# Patient Record
Sex: Female | Born: 1970
Health system: Southern US, Community
[De-identification: ages and names within clinical notes are randomized; demographics above are authoritative.]

## PROBLEM LIST (undated history)

## (undated) DIAGNOSIS — I959 Hypotension, unspecified: Secondary | ICD-10-CM

## (undated) DIAGNOSIS — E785 Hyperlipidemia, unspecified: Secondary | ICD-10-CM

## (undated) DIAGNOSIS — A159 Respiratory tuberculosis unspecified: Secondary | ICD-10-CM

## (undated) DIAGNOSIS — F419 Anxiety disorder, unspecified: Secondary | ICD-10-CM

## (undated) DIAGNOSIS — B9681 Helicobacter pylori [H. pylori] as the cause of diseases classified elsewhere: Secondary | ICD-10-CM

## (undated) DIAGNOSIS — R87619 Unspecified abnormal cytological findings in specimens from cervix uteri: Secondary | ICD-10-CM

## (undated) DIAGNOSIS — T7840XA Allergy, unspecified, initial encounter: Secondary | ICD-10-CM

## (undated) DIAGNOSIS — M797 Fibromyalgia: Secondary | ICD-10-CM

## (undated) DIAGNOSIS — F329 Major depressive disorder, single episode, unspecified: Secondary | ICD-10-CM

## (undated) DIAGNOSIS — F32A Depression, unspecified: Secondary | ICD-10-CM

## (undated) DIAGNOSIS — K253 Acute gastric ulcer without hemorrhage or perforation: Secondary | ICD-10-CM

## (undated) DIAGNOSIS — IMO0002 Reserved for concepts with insufficient information to code with codable children: Secondary | ICD-10-CM

## (undated) HISTORY — PX: UPPER GI ENDOSCOPY: SHX6162

## (undated) HISTORY — DX: Unspecified abnormal cytological findings in specimens from cervix uteri: R87.619

## (undated) HISTORY — DX: Reserved for concepts with insufficient information to code with codable children: IMO0002

## (undated) HISTORY — DX: Major depressive disorder, single episode, unspecified: F32.9

## (undated) HISTORY — DX: Depression, unspecified: F32.A

## (undated) HISTORY — PX: COLONOSCOPY: SHX174

## (undated) HISTORY — DX: Anxiety disorder, unspecified: F41.9

## (undated) HISTORY — PX: GYNECOLOGIC CRYOSURGERY: SHX857

## (undated) HISTORY — DX: Allergy, unspecified, initial encounter: T78.40XA

## (undated) HISTORY — DX: Respiratory tuberculosis unspecified: A15.9

---

## 1998-10-24 ENCOUNTER — Emergency Department (HOSPITAL_COMMUNITY): Admission: EM | Admit: 1998-10-24 | Discharge: 1998-10-24 | Payer: Self-pay | Admitting: Emergency Medicine

## 1999-08-20 ENCOUNTER — Other Ambulatory Visit: Admission: RE | Admit: 1999-08-20 | Discharge: 1999-08-20 | Payer: Self-pay | Admitting: Obstetrics and Gynecology

## 1999-09-10 ENCOUNTER — Other Ambulatory Visit: Admission: RE | Admit: 1999-09-10 | Discharge: 1999-09-10 | Payer: Self-pay | Admitting: Obstetrics and Gynecology

## 1999-09-10 ENCOUNTER — Encounter (INDEPENDENT_AMBULATORY_CARE_PROVIDER_SITE_OTHER): Payer: Self-pay | Admitting: Specialist

## 2000-01-15 ENCOUNTER — Other Ambulatory Visit: Admission: RE | Admit: 2000-01-15 | Discharge: 2000-01-15 | Payer: Self-pay | Admitting: Obstetrics and Gynecology

## 2000-02-21 ENCOUNTER — Other Ambulatory Visit: Admission: RE | Admit: 2000-02-21 | Discharge: 2000-02-21 | Payer: Self-pay | Admitting: Obstetrics and Gynecology

## 2000-02-21 ENCOUNTER — Encounter (INDEPENDENT_AMBULATORY_CARE_PROVIDER_SITE_OTHER): Payer: Self-pay | Admitting: Specialist

## 2000-03-12 ENCOUNTER — Inpatient Hospital Stay (HOSPITAL_COMMUNITY): Admission: AD | Admit: 2000-03-12 | Discharge: 2000-03-14 | Payer: Self-pay | Admitting: *Deleted

## 2001-06-08 ENCOUNTER — Other Ambulatory Visit: Admission: RE | Admit: 2001-06-08 | Discharge: 2001-06-08 | Payer: Self-pay | Admitting: *Deleted

## 2001-06-10 ENCOUNTER — Encounter: Payer: Self-pay | Admitting: *Deleted

## 2001-06-10 ENCOUNTER — Inpatient Hospital Stay (HOSPITAL_COMMUNITY): Admission: AD | Admit: 2001-06-10 | Discharge: 2001-06-10 | Payer: Self-pay | Admitting: *Deleted

## 2002-06-15 ENCOUNTER — Encounter: Admission: RE | Admit: 2002-06-15 | Discharge: 2002-06-15 | Payer: Self-pay | Admitting: Family Medicine

## 2002-06-22 ENCOUNTER — Encounter: Admission: RE | Admit: 2002-06-22 | Discharge: 2002-06-22 | Payer: Self-pay | Admitting: Family Medicine

## 2002-06-28 ENCOUNTER — Ambulatory Visit (HOSPITAL_COMMUNITY): Admission: RE | Admit: 2002-06-28 | Discharge: 2002-06-28 | Payer: Self-pay | Admitting: Family Medicine

## 2002-07-27 ENCOUNTER — Encounter: Admission: RE | Admit: 2002-07-27 | Discharge: 2002-07-27 | Payer: Self-pay | Admitting: Family Medicine

## 2002-09-05 ENCOUNTER — Encounter: Admission: RE | Admit: 2002-09-05 | Discharge: 2002-09-05 | Payer: Self-pay | Admitting: Family Medicine

## 2002-09-13 ENCOUNTER — Encounter: Payer: Self-pay | Admitting: Family Medicine

## 2002-09-13 ENCOUNTER — Ambulatory Visit (HOSPITAL_COMMUNITY): Admission: RE | Admit: 2002-09-13 | Discharge: 2002-09-13 | Payer: Self-pay | Admitting: Emergency Medicine

## 2002-10-03 ENCOUNTER — Encounter: Admission: RE | Admit: 2002-10-03 | Discharge: 2002-10-03 | Payer: Self-pay | Admitting: Family Medicine

## 2002-10-05 ENCOUNTER — Encounter: Admission: RE | Admit: 2002-10-05 | Discharge: 2002-10-05 | Payer: Self-pay | Admitting: Family Medicine

## 2002-11-04 ENCOUNTER — Encounter: Admission: RE | Admit: 2002-11-04 | Discharge: 2002-11-04 | Payer: Self-pay | Admitting: Family Medicine

## 2002-11-15 ENCOUNTER — Encounter: Admission: RE | Admit: 2002-11-15 | Discharge: 2002-11-15 | Payer: Self-pay | Admitting: Pediatrics

## 2002-12-02 ENCOUNTER — Encounter: Admission: RE | Admit: 2002-12-02 | Discharge: 2002-12-02 | Payer: Self-pay | Admitting: Family Medicine

## 2002-12-09 ENCOUNTER — Encounter: Admission: RE | Admit: 2002-12-09 | Discharge: 2002-12-09 | Payer: Self-pay | Admitting: Family Medicine

## 2002-12-22 ENCOUNTER — Encounter: Admission: RE | Admit: 2002-12-22 | Discharge: 2002-12-22 | Payer: Self-pay | Admitting: Family Medicine

## 2002-12-30 ENCOUNTER — Encounter: Admission: RE | Admit: 2002-12-30 | Discharge: 2002-12-30 | Payer: Self-pay | Admitting: Family Medicine

## 2003-01-02 ENCOUNTER — Inpatient Hospital Stay (HOSPITAL_COMMUNITY): Admission: AD | Admit: 2003-01-02 | Discharge: 2003-01-04 | Payer: Self-pay | Admitting: Family Medicine

## 2003-01-05 ENCOUNTER — Encounter: Admission: RE | Admit: 2003-01-05 | Discharge: 2003-02-04 | Payer: Self-pay | Admitting: Family Medicine

## 2006-03-10 ENCOUNTER — Ambulatory Visit: Payer: Self-pay | Admitting: Sports Medicine

## 2006-03-17 ENCOUNTER — Ambulatory Visit: Payer: Self-pay | Admitting: Sports Medicine

## 2006-04-03 ENCOUNTER — Ambulatory Visit: Payer: Self-pay | Admitting: Family Medicine

## 2006-04-15 ENCOUNTER — Ambulatory Visit: Payer: Self-pay | Admitting: Sports Medicine

## 2006-04-24 ENCOUNTER — Ambulatory Visit (HOSPITAL_COMMUNITY): Admission: RE | Admit: 2006-04-24 | Discharge: 2006-04-24 | Payer: Self-pay | Admitting: Family Medicine

## 2006-05-01 ENCOUNTER — Ambulatory Visit: Payer: Self-pay | Admitting: Sports Medicine

## 2006-05-28 ENCOUNTER — Ambulatory Visit: Payer: Self-pay | Admitting: Family Medicine

## 2006-06-08 ENCOUNTER — Ambulatory Visit: Payer: Self-pay | Admitting: Family Medicine

## 2006-06-30 ENCOUNTER — Ambulatory Visit: Payer: Self-pay | Admitting: Family Medicine

## 2006-07-07 ENCOUNTER — Ambulatory Visit: Payer: Self-pay | Admitting: Family Medicine

## 2006-07-30 ENCOUNTER — Ambulatory Visit: Payer: Self-pay | Admitting: Family Medicine

## 2006-08-17 ENCOUNTER — Ambulatory Visit: Payer: Self-pay | Admitting: Family Medicine

## 2006-09-02 ENCOUNTER — Ambulatory Visit: Payer: Self-pay | Admitting: Family Medicine

## 2006-09-03 ENCOUNTER — Ambulatory Visit: Payer: Self-pay | Admitting: Sports Medicine

## 2006-09-16 ENCOUNTER — Ambulatory Visit: Payer: Self-pay | Admitting: Family Medicine

## 2006-09-24 ENCOUNTER — Ambulatory Visit: Payer: Self-pay | Admitting: Family Medicine

## 2006-09-30 ENCOUNTER — Ambulatory Visit: Payer: Self-pay | Admitting: Obstetrics and Gynecology

## 2006-09-30 ENCOUNTER — Ambulatory Visit: Payer: Self-pay | Admitting: Obstetrics & Gynecology

## 2006-10-01 ENCOUNTER — Encounter (INDEPENDENT_AMBULATORY_CARE_PROVIDER_SITE_OTHER): Payer: Self-pay | Admitting: Specialist

## 2006-10-01 ENCOUNTER — Inpatient Hospital Stay (HOSPITAL_COMMUNITY): Admission: RE | Admit: 2006-10-01 | Discharge: 2006-10-04 | Payer: Self-pay | Admitting: Family Medicine

## 2006-10-01 ENCOUNTER — Ambulatory Visit: Payer: Self-pay | Admitting: Family Medicine

## 2006-10-05 ENCOUNTER — Inpatient Hospital Stay (HOSPITAL_COMMUNITY): Admission: AD | Admit: 2006-10-05 | Discharge: 2006-10-05 | Payer: Self-pay | Admitting: Gynecology

## 2006-11-06 ENCOUNTER — Ambulatory Visit: Payer: Self-pay | Admitting: Family Medicine

## 2006-11-06 ENCOUNTER — Encounter: Payer: Self-pay | Admitting: Family Medicine

## 2008-02-07 ENCOUNTER — Encounter: Admission: RE | Admit: 2008-02-07 | Discharge: 2008-02-07 | Payer: Self-pay | Admitting: Family Medicine

## 2009-11-28 ENCOUNTER — Ambulatory Visit: Payer: Self-pay | Admitting: Obstetrics and Gynecology

## 2010-06-20 ENCOUNTER — Ambulatory Visit (HOSPITAL_COMMUNITY): Admission: RE | Admit: 2010-06-20 | Discharge: 2010-06-20 | Payer: Self-pay | Admitting: Family Medicine

## 2010-11-25 ENCOUNTER — Other Ambulatory Visit: Payer: Self-pay | Admitting: Family Medicine

## 2010-11-26 ENCOUNTER — Ambulatory Visit
Admission: RE | Admit: 2010-11-26 | Discharge: 2010-11-26 | Disposition: A | Discharge status: Home / Self Care | Payer: No Typology Code available for payment source | Source: Ambulatory Visit | Attending: Family Medicine | Admitting: Family Medicine

## 2010-12-23 LAB — POCT PREGNANCY, URINE: Preg Test, Ur: NEGATIVE

## 2011-02-14 NOTE — Discharge Summary (Signed)
NAMECHARNEE, Aimee Figueroa                 ACCOUNT NO.:  [id number]   MEDICAL RECORD NO.:  [id number]          PATIENT TYPE:  INP   LOCATION:  9116                          FACILITY:  [facility]   PHYSICIAN:  Tanya S. Shawnie Pons, M.D.   DATE OF BIRTH:  11/04/1970   DATE OF ADMISSION:  10/01/2006  DATE OF DISCHARGE:  10/04/2006                               DISCHARGE SUMMARY   PRIMARY CARE PHYSICIAN:  Broadus John T. Pamalee Leyden, M.D.   ADMISSION DIAGNOSES:  1. Gravida 6, para 3-0-2-3 female with viable intrauterine pregnancy      at 40-5/7 weeks.  2. Breech presentation.  3. Active labor.   DISCHARGE DIAGNOSES:  1. Term pregnancy, delivered.  2. Breech presentation converted to cephalic, delivered via a primary      low transverse cesarean section.  3. Postpartum anemia.  4. Postpartum hypertension.  5. Postpartum fever.   PROCEDURES:  1. The patient had a primary low transverse cesarean section performed      on October 01, 2006 at 11:15 a.m. by Dr. Kristen Loader.  Please see      the dictated operative report.  Estimated blood loss during the      procedure was 700 mL.  2. The patient had a pelvic ultrasound performed on October 03, 2006      which showed no incisional hematoma.  It did show some subcutaneous      edema.  It also showed a postpartum uterus with echogenic material      in the endometrial canal consistent with blood clots.   LABORATORY DATA:  Admission hemoglobin was 14.1.  Hemoglobin on  postpartum day #1 was 8.9.  This trended down to 8.5, 8.2, then 7.2, and  on the day of discharge was 7.6 and stable.  White blood cell count was  7.6, with a normal platelet count of 159.   MICROBIOLOGY:  The patient had blood cultures obtained.  They were no  growth at 8 x2.  A urine culture obtained was no growth to date.   HISTORY OF PRESENT ILLNESS:  The patient is a 40 year old G6, P3-0-2-3  who was admitted at 40-5/7 weeks based on her last menstrual period.  She had been followed at the  [facility] and was  set up for post-dates surveillance.  She presented to [facility]  on September 30, 2006 at which time a biophysical profile and nonstress  test were performed.  At that time, the baby was found to be in a breech  presentation on the biophysical profile.  She was then scheduled for  elective C-section on October 01, 2006 at 12 o'clock for breech  presentation and post-dates.  However, on the morning of October 01, 2006, she presented to [facility] with strong contractions.  They  were occurring every 3 to 9 minutes.  On her cervical exam at that time,  she was found to be 3, 60, and -3.  She was admitted in early labor, and  we will proceed with her scheduled C-section.   HOSPITAL COURSE:  Problem 1.  Breech presentation.  The patient was  taken to the operating room by Dr. Kristen Loader.  A low transverse  cesarean section was performed at that time.  Please see the dictated  surgical note.  A viable female infant was born with Apgars of 8 at 1  and 9 at 5 minutes.  Birth weight was 8 pounds 0 ounces.  Cord gas was  7.28.  The patient tolerated the procedure well.  Estimated blood loss  was 700 mL.  She was taken to the PACU in stable condition and then  transferred to the floor.  On postoperative day #1, the patient had a  fever of 101.  At that time cultures were obtained, and the patient was  monitored.  No antibiotics were started.  She remained afebrile  throughout the remainder of her hospital course on postoperative day #2  as well as postoperative day #3.  Her white count was 7.6.  Blood  cultures were obtained, and they were no growth to date x2.  A urine  culture was obtained that was negative.  It was felt that this was  likely a postoperative fever secondary to a hematoma and that no  antibiotics were warranted.   Problem 2.  Postpartum anemia.  The patient's hemoglobin upon admission  was 14.  Her first postoperative  hemoglobin was 8.9.  This trended down  to 8.5, 8.2, to 7.2, and on the day of discharge was 7.6.  The patient  was having very minimal lochia which essentially resolved.  An  ultrasound was obtained on postoperative day #2 looking for an  incisional hematoma.  This was negative and only showed subcutaneous  edema but no incisional hematoma.  It did show some blood clots in the  endometrial canal but nothing that warranted further operative  management.  Part of the decline in her hemoglobin was felt to be  secondary to a dilutional component, as she received approximately 2 L  of normal saline fluid on the day in which her blood count dropped from  8.2 to 7.2.  After having not received any IV fluids for over 24 hours,  her hemoglobin had come back up to 7.6, so again, it was felt that this  was likely also dilutional as well.  She had declined blood transfusion  throughout the hospitalization and elected to pursue p.o. iron therapy  and was discharged home on iron sulfate 325 mg p.o. b.i.d.  She was  instructed that if she became increasingly fatigued, had worsening  bleeding, any chest pain, shortness of breath, or syncopal/presyncopal  events to return immediately to [facility] or the [facility].   Problem 3.  Postpartum depression.  The patient was screened in the  hospital for postpartum depression.  She had postpartum depression with  her previous 3 births.  She also had been treated during this pregnancy  with Wellbutrin for a few weeks which she had electively discontinued on  her own accord.  At the present time, she does not want to start any  medical therapy for postpartum depression.  She understands that if she  has anything beyond the normal baby blues that she has experienced, to  call the [facility] immediately, and we will be very aggressive in treating her for postpartum depression.   DISCHARGE MEDICATIONS:  1.  Iron sulfate 325 mg p.o. b.i.d.  2. Senokot-S 2 tablets p.o. q.h.s. p.r.n. constipation.  3. Oxycodone 5 mg tablets,  1 to 2 tablets p.o. q.4h. p.r.n. pain.  4. Motrin 800 mg 1 p.o. q.8h. p.r.n. pain.   DISCHARGE INSTRUCTIONS AND FOLLOWUP:  The patient is instructed with  follow up with the [facility] in 6 weeks for her  postpartum visit.  At that time we will do a pelvic examination.  Barring any abnormalities, the patient has elected to proceed with an  IUD as her form of contraception, and this will be ordered at the 6-week  visit and then placed shortly thereafter.  She is instructed to refrain  from sexual intercourse for the next 6 weeks, to refrain from driving  for the next 10 days.  She is instructed to refrain from lifting  anything heavier than her baby until her postpartum visit in 6 weeks.  She expressed and understanding of these stipulations.     Broadus John T. Pamalee Leyden, MD    ______________________________  Shelbie Proctor. Shawnie Pons, M.D.   Alvia Grove  D:  10/04/2006  T:  10/04/2006  Job:  [id number]

## 2011-02-14 NOTE — Op Note (Signed)
NAME:  Aimee Figueroa, Aimee Figueroa                 ACCOUNT NO.:  [id number]   MEDICAL RECORD NO.:  [id number]          PATIENT TYPE:  INP   LOCATION:                                FACILITY:  [facility]   PHYSICIAN:  Phil D. Okey Dupre, M.D.     DATE OF BIRTH:  [birth year]   DATE OF PROCEDURE:  10/01/2006  DATE OF DISCHARGE:                               OPERATIVE REPORT   PREOPERATIVE DIAGNOSIS:  IUP at 40 weeks and 5 days gestation, breech  presentation.   POSTOPERATIVE DIAGNOSIS:  Term pregnancy delivered via cesarean section,  breech presentation, converted to cephalic presentation.   PROCEDURE:  Primary lower transverse cesarean section.   SURGEON:  Dr. Argentina Donovan.   ASSISTANT:  Paticia Stack, M.D.   ANESTHESIA:  Spinal.   SPECIMENS:  Placenta sent to pathology.   ESTIMATED BLOOD LOSS:  700 mL.   COMPLICATIONS:  None.   FINDINGS:  A female infant in cephalic presentation, Apgars were 8 and  9, birth weight 8 pounds 0 ounces, cord pH 7.28.   INDICATIONS FOR PROCEDURE:  This is a 40 year old gravida 6, para 3-0-2-  3 who presented at 40 weeks 5 days gestation with complaints of  contractions.  The patient had been scheduled for a primary cesarean  section secondary to breech presentation on the prior to delivery.   PROCEDURE:  The patient was taken to the operating room where spinal  anesthesia was found to be adequate.  She was then prepped and draped in  a normal sterile fashion in the dorsal supine position with a leftward  tilt.  The Pfannenstiel skin incision was then made with a scalpel and  carried through to the underlying layer of fascia.  The fascia was  incised in the midline and the incision extended laterally with the Mayo  scissors.  The superior aspect of the fascial incision was then grasped  with the Kocher clamps, elevated and the underlying rectus muscles  dissected off bluntly.  Attention was then turned to the inferior aspect  of the incision which in a similar fashion was  grasped, tented up with  Kocher clamps and the rectus muscles dissected off bluntly.  The rectus  muscles were then separated in the midline and the peritoneum  identified, tented up and entered sharply with Metzenbaum scissors.  The  peritoneal incision was then extended superiorly and inferiorly with  good visualization of the bladder.  The bladder blade was then inserted  and the vesicouterine peritoneum identified, grasped with the pickups  and entered sharply with Metzenbaum scissors.  This incision was then  extended laterally and the bladder flap created digitally.  The bladder  blade was then reinserted and the lower uterine segment incised in a  transverse fashion with a scalpel.  The bladder blade was removed.  The  infant was found to be in the cephalic position and the infant's head  delivered atraumatically.  There was a nuchal cord x1 that was reduced.  The nose and mouth were suctioned and the cord clamped and cut.  The  infant was handed  off to the waiting pediatricians.  Cord blood and cord  gases were obtained.  The placenta was then removed manually.  It was  noted to have a hyper-spiral cord.  The uterus was cleared of all clots  and debris.  The uterine incision was repaired with 1-0 chromic in a  running locked fashion.  Multiple figure-of-eight stitches were used to  reinforce the uterine incision with excellent hemostasis.  The gutters  were cleared of all clots, the incision reinspected with excellent  hemostasis noted.  The fascia was reapproximated with 0 Vicryl in a  running fashion.  The skin was closed with staples.  The patient  tolerated the procedure well.  Sponge, lap and needle counts were  correct x2.  The patient was taken to the recovery room in stable  condition.     ______________________________  Paticia Stack, MD    ______________________________  Javier Glazier Okey Dupre, M.D.    LNJ/MEDQ  D:  10/01/2006  T:  10/01/2006  Job:  [id number]

## 2011-09-10 ENCOUNTER — Other Ambulatory Visit: Payer: Self-pay

## 2011-09-10 DIAGNOSIS — Z331 Pregnant state, incidental: Secondary | ICD-10-CM

## 2011-09-10 NOTE — Progress Notes (Signed)
Prenatal labs done today Aimee Figueroa 

## 2011-09-11 LAB — OBSTETRIC PANEL
Antibody Screen: NEGATIVE
Basophils Relative: 0 % (ref 0–1)
Eosinophils Absolute: 0 10*3/uL (ref 0.0–0.7)
HCT: 37.8 % (ref 36.0–46.0)
Hepatitis B Surface Ag: NEGATIVE
Monocytes Absolute: 0.2 10*3/uL (ref 0.1–1.0)
RBC: 4 MIL/uL (ref 3.87–5.11)
Rh Type: POSITIVE
Rubella: >500 IU/mL — ABNORMAL HIGH
WBC: 5.3 10*3/uL (ref 4.0–10.5)

## 2011-09-11 LAB — HIV ANTIBODY (ROUTINE TESTING W REFLEX): HIV: NONREACTIVE

## 2011-09-12 LAB — CULTURE, OB URINE: Colony Count: NO GROWTH

## 2011-09-17 ENCOUNTER — Encounter: Payer: Self-pay | Admitting: Family Medicine

## 2011-09-17 ENCOUNTER — Other Ambulatory Visit (HOSPITAL_COMMUNITY)
Admission: RE | Admit: 2011-09-17 | Discharge: 2011-09-17 | Disposition: A | Discharge status: Home / Self Care | Payer: Self-pay | Source: Ambulatory Visit | Attending: Family Medicine | Admitting: Family Medicine

## 2011-09-17 ENCOUNTER — Ambulatory Visit (INDEPENDENT_AMBULATORY_CARE_PROVIDER_SITE_OTHER): Payer: Self-pay | Admitting: Family Medicine

## 2011-09-17 VITALS — BP 93/61 | Wt 135.0 lb

## 2011-09-17 DIAGNOSIS — Z01419 Encounter for gynecological examination (general) (routine) without abnormal findings: Secondary | ICD-10-CM | POA: Insufficient documentation

## 2011-09-17 DIAGNOSIS — Z3480 Encounter for supervision of other normal pregnancy, unspecified trimester: Secondary | ICD-10-CM

## 2011-09-17 DIAGNOSIS — Z9889 Other specified postprocedural states: Secondary | ICD-10-CM

## 2011-09-17 DIAGNOSIS — Z331 Pregnant state, incidental: Secondary | ICD-10-CM

## 2011-09-17 DIAGNOSIS — Z98891 History of uterine scar from previous surgery: Secondary | ICD-10-CM

## 2011-09-17 DIAGNOSIS — O09529 Supervision of elderly multigravida, unspecified trimester: Secondary | ICD-10-CM

## 2011-09-17 MED ORDER — ERYTHROMYCIN 5 MG/GM OP OINT: TOPICAL_OINTMENT | Freq: Every day | OPHTHALMIC | Status: AC

## 2011-09-17 NOTE — Progress Notes (Signed)
All prenatal labs and urine reviewed.

## 2011-09-17 NOTE — Progress Notes (Signed)
Addended by: Edd Arbour on: 09/17/2011 11:02 AM   Modules accepted: Kipp Brood

## 2011-09-17 NOTE — Progress Notes (Signed)
New OB S: no complaints. Some GERD. No morning sickness.  O: Filed Vitals:   09/17/11 0952  BP: 93/61  Weight: 135 lb (61.236 kg)  Lungs:  Normal respiratory effort, chest expands symmetrically. Lungs are clear to auscultation, no crackles or wheezes. Abdomen: soft and non-tender without masses, she has a Gravid uterus, organomegaly or hernias noted.  No guarding or rebound Extremities:  No cyanosis, edema, or deformity noted  Skin:  Intact without suspicious lesions or rashes  A/P 14.2 pregnant female 5th pregnancy.  - she declines genetic screening after extensive counseling - she is 40 y/o - she will be set up for a repeat C-Section and tubal ligation. - she plans to breast feed - she will RTC in one month.

## 2011-09-18 LAB — GC/CHLAMYDIA PROBE AMP, GENITAL: Chlamydia, DNA Probe: NEGATIVE

## 2011-09-30 NOTE — L&D Delivery Note (Signed)
Delivery Note At  a viable female delivered via SVD .  Presentation: vertex; Position: face facing left. Mild dystocia noted without nuchal cord.  Delivery of the head:  Right shoulder delivered by hooking finger under axilla and moving head anterior. Baby delivered to mothers chest.  Cord clamped and cut. Cord blood sent.  Weight: 8 lbs 7 oz APGAR: not recorded, 5 minutes: 8; Placenta status: intact, .   Cord: 3 vessel  Anesthesia: Epidural  Episiotomy: none Lacerations: none Suture Repair: none needed Est. Blood Loss (mL): 300 cc  Mom to postpartum.  Baby to nursery-stable.  Edd Arbour MD 03/11/2012, 12:20 PM    Due to precipitous progression, I was not present for the delivery of this patient despite nursing attempt to call me.  I arrived approximately 5 minutes after the delivery.  Patient examined - agree with the above note.  Levie Heritage, DO. 03/11/2012 3:18 PM

## 2011-10-01 DIAGNOSIS — O09529 Supervision of elderly multigravida, unspecified trimester: Secondary | ICD-10-CM | POA: Insufficient documentation

## 2011-10-01 DIAGNOSIS — Z98891 History of uterine scar from previous surgery: Secondary | ICD-10-CM | POA: Insufficient documentation

## 2011-10-01 NOTE — Progress Notes (Signed)
Note reviewed. Pt due for flu shot. Also needs early glucola for +FH diabetes (father). Will need dating ultrasound if she was taking OCPs at conception.

## 2011-10-14 ENCOUNTER — Ambulatory Visit (INDEPENDENT_AMBULATORY_CARE_PROVIDER_SITE_OTHER): Payer: Self-pay | Admitting: Family Medicine

## 2011-10-14 VITALS — BP 105/53 | Wt 140.6 lb

## 2011-10-14 DIAGNOSIS — Z331 Pregnant state, incidental: Secondary | ICD-10-CM

## 2011-10-14 NOTE — Progress Notes (Signed)
S: no complaints. No n/v O: Filed Vitals:   10/14/11 1556  BP: 105/53  Weight: 140 lb 9.6 oz (63.776 kg)  Lungs:  Normal respiratory effort, chest expands symmetrically. Lungs are clear to auscultation, no crackles or wheezes. Abdomen: soft and non-tender without masses, she has a Gravid uterus, organomegaly or hernias noted.  No guarding or rebound Extremities:  No cyanosis, edema, or deformity noted  Skin:  Intact without suspicious lesions or rashes  A/P 18.1 pregnant female 5th pregnancy.  - she declines genetic screening after extensive counseling - she is 41 y/o - she will be set up for a repeat C-Section and tubal ligation. - she plans to breast feed - she will RTC in one month. - 1 hr gtt today - she will think about whether she wants an u/s

## 2011-11-10 ENCOUNTER — Ambulatory Visit (INDEPENDENT_AMBULATORY_CARE_PROVIDER_SITE_OTHER): Payer: Self-pay | Admitting: Family Medicine

## 2011-11-10 VITALS — BP 106/60 | Wt 141.0 lb

## 2011-11-10 DIAGNOSIS — Z3480 Encounter for supervision of other normal pregnancy, unspecified trimester: Secondary | ICD-10-CM

## 2011-11-10 DIAGNOSIS — Z331 Pregnant state, incidental: Secondary | ICD-10-CM

## 2011-11-10 NOTE — Progress Notes (Signed)
S: no complaints. No n/v. Complains of some vaginal engorgement, no infection.  O: Filed Vitals:   11/10/11 0935  BP: 106/60  Weight: 141 lb (63.957 kg)  Lungs:  Normal respiratory effort, chest expands symmetrically. Lungs are clear to auscultation, no crackles or wheezes. Abdomen: soft and non-tender without masses, she has a Gravid uterus, organomegaly or hernias noted.  No guarding or rebound Extremities:  No cyanosis, edema, or deformity noted  Skin:  Intact without suspicious lesions or rashes  A/P 68 pregnant female 5th pregnancy.  - she declines genetic screening after extensive counseling - she is 41 y/o - she will be set up for a repeat C-Section and tubal ligation. - she plans to breast feed - she will RTC in one month. - gtt normal - will schedule anatomy ultrasound

## 2011-12-11 ENCOUNTER — Ambulatory Visit (INDEPENDENT_AMBULATORY_CARE_PROVIDER_SITE_OTHER): Payer: Self-pay | Admitting: Family Medicine

## 2011-12-11 VITALS — BP 104/60 | Wt 146.3 lb

## 2011-12-11 DIAGNOSIS — Z3480 Encounter for supervision of other normal pregnancy, unspecified trimester: Secondary | ICD-10-CM

## 2011-12-11 NOTE — Progress Notes (Signed)
S: no complaints. No n/v. minimal pain.  O: Filed Vitals:   12/11/11 0943  BP: 104/60  Weight: 146 lb 4.8 oz (66.361 kg)  Lungs:  Normal respiratory effort, chest expands symmetrically. Lungs are clear to auscultation, no crackles or wheezes. Abdomen: soft and non-tender without masses, she has a Gravid uterus, organomegaly or hernias noted.  No guarding or rebound Extremities:  No cyanosis, edema, or deformity noted  Skin:  Intact without suspicious lesions or rashes  A/P 26.3 pregnant female 5th pregnancy.  - she declines genetic screening after extensive counseling - she is 41 y/o - she will be set up for a repeat C-Section and tubal ligation. - she plans to breast feed - she will RTC in 2 weeks.  - gtt normal - patient does not want anatomy ultrasound for cost reasons.  - RPR/CBC/HIV ordered for two weeks.   - 1 hour GTT in 2 week.

## 2011-12-16 NOTE — Progress Notes (Signed)
Pt also seems to be due for 1 hour glucola (had early glucola for +FH diabetes, which she passed). Due for Tdap, if she has not received one this pregnancy.

## 2011-12-31 ENCOUNTER — Encounter: Payer: Self-pay | Admitting: Family Medicine

## 2012-01-02 ENCOUNTER — Ambulatory Visit (INDEPENDENT_AMBULATORY_CARE_PROVIDER_SITE_OTHER): Payer: Self-pay | Admitting: Family Medicine

## 2012-01-02 ENCOUNTER — Other Ambulatory Visit: Payer: Self-pay

## 2012-01-02 VITALS — BP 97/55 | Temp 97.8°F | Wt 147.5 lb

## 2012-01-02 DIAGNOSIS — Z3480 Encounter for supervision of other normal pregnancy, unspecified trimester: Secondary | ICD-10-CM

## 2012-01-02 DIAGNOSIS — Z23 Encounter for immunization: Secondary | ICD-10-CM

## 2012-01-02 LAB — CBC
HCT: 36.6 % (ref 36.0–46.0)
Hemoglobin: 11.9 g/dL — ABNORMAL LOW (ref 12.0–15.0)
MCV: 98.4 fL (ref 78.0–100.0)

## 2012-01-02 MED ORDER — ERYTHROMYCIN 5 MG/GM OP OINT: TOPICAL_OINTMENT | Freq: Every day | OPHTHALMIC | Status: AC

## 2012-01-02 NOTE — Progress Notes (Signed)
Addended by: Jimmy Footman K on: 01/02/2012 12:17 PM   Modules accepted: Orders

## 2012-01-02 NOTE — Progress Notes (Signed)
Discussed further with patient. She does not want a c-section or tubal because she cannot afford it. She will attempt vaginal delivery.

## 2012-01-02 NOTE — Progress Notes (Signed)
S: no complaints.  O: Filed Vitals:   01/02/12 0848  BP: 97/55  Temp: 97.8 F (36.6 C)  Weight: 147 lb 8 oz (66.906 kg)  Abdomen: soft and non-tender without masses, she has a Gravid uterus, organomegaly or hernias noted.  No guarding or rebound Extremities:  No cyanosis, edema, or deformity noted  Skin:  Intact without suspicious lesions or rashes  A/P 29.4 pregnant female 5th pregnancy.  - she declines genetic screening after extensive counseling - she is 41 y/o - she will be set up for a repeat C-Section and tubal ligation. - she plans to breast feed - she will RTC in 2 weeks.  - patient does not want anatomy ultrasound for cost reasons.  - RPR/CBC/HIV today  - 1 hour GTT 111, no 3 hour needed. -reviewed prenatal visit schedule for items to do.

## 2012-01-02 NOTE — Progress Notes (Signed)
Addended by: Edd Arbour on: 01/02/2012 09:06 AM   Modules accepted: Orders

## 2012-01-02 NOTE — Progress Notes (Signed)
1 hr done. 3 hr scheduled for 01/06/12.Aimee Figueroa, [name]

## 2012-01-03 LAB — RPR

## 2012-01-03 LAB — HIV ANTIBODY (ROUTINE TESTING W REFLEX): HIV: NONREACTIVE

## 2012-01-06 ENCOUNTER — Other Ambulatory Visit: Payer: Self-pay

## 2012-01-06 DIAGNOSIS — Z331 Pregnant state, incidental: Secondary | ICD-10-CM

## 2012-01-06 LAB — GLUCOSE, CAPILLARY

## 2012-01-06 NOTE — Progress Notes (Unsigned)
3 hr gtt done today Aimee Figueroa 

## 2012-01-07 LAB — GLUCOSE TOLERANCE, 3 HOURS: Glucose Tolerance, Fasting: 80 mg/dL (ref 70–104)

## 2012-01-08 NOTE — Progress Notes (Signed)
Note reviewed. Elevated 1 hour, 3 hour is normal. H/o section - seems to desire TOL.  Has appt 4/17 at 9:30.

## 2012-01-14 ENCOUNTER — Encounter: Payer: Self-pay | Admitting: Family Medicine

## 2012-01-16 ENCOUNTER — Encounter: Payer: Self-pay | Admitting: Family Medicine

## 2012-01-19 ENCOUNTER — Telehealth: Payer: Self-pay | Admitting: Family Medicine

## 2012-01-19 NOTE — Telephone Encounter (Signed)
Pt was told that she was supposed to go to [facility] for an appt and they made the appt, but then called her back to cancel it - says she didn't need to go. Was told that [facility] was supposed to call her and hasn't heard anything else - not sure what to do,

## 2012-01-20 NOTE — Telephone Encounter (Signed)
Pt given the # for the [facility] so she can call [facility] back, states this is regarding talking about her delivery options.

## 2012-01-23 ENCOUNTER — Ambulatory Visit (INDEPENDENT_AMBULATORY_CARE_PROVIDER_SITE_OTHER): Payer: Self-pay | Admitting: Family Medicine

## 2012-01-23 DIAGNOSIS — Z3480 Encounter for supervision of other normal pregnancy, unspecified trimester: Secondary | ICD-10-CM

## 2012-01-23 NOTE — Patient Instructions (Signed)
Use Claritin pills to help with sneezing, itchy eyes, itchy throat  Non medicine things that can help: HEPA air filter in your bedroom, avoiding outdoors, nasal saline irrigation once to twice daily.

## 2012-01-23 NOTE — Progress Notes (Signed)
Work in appt for seasonal allergies  Has history of seasonal allergies, wants to know what she can take in pregnancy. Itchy eyes, itchy nose, itchy throat, sneezing.  Has not tried anything OTC.  PE: alert, NAD, nasal congestion and sniffling.  Allergic conjunctivitis.  A/P Seasonal allergies- advised claritin, nasal saline irrigation ,and environmental control.

## 2012-01-29 ENCOUNTER — Ambulatory Visit (INDEPENDENT_AMBULATORY_CARE_PROVIDER_SITE_OTHER): Payer: Self-pay | Admitting: Family Medicine

## 2012-01-29 VITALS — BP 102/59 | Temp 97.1°F | Wt 149.7 lb

## 2012-01-29 DIAGNOSIS — Z3480 Encounter for supervision of other normal pregnancy, unspecified trimester: Secondary | ICD-10-CM

## 2012-01-29 MED ORDER — LORATADINE 10 MG PO TABS: 10.0000 mg | ORAL_TABLET | Freq: Every day | ORAL | Status: DC

## 2012-01-29 NOTE — Progress Notes (Signed)
S: allergic rhinitis  O: Filed Vitals:   01/29/12 1057  BP: 102/59  Temp: 97.1 F (36.2 C)  Weight: 149 lb 11.2 oz (67.903 kg)  Abdomen: soft and non-tender without masses, she has a Gravid uterus, organomegaly or hernias noted.  No guarding or rebound Extremities:  No cyanosis, edema, or deformity noted  Skin:  Intact without suspicious lesions or rashes  A/P 33.3 pregnant female 5th pregnancy.  - she will be set up for a repeat C-Section and tubal ligation. - she plans to breast feed - she will RTC in 2 weeks.  -reviewed prenatal visit schedule for items to do.

## 2012-02-03 ENCOUNTER — Encounter: Payer: Self-pay | Admitting: Family Medicine

## 2012-02-03 DIAGNOSIS — Z98891 History of uterine scar from previous surgery: Secondary | ICD-10-CM

## 2012-02-03 NOTE — Patient Instructions (Signed)

## 2012-02-03 NOTE — Progress Notes (Signed)
Patient ID: Aimee Figueroa, female   DOB: [birth year], 41 y.o.   MRN: [id number] Pt. Is a 41 y.o. [id number] 2 [redacted]w[redacted]d with 3 prev. SVD and on LTCS for breech at term. After lengthy discussion, pt. Desires TOLAC. 41 y.o. at [redacted]w[redacted]d with Estimated Date of Delivery: 03/15/12 was seen today in office to discuss VBAC versus repeat cesarean section.   The following risks were discussed with the patient.  Risk of uterine rupture at term is 0.78 percent with TOLAC and 0.22 percent with ERCD. 1 in 10 uterine ruptures will result in neonatal death or neurological injury. The benefits of a trial of labor after cesarean (TOLAC) resulting in a vaginal birth after cesarean (VBAC) include the following: shorter length of hospital stay and postpartum recovery (in most cases); fewer complications, such as postpartum fever, wound or uterine infection, thromboembolism (blood clots in the leg or lung), need for blood transfusion and fewer neonatal breathing problems. The risks of an attempted VBAC or TOLAC include the following: Risk of failed trial of labor after cesarean (TOLAC) without a vaginal birth after cesarean (VBAC) resulting in repeat cesarean delivery (RCD) in about 20 to 40 percent of women who attempt VBAC.  Risk of rupture of uterus resulting in an emergency cesarean delivery. The risk of uterine rupture may be related in part to the type of uterine incision made during the first cesarean delivery. A previous transverse uterine incision has the lowest risk of rupture (0.2 to 1.5 percent risk). Vertical or T-shaped uterine incisions have a higher risk of uterine rupture (4 to 9 percent risk)The risk of fetal death is very low with both VBAC and elective repeat cesarean delivery (ERCD), but the likelihood of fetal death is higher with VBAC than with ERCD. Maternal death is very rare with either type of delivery.  The risks of a repeat cesarean section were reviewed with the patient including but not limited to: 10/998 risk  of uterine rupture which could have serious consequences, bleeding which may require transfusion; infection which may require antibiotics; injury to bowel, bladder or other surrounding organs (bowel, bladder, ureters); injury to the fetus; need for additional procedures including hysterectomy in the event of a life-threatening hemorrhage; thromboembolic phenomenon, incisional problems and other postoperative/anesthesia complications.    All her questions answered and she signed a consent indicating a preference for TOLAC.  She desires 10 year IUD after delivery, may schedule in our clinic.

## 2012-02-12 ENCOUNTER — Ambulatory Visit (INDEPENDENT_AMBULATORY_CARE_PROVIDER_SITE_OTHER): Payer: Self-pay | Admitting: Family Medicine

## 2012-02-12 DIAGNOSIS — Z3480 Encounter for supervision of other normal pregnancy, unspecified trimester: Secondary | ICD-10-CM

## 2012-02-12 MED ORDER — FLUTICASONE PROPIONATE 50 MCG/ACT NA SUSP: 1.0000 | Freq: Every day | NASAL | Status: DC

## 2012-02-12 MED ORDER — PSEUDOEPHEDRINE HCL 60 MG PO TABS: 30.0000 mg | ORAL_TABLET | ORAL | Status: AC | PRN

## 2012-02-12 NOTE — Progress Notes (Signed)
S: allergic rhinitis - still having congestion.   O: Filed Vitals:   02/12/12 0909  BP: 97/54  Temp: 97.6 F (36.4 C)  Weight: 68.221 kg (150 lb 6.4 oz)  Abdomen: soft and non-tender without masses, she has a Gravid uterus, organomegaly or hernias noted.  No guarding or rebound Leopolds: head down.  Extremities:  No cyanosis, edema, or deformity noted  Skin:  Intact without suspicious lesions or rashes  A/P 35.3 pregnant female 5th pregnancy.  - Saw Dr. Shawnie Pons, plan is to try VBAC and for Paraguard vs. Tubal ligation.  - she plans to breast feed - she will RTC in 2 weeks.  -reviewed prenatal visit schedule for items to do.  - GBS, gc/cl/HIV/RPR next visit @ 37 weeks.  - flonase and short course of pseudafed.

## 2012-02-26 ENCOUNTER — Ambulatory Visit (INDEPENDENT_AMBULATORY_CARE_PROVIDER_SITE_OTHER): Payer: Self-pay | Admitting: Family Medicine

## 2012-02-26 VITALS — BP 106/63 | Temp 97.8°F | Wt 151.2 lb

## 2012-02-26 DIAGNOSIS — Z3480 Encounter for supervision of other normal pregnancy, unspecified trimester: Secondary | ICD-10-CM

## 2012-02-26 NOTE — Progress Notes (Signed)
S: patient somewhat tearful today, she is worried about the pain of pregnancy and worried that the baby may have something wrong with it because she didn't want to have the baby. She has a history of peri-partum and postpartum depression.   O: Filed Vitals:   02/26/12 0955  BP: 106/63  Temp: 97.8 F (36.6 C)  Weight: 151 lb 3.2 oz (68.584 kg)  Abdomen: soft and non-tender without masses, she has a Gravid uterus, organomegaly or hernias noted.  No guarding or rebound Ultrasound done today: head down.  Extremities:  No cyanosis, edema, or deformity noted  Skin:  Pruritic rash on both lower ext.   A/P 28.3 pregnant female 5th pregnancy.  - Saw Dr. Shawnie Pons, plan is to try VBAC and for Paraguard vs. Tubal ligation.  - she plans to breast feed - she will RTC in 1 weeks.  -reviewed prenatal visit schedule for items to do.  - GBS, gc/cl/HIV/RPR next visit @ 38 weeks patient did not have time today.  - Peripartum depression, does not want medication. Will follow.

## 2012-02-26 NOTE — Patient Instructions (Signed)
My telephone number: [phone]  Call me if:  Heavy contractions that are close together Vaginal bleeding Baby is not moving Water breaks If you think you need to go to the hospital.   See you next week.

## 2012-03-01 ENCOUNTER — Other Ambulatory Visit (HOSPITAL_COMMUNITY)
Admission: RE | Admit: 2012-03-01 | Discharge: 2012-03-01 | Disposition: A | Discharge status: Home / Self Care | Payer: Self-pay | Source: Ambulatory Visit | Attending: Family Medicine | Admitting: Family Medicine

## 2012-03-01 ENCOUNTER — Ambulatory Visit (INDEPENDENT_AMBULATORY_CARE_PROVIDER_SITE_OTHER): Payer: Self-pay | Admitting: Family Medicine

## 2012-03-01 VITALS — BP 106/67 | Temp 98.0°F | Wt 153.0 lb

## 2012-03-01 DIAGNOSIS — Z3480 Encounter for supervision of other normal pregnancy, unspecified trimester: Secondary | ICD-10-CM

## 2012-03-01 DIAGNOSIS — Z113 Encounter for screening for infections with a predominantly sexual mode of transmission: Secondary | ICD-10-CM | POA: Insufficient documentation

## 2012-03-01 NOTE — Progress Notes (Signed)
Addended by: Garen Grams F on: 03/01/2012 11:03 AM   Modules accepted: Orders

## 2012-03-01 NOTE — Progress Notes (Signed)
S: doing well, no complaints.   O: Filed Vitals:   03/01/12 1044  BP: 106/67  Temp: 98 F (36.7 C)  Weight: 153 lb (69.4 kg)  Abdomen: soft and non-tender without masses, she has a Gravid uterus, organomegaly or hernias noted.  No guarding or rebound Extremities:  No cyanosis, edema, or deformity noted  Skin:  Pruritic rash on both lower ext.   A/P [redacted] week pregnant female 5th pregnancy.  - Saw Dr. Shawnie Pons, plan is to try VBAC and for Paraguard vs. Tubal ligation.  - she plans to breast feed - she will RTC in 1 week. -reviewed prenatal visit schedule for items to do.  - GBS, gc/cl/HIV/RPR today.

## 2012-03-04 ENCOUNTER — Encounter: Payer: Self-pay | Admitting: Family Medicine

## 2012-03-05 ENCOUNTER — Encounter (HOSPITAL_COMMUNITY): Payer: Self-pay | Admitting: *Deleted

## 2012-03-05 ENCOUNTER — Inpatient Hospital Stay (HOSPITAL_COMMUNITY)
Admission: AD | Admit: 2012-03-05 | Discharge: 2012-03-05 | Disposition: A | Discharge status: Home / Self Care | Payer: Self-pay | Source: Ambulatory Visit | Attending: Obstetrics & Gynecology | Admitting: Obstetrics & Gynecology

## 2012-03-05 DIAGNOSIS — O479 False labor, unspecified: Secondary | ICD-10-CM | POA: Insufficient documentation

## 2012-03-05 DIAGNOSIS — O09529 Supervision of elderly multigravida, unspecified trimester: Secondary | ICD-10-CM

## 2012-03-05 DIAGNOSIS — Z98891 History of uterine scar from previous surgery: Secondary | ICD-10-CM

## 2012-03-05 NOTE — MAU Note (Signed)
SAYS  UC ARE 15 MIN APART- STARTED HURTING BAD AT 0500.  NO  VE .  DENIES HSV AND MRSA

## 2012-03-05 NOTE — Discharge Instructions (Signed)

## 2012-03-09 ENCOUNTER — Ambulatory Visit (INDEPENDENT_AMBULATORY_CARE_PROVIDER_SITE_OTHER): Payer: Self-pay | Admitting: Family Medicine

## 2012-03-09 VITALS — BP 90/68 | Temp 98.5°F | Wt 152.0 lb

## 2012-03-09 DIAGNOSIS — Z3480 Encounter for supervision of other normal pregnancy, unspecified trimester: Secondary | ICD-10-CM

## 2012-03-09 NOTE — Patient Instructions (Signed)
I will see you back in one week.  Call me or go to the hospital if: You have vaginal bleeding Your baby is not moving You have water break You have contractions every 5 minutes that are painful

## 2012-03-09 NOTE — Progress Notes (Signed)
S: doing well, having contractions every 25 minutes.   O: Filed Vitals:   03/09/12 0846  BP: 90/68  Temp: 98.5 F (36.9 C)  Weight: 152 lb (68.947 kg)  Abdomen: soft and non-tender without masses, she has a Gravid uterus, organomegaly or hernias noted.  No guarding or rebound Extremities:  No cyanosis, edema, or deformity noted  Skin:  wnl Speculum vaginal: head felt, 1.5/20%/-2 A/P 39.[redacted] week pregnant female 5th pregnancy.  - Saw Dr. Shawnie Pons, plan is to try VBAC and for Paraguard vs. Tubal ligation.  - she plans to breast feed - she will RTC in 1 week. -reviewed prenatal visit schedule for items to do.  - GBS, gc/cl/HIV/RPR done

## 2012-03-10 ENCOUNTER — Encounter (HOSPITAL_COMMUNITY): Payer: Self-pay | Admitting: *Deleted

## 2012-03-10 ENCOUNTER — Inpatient Hospital Stay (HOSPITAL_COMMUNITY)
Admission: AD | Admit: 2012-03-10 | Discharge: 2012-03-13 | DRG: 775 | Disposition: A | Discharge status: Home / Self Care | Payer: Medicaid Other | Source: Ambulatory Visit | Attending: Obstetrics & Gynecology | Admitting: Obstetrics & Gynecology

## 2012-03-10 DIAGNOSIS — O09529 Supervision of elderly multigravida, unspecified trimester: Secondary | ICD-10-CM | POA: Diagnosis present

## 2012-03-10 DIAGNOSIS — O34219 Maternal care for unspecified type scar from previous cesarean delivery: Principal | ICD-10-CM | POA: Diagnosis present

## 2012-03-10 DIAGNOSIS — IMO0001 Reserved for inherently not codable concepts without codable children: Secondary | ICD-10-CM

## 2012-03-10 NOTE — Progress Notes (Signed)
Pt states that she has depression every time she is pregnant. Pt states she feel like she doesn't want them but when she has them she feels like she is a good mother

## 2012-03-10 NOTE — MAU Note (Signed)
Pt states she has been having contractions since 03/05/12. Pain became worse today

## 2012-03-11 ENCOUNTER — Encounter (HOSPITAL_COMMUNITY): Payer: Self-pay | Admitting: Advanced Practice Midwife

## 2012-03-11 ENCOUNTER — Encounter (HOSPITAL_COMMUNITY): Payer: Self-pay | Admitting: Anesthesiology

## 2012-03-11 ENCOUNTER — Inpatient Hospital Stay (HOSPITAL_COMMUNITY): Payer: Medicaid Other | Admitting: Anesthesiology

## 2012-03-11 DIAGNOSIS — O34219 Maternal care for unspecified type scar from previous cesarean delivery: Secondary | ICD-10-CM

## 2012-03-11 DIAGNOSIS — O09529 Supervision of elderly multigravida, unspecified trimester: Secondary | ICD-10-CM

## 2012-03-11 LAB — CBC
Hemoglobin: 13.1 g/dL (ref 12.0–15.0)
MCHC: 34.3 g/dL (ref 30.0–36.0)
Platelets: 124 10*3/uL — ABNORMAL LOW (ref 150–400)
RDW: 14.5 % (ref 11.5–15.5)

## 2012-03-11 LAB — SYPHILIS: RPR W/REFLEX TO RPR TITER AND TREPONEMAL ANTIBODIES, TRADITIONAL SCREENING AND DIAGNOSIS ALGORITHM: RPR Ser Ql: NONREACTIVE

## 2012-03-11 MED ORDER — SODIUM BICARBONATE 8.4 % IV SOLN
INTRAVENOUS | Status: DC | PRN
  Administered 2012-03-11: 4 mL via EPIDURAL

## 2012-03-11 MED ORDER — TETANUS-DIPHTH-ACELL PERTUSSIS 5-2.5-18.5 LF-MCG/0.5 IM SUSP: 0.5000 mL | Freq: Once | INTRAMUSCULAR | Status: DC

## 2012-03-11 MED ORDER — DIPHENHYDRAMINE HCL 50 MG/ML IJ SOLN: 12.5000 mg | INTRAMUSCULAR | Status: DC | PRN

## 2012-03-11 MED ORDER — IBUPROFEN 600 MG PO TABS: 600.0000 mg | ORAL_TABLET | Freq: Four times a day (QID) | ORAL | Status: DC | PRN

## 2012-03-11 MED ORDER — EPHEDRINE 5 MG/ML INJ: 10.0000 mg | INTRAVENOUS | Status: DC | PRN

## 2012-03-11 MED ORDER — LIDOCAINE HCL (PF) 1 % IJ SOLN
30.0000 mL | INTRAMUSCULAR | Status: DC | PRN
  Filled 2012-03-11: qty 30

## 2012-03-11 MED ORDER — OXYCODONE-ACETAMINOPHEN 5-325 MG PO TABS
1.0000 | ORAL_TABLET | ORAL | Status: DC | PRN
  Administered 2012-03-12 (×2): 1 via ORAL
  Filled 2012-03-11 (×3): qty 1

## 2012-03-11 MED ORDER — BENZOCAINE-MENTHOL 20-0.5 % EX AERO: 1.0000 "application " | INHALATION_SPRAY | CUTANEOUS | Status: DC | PRN

## 2012-03-11 MED ORDER — NALBUPHINE SYRINGE 5 MG/0.5 ML
5.0000 mg | INJECTION | INTRAMUSCULAR | Status: DC | PRN
  Administered 2012-03-11 (×2): 5 mg via INTRAVENOUS
  Filled 2012-03-11 (×2): qty 0.5

## 2012-03-11 MED ORDER — FLEET ENEMA 7-19 GM/118ML RE ENEM: 1.0000 | ENEMA | RECTAL | Status: DC | PRN

## 2012-03-11 MED ORDER — PRENATAL MULTIVITAMIN CH
1.0000 | ORAL_TABLET | Freq: Every day | ORAL | Status: DC
  Administered 2012-03-12: 1 via ORAL
  Filled 2012-03-11 (×2): qty 1

## 2012-03-11 MED ORDER — PHENYLEPHRINE 40 MCG/ML (10ML) SYRINGE FOR IV PUSH (FOR BLOOD PRESSURE SUPPORT): 80.0000 ug | PREFILLED_SYRINGE | INTRAVENOUS | Status: DC | PRN

## 2012-03-11 MED ORDER — OXYTOCIN BOLUS FROM INFUSION
500.0000 mL | Freq: Once | INTRAVENOUS | Status: DC
  Filled 2012-03-11: qty 1000
  Filled 2012-03-11: qty 500

## 2012-03-11 MED ORDER — ZOLPIDEM TARTRATE 5 MG PO TABS: 5.0000 mg | ORAL_TABLET | Freq: Every evening | ORAL | Status: DC | PRN

## 2012-03-11 MED ORDER — EPHEDRINE 5 MG/ML INJ
10.0000 mg | INTRAVENOUS | Status: DC | PRN
  Filled 2012-03-11: qty 4

## 2012-03-11 MED ORDER — LACTATED RINGERS IV SOLN
500.0000 mL | Freq: Once | INTRAVENOUS | Status: AC
  Administered 2012-03-11: 500 mL via INTRAVENOUS

## 2012-03-11 MED ORDER — FENTANYL 2.5 MCG/ML BUPIVACAINE 1/10 % EPIDURAL INFUSION (WH - ANES)
14.0000 mL/h | INTRAMUSCULAR | Status: DC
  Filled 2012-03-11: qty 60

## 2012-03-11 MED ORDER — IBUPROFEN 600 MG PO TABS
600.0000 mg | ORAL_TABLET | Freq: Four times a day (QID) | ORAL | Status: DC
  Administered 2012-03-11 – 2012-03-13 (×9): 600 mg via ORAL
  Filled 2012-03-11 (×9): qty 1

## 2012-03-11 MED ORDER — ONDANSETRON HCL 4 MG/2ML IJ SOLN: 4.0000 mg | INTRAMUSCULAR | Status: DC | PRN

## 2012-03-11 MED ORDER — HYDROXYZINE HCL 50 MG/ML IM SOLN: 50.0000 mg | Freq: Four times a day (QID) | INTRAMUSCULAR | Status: DC | PRN

## 2012-03-11 MED ORDER — CITALOPRAM HYDROBROMIDE 10 MG PO TABS
10.0000 mg | ORAL_TABLET | Freq: Every day | ORAL | Status: DC
  Administered 2012-03-11 – 2012-03-13 (×3): 10 mg via ORAL
  Filled 2012-03-11 (×4): qty 1

## 2012-03-11 MED ORDER — FENTANYL 2.5 MCG/ML BUPIVACAINE 1/10 % EPIDURAL INFUSION (WH - ANES)
INTRAMUSCULAR | Status: DC | PRN
  Administered 2012-03-11: 13 mL/h via EPIDURAL

## 2012-03-11 MED ORDER — SENNOSIDES-DOCUSATE SODIUM 8.6-50 MG PO TABS
2.0000 | ORAL_TABLET | Freq: Every day | ORAL | Status: DC
  Administered 2012-03-11 – 2012-03-12 (×2): 2 via ORAL

## 2012-03-11 MED ORDER — LACTATED RINGERS IV SOLN
INTRAVENOUS | Status: DC
  Administered 2012-03-11 (×2): via INTRAVENOUS

## 2012-03-11 MED ORDER — ONDANSETRON HCL 4 MG PO TABS: 4.0000 mg | ORAL_TABLET | ORAL | Status: DC | PRN

## 2012-03-11 MED ORDER — OXYTOCIN 20 UNITS IN LACTATED RINGERS INFUSION - SIMPLE: 125.0000 mL/h | Freq: Once | INTRAVENOUS | Status: DC

## 2012-03-11 MED ORDER — PHENYLEPHRINE 40 MCG/ML (10ML) SYRINGE FOR IV PUSH (FOR BLOOD PRESSURE SUPPORT)
80.0000 ug | PREFILLED_SYRINGE | INTRAVENOUS | Status: DC | PRN
  Filled 2012-03-11: qty 5

## 2012-03-11 MED ORDER — ONDANSETRON HCL 4 MG/2ML IJ SOLN: 4.0000 mg | Freq: Four times a day (QID) | INTRAMUSCULAR | Status: DC | PRN

## 2012-03-11 MED ORDER — SODIUM CHLORIDE 0.9 % IJ SOLN: 3.0000 mL | INTRAMUSCULAR | Status: DC | PRN

## 2012-03-11 MED ORDER — SIMETHICONE 80 MG PO CHEW: 80.0000 mg | CHEWABLE_TABLET | ORAL | Status: DC | PRN

## 2012-03-11 MED ORDER — WITCH HAZEL-GLYCERIN EX PADS: 1.0000 "application " | MEDICATED_PAD | CUTANEOUS | Status: DC | PRN

## 2012-03-11 MED ORDER — HYDROXYZINE HCL 50 MG PO TABS: 50.0000 mg | ORAL_TABLET | Freq: Four times a day (QID) | ORAL | Status: DC | PRN

## 2012-03-11 MED ORDER — CITRIC ACID-SODIUM CITRATE 334-500 MG/5ML PO SOLN: 30.0000 mL | ORAL | Status: DC | PRN

## 2012-03-11 MED ORDER — LANOLIN HYDROUS EX OINT: TOPICAL_OINTMENT | CUTANEOUS | Status: DC | PRN

## 2012-03-11 MED ORDER — DIBUCAINE 1 % RE OINT: 1.0000 "application " | TOPICAL_OINTMENT | RECTAL | Status: DC | PRN

## 2012-03-11 MED ORDER — LACTATED RINGERS IV SOLN: 500.0000 mL | INTRAVENOUS | Status: DC | PRN

## 2012-03-11 MED ORDER — DIPHENHYDRAMINE HCL 25 MG PO CAPS: 25.0000 mg | ORAL_CAPSULE | Freq: Four times a day (QID) | ORAL | Status: DC | PRN

## 2012-03-11 NOTE — Anesthesia Preprocedure Evaluation (Signed)
Anesthesia Evaluation  Patient identified by MRN, date of birth, ID band Patient awake    Reviewed: Allergy & Precautions, H&P , Patient's Chart, lab work & pertinent test results  Airway Mallampati: II TM Distance: >3 FB Neck ROM: full    Dental  (+) Teeth Intact   Pulmonary  breath sounds clear to auscultation        Cardiovascular Rhythm:regular Rate:Normal     Neuro/Psych    GI/Hepatic   Endo/Other    Renal/GU      Musculoskeletal   Abdominal   Peds  Hematology   Anesthesia Other Findings    plts 124; no PIH   Reproductive/Obstetrics (+) Pregnancy                           Anesthesia Physical Anesthesia Plan  ASA: II  Anesthesia Plan: Epidural   Post-op Pain Management:    Induction:   Airway Management Planned:   Additional Equipment:   Intra-op Plan:   Post-operative Plan:   Informed Consent: I have reviewed the patients History and Physical, chart, labs and discussed the procedure including the risks, benefits and alternatives for the proposed anesthesia with the patient or authorized representative who has indicated his/her understanding and acceptance.   Dental Advisory Given  Plan Discussed with:   Anesthesia Plan Comments: (Labs checked- platelets confirmed with RN in room. Fetal heart tracing, per RN, reported to be stable enough for sitting procedure. Discussed epidural, and patient consents to the procedure:  included risk of possible headache,backache, failed block, allergic reaction, and nerve injury. This patient was asked if she had any questions or concerns before the procedure started. )        Anesthesia Quick Evaluation

## 2012-03-11 NOTE — Progress Notes (Signed)
Aimee Figueroa is a 41 y.o. [id number] at [redacted]w[redacted]d.  Subjective: Decreased urge to push w/ nubain.  Objective: BP 84/54  Pulse 72  Temp 98.4 F (36.9 C) (Oral)  Resp 20  SpO2 97%  LMP 06/09/2011      FHT:  FHR: 150 bpm, variability: moderate,  accelerations:  Present,  decelerations:  Absent UC:   regular, every 2 minutes SVE:   Dilation: 7 Effacement (%): 90 Station: 0 Exam by:: Ace Gins, RN  Labs: Lab Results  Component Value Date   WBC 6.7 03/11/2012   HGB 13.1 03/11/2012   HCT 38.2 03/11/2012   MCV 93.6 03/11/2012   PLT 124* 03/11/2012    Assessment / Plan: Spontaneous labor, progressing normally  Labor: Progressing normally Preeclampsia:  NA Fetal Wellbeing:  Category I Pain Control:  Nubain I/D:  n/a Anticipated MOD:  NSVD Premature urge to push. Swollen cervix.  Aimee Figueroa 03/11/2012, 6:37 AM

## 2012-03-11 NOTE — Progress Notes (Signed)
I was present for the exam and agree with above.  [place], CNM 03/11/2012 9:21 AM

## 2012-03-11 NOTE — Progress Notes (Signed)
Operative Delivery Note At  a viable female delivered via SVD .  Presentation: vertex; Position: face facing left. Mild dystocia noted without nuchal cord.  Delivery of the head:  Right shoulder delivered by hooking finger under axilla and moving head anterior. Baby delivered to mothers chest.  Cord clamped and cut. Cord blood sent.  Weight: 8 lbs 7 oz APGAR: not recorded, 5 minutes: 8; Placenta status: intact, .   Cord: 3 vessel  Anesthesia: Epidural  Episiotomy: none Lacerations: none Suture Repair: none needed Est. Blood Loss (mL): 300 cc  Mom to postpartum.  Baby to nursery-stable.  Edd Arbour MD 03/11/2012, 12:20 PM    Due to precipitous progression, I was not present for the delivery of this patient despite nursing attempt to call me.  I arrived approximately 5 minutes after the delivery.  Patient examined - agree with the above note.  Levie Heritage, DO. 03/11/2012 3:18 PM

## 2012-03-11 NOTE — Progress Notes (Signed)

## 2012-03-11 NOTE — Progress Notes (Signed)
Aimee Figueroa is a 41 y.o. [id number] at [redacted]w[redacted]d by ultrasound admitted for active labor  Subjective: No complaints. Comfortable with epidural.   Objective: BP 117/61  Pulse 101  Temp 99.2 F (37.3 C) (Oral)  Resp 20  SpO2 98%  LMP 06/09/2011   Total I/O In: -  Out: 350 [Urine:350]  FHT:  FHR: 140's bpm, variability: minimal ,  accelerations:  Abscent,  decelerations:  Absent UC:   regular, every 4 minutes SVE:   Dilation: Lip/rim Effacement (%): 90;100 Station: -1;+1 Exam by:: Edd Arbour MD  Labs: Lab Results  Component Value Date   WBC 6.7 03/11/2012   HGB 13.1 03/11/2012   HCT 38.2 03/11/2012   MCV 93.6 03/11/2012   PLT 124* 03/11/2012    Assessment / Plan: Spontaneous labor, progressing normally  Labor: Progressing normally Preeclampsia:  no signs or symptoms of toxicity Fetal Wellbeing:  Category I Pain Control:  Epidural I/D:  n/a Anticipated MOD:  NSVD  Deni Lefever MD 03/11/2012, 11:04 AM

## 2012-03-11 NOTE — Progress Notes (Signed)
Aimee Figueroa is a 41 y.o. [id number] at [redacted]w[redacted]d by LMP admitted for regular contractions and cervical dilation.  Subjective: Pt tolerating labor. Rupture of membranes tolerated.  Objective: BP 108/53  Pulse 78  Temp 98.4 F (36.9 C) (Oral)  Resp 20  LMP 06/09/2011      FHT:  FHR: 150 bpm, variability: moderate,  accelerations:  Present,  decelerations:  Absent UC:   regular, every 2-3 minutes SVE:   Dilation: 7 Effacement (%): 90 Station: 0 Exam by:: Ivonne Andrew, CNM AROM performed  Dilation: 7 Effacement (%): 90 Cervical Position: Middle Station: 0 Presentation: Vertex Exam by:: Ivonne Andrew, CNM  Labs: Lab Results  Component Value Date   WBC 6.7 03/11/2012   HGB 13.1 03/11/2012   HCT 38.2 03/11/2012   MCV 93.6 03/11/2012   PLT 124* 03/11/2012    Assessment / Plan: Augmentation of labor, progressing well  Labor: Progressing on pitocin, AROM Preeclampsia:  n/a Fetal Wellbeing:  Category I Pain Control:  Labor support with nubain I/D:  n/a Anticipated MOD:  NSVD  Walid Haig 03/11/2012, 4:15 AM

## 2012-03-11 NOTE — H&P (Signed)
Aimee Figueroa is a 41 y.o. female [phone] presenting with regular, 8 min apart, contrations. Pt was seen in clinic yesterday and was 1.5cm dilatated with regular contractions. Pt reports having contractions since Friday. Small LOF today. No bleeding.  Of note, pt would like to speak with social worker about depression postpartum.  Maternal Medical History:  Reason for admission: Reason for admission: contractions.  Reason for Admission:   nauseaContractions: Onset was 13-24 hours ago.   Frequency: regular.   Duration is approximately 6 minutes.   Perceived severity is moderate.    Fetal activity: Perceived fetal activity is normal.   Last perceived fetal movement was within the past hour.    Prenatal complications: No bleeding, HIV, hypertension, infection, IUGR, oligohydramnios, polyhydramnios or pre-eclampsia.   Prenatal Complications - Diabetes: none.    OB History    Grav Para Term Preterm Abortions TAB SAB Ect Mult Living   7 4 4  0 2  2   4      Past Medical History  Diagnosis Date  . Abnormal Pap smear   . Depression   . Tuberculosis     41 yrs old   Past Surgical History  Procedure Date  . Cesarean section    Family History: family history includes Diabetes in her father.  There is no history of Anesthesia problems. Social History:  reports that she has never smoked. She does not have any smokeless tobacco history on file. She reports that she does not drink alcohol or use illicit drugs.  Review of Systems  Constitutional: Negative for fever and chills.  HENT: Negative for hearing loss, ear pain, congestion and sore throat.   Eyes: Negative for blurred vision, double vision, photophobia and pain.  Respiratory: Positive for shortness of breath (2 days ago, laying flat, relieved with sitting). Negative for cough, wheezing and stridor.   Cardiovascular: Negative for chest pain and palpitations.  Gastrointestinal: Positive for heartburn. Negative for nausea, vomiting,  abdominal pain, diarrhea and constipation.  Genitourinary: Positive for urgency and frequency. Negative for dysuria.  Skin: Negative.   Neurological: Negative for dizziness, tingling, seizures, weakness and headaches.  Endo/Heme/Allergies: Does not bruise/bleed easily.  Psychiatric/Behavioral: Positive for depression. Negative for suicidal ideas. The patient is nervous/anxious.     Dilation: 4 Effacement (%): 80 Station: -2 Blood pressure 111/60, pulse 79, temperature 97.3 F (36.3 C), temperature source Oral, resp. rate 18, last menstrual period 06/09/2011. Maternal Exam:  Uterine Assessment: Contraction strength is moderate.  Contraction frequency is regular.   Abdomen: Surgical scars: low transverse.   Fetal presentation: vertex  Introitus: Ferning test: not done.  Nitrazine test: not done.  Pelvis: adequate for delivery.   Cervix: not evaluated. Deferred, just done by nurse  Physical Exam  Constitutional: She appears well-developed and well-nourished. No distress.  HENT:  Head: Normocephalic.  Eyes: Conjunctivae and EOM are normal. Pupils are equal, round, and reactive to light. Right eye exhibits no discharge. Left eye exhibits no discharge.  Neck: Normal range of motion.  Cardiovascular: Normal rate, regular rhythm, normal heart sounds and intact distal pulses.   No murmur heard. Respiratory: Effort normal and breath sounds normal. No respiratory distress. She has no wheezes.  GI: She exhibits distension and mass. There is tenderness (throughout abd). There is no rebound and no guarding.  Neurological: She is alert.  Skin: Skin is warm and dry. She is not diaphoretic.    Prenatal labs: ABO, Rh: O/POS/-- (12/12 1035) Antibody: NEG (12/12 1035) Rubella: >500.0 (12/12 1035) RPR:  NON REAC (04/05 0914)  HBsAg: NEGATIVE (12/12 1035)  HIV: NON REACTIVE (04/05 0914)  GBS: NEGATIVE (06/03 1105)  1 hour GTT 159. 3 hour normal   Assessment/Plan: 1. Spontaneous  labor Admit to floor Admission orders GBS negative Pain Rx through IV, no epidural   Ricardo Jericho 03/11/2012, 12:07 AM  I was present for the exam and agree with above.  Prior C/S x 1 for breech. VBAC consent under media tab. Vaginal delivery x 3. Declined genetic screening/tesing.  [place], CNM 03/11/2012 9:19 AM

## 2012-03-11 NOTE — Anesthesia Procedure Notes (Signed)
Epidural Patient location during procedure: OB  Preanesthetic Checklist Completed: patient identified, site marked, surgical consent, pre-op evaluation, timeout performed, IV checked, risks and benefits discussed and monitors and equipment checked  Epidural Patient position: sitting Prep: site prepped and draped and DuraPrep Patient monitoring: continuous pulse ox and blood pressure Approach: midline Injection technique: LOR air  Needle:  Needle type: Tuohy  Needle gauge: 17 G Needle length: 9 cm Needle insertion depth: 5 cm cm Catheter type: closed end flexible Catheter size: 19 Gauge Catheter at skin depth: 10 cm Test dose: negative  Assessment Events: blood not aspirated, injection not painful, no injection resistance, negative IV test and no paresthesia  Additional Notes Dosing of Epidural:  1st dose, through needle ............................................. epi 1:200K + Xylocaine 40 mg  2nd dose, through catheter, after waiting 3 minutes.....epi 1:200K + Xylocaine 40 mg  3rd dose, through catheter after waiting 3 minutes .............................Marcaine   4mg   ( mg Marcaine are expressed as equivilent  cc's medication removed from the 0.1%Bupiv / fentanyl syringe from L&D pump)  ( 2% Xylo charted as a single dose in Epic Meds for ease of charting; actual dosing was fractionated as above, for saftey's sake)  As each dose occurred, patient was free of IV sx; and patient exhibited no evidence of SA injection.  Patient is more comfortable after epidural dosed. Please see RN's note for documentation of vital signs,and FHR which are stable.  Patient reminded not to try to ambulate with numb legs, and that an RN must be present the 1st time she attempts to get up.    

## 2012-03-12 LAB — CBC
HCT: 31.3 % — ABNORMAL LOW (ref 36.0–46.0)
MCH: 31.4 pg (ref 26.0–34.0)
MCV: 95.4 fL (ref 78.0–100.0)
Platelets: 113 10*3/uL — ABNORMAL LOW (ref 150–400)
RBC: 3.28 MIL/uL — ABNORMAL LOW (ref 3.87–5.11)
WBC: 8.5 10*3/uL (ref 4.0–10.5)

## 2012-03-12 NOTE — Anesthesia Postprocedure Evaluation (Signed)
  Anesthesia Post-op Note  Patient: Aimee Figueroa  Procedure(s) Performed: * No procedures listed *  Patient Location: Mother/Baby  Anesthesia Type: Epidural  Level of Consciousness: awake, alert  and oriented  Airway and Oxygen Therapy: Patient Spontanous Breathing  Post-op Pain: none  Post-op Assessment: Post-op Vital signs reviewed and Patient's Cardiovascular Status Stable  Post-op Vital Signs: Reviewed and stable  Complications: No apparent anesthesia complications

## 2012-03-12 NOTE — Progress Notes (Signed)
Post Partum Day 1 Subjective: no complaints, up ad lib, voiding and tolerating PO Bleeding reducing Objective: Blood pressure 93/48, pulse 67, temperature 97.6 F (36.4 C), temperature source Oral, resp. rate 18, last menstrual period 06/09/2011, SpO2 98.00%, unknown if currently breastfeeding.  Physical Exam:  General: alert and cooperative Lochia: appropriate Uterine Fundus: firm Incision: none DVT Evaluation: No evidence of DVT seen on physical exam. Negative Homan's sign.   Basename 03/12/12 0540 03/11/12 0050  HGB 10.3* 13.1  HCT 31.3* 38.2    Assessment/Plan: Circumcision prior to discharge and Contraception desires Tubal Ligation, waiting for pricing DC in am.    LOS: 2 days   Rishith Siddoway MD 03/12/2012, 9:15 AM

## 2012-03-12 NOTE — Progress Notes (Signed)
UR chart review completed.  

## 2012-03-13 MED ORDER — IBUPROFEN 600 MG PO TABS: 600.0000 mg | ORAL_TABLET | Freq: Four times a day (QID) | ORAL | Status: AC

## 2012-03-13 MED ORDER — CITALOPRAM HYDROBROMIDE 10 MG PO TABS: 10.0000 mg | ORAL_TABLET | Freq: Every day | ORAL | Status: DC

## 2012-03-13 NOTE — Discharge Instructions (Signed)
F/u with Dr. Rivka Safer in 6 weeks.

## 2012-03-13 NOTE — Discharge Summary (Signed)
Attestation of Attending Supervision of Resident: Evaluation and management procedures were performed by the [facility] Medicine Resident under my supervision.  I have seen and examined the patient, reviewed the resident's note and chart, and I agree with management and plan.   Jaynie Collins, M.D. 03/13/2012 11:11 AM

## 2012-03-13 NOTE — Progress Notes (Signed)
Attestation of Attending Supervision of Resident: Evaluation and management procedures were performed by the Family Medicine Resident under my supervision.  I have seen and examined the patient, reviewed the resident's note and chart, and I agree with management and plan.   Trayton Szabo, M.D. 03/13/2012 11:11 AM    

## 2012-03-13 NOTE — Discharge Summary (Signed)
Obstetric Discharge Summary Reason for Admission: onset of labor Prenatal Procedures: none Intrapartum Procedures: spontaneous vaginal delivery Postpartum Procedures: none Complications-Operative and Postpartum: none Hemoglobin  Date Value Range Status  03/12/2012 10.3* 12.0 - 15.0 g/dL Final     DELTA CHECK NOTED     REPEATED TO VERIFY     HCT  Date Value Range Status  03/12/2012 31.3* 36.0 - 46.0 % Final    Physical Exam:  General: alert and cooperative Lochia: appropriate Uterine Fundus: firm Incision: none DVT Evaluation: No evidence of DVT seen on physical exam. Negative Homan's sign.  Discharge Diagnoses: Term Pregnancy-delivered Post partum depression.  Discharge Information: Date: 03/13/2012 Activity: unrestricted Diet: routine Medications: PNV, Ibuprofen and celexa 10 mg qd Condition: stable Instructions: refer to practice specific booklet Discharge to: home   Newborn Data: Live born female  Birth Weight: 8 lb 7.1 oz (3830 g) APGAR: 7, 8  Mother will room in with baby till Monday.   Edd Arbour MD 03/13/2012, 11:02 AM

## 2012-03-13 NOTE — Progress Notes (Signed)
Post Partum Day 2 Subjective: no complaints, up ad lib, voiding, tolerating PO  Abdomen soft and nontender.  C/o swelling in feet, improved overnight.  Objective: Blood pressure 90/55, pulse 67, temperature 97.8 F (36.6 C), temperature source Oral, resp. rate 18, last menstrual period 06/09/2011, SpO2 98.00%, unknown if currently breastfeeding.  Physical Exam:  General: alert and cooperative Lochia: appropriate Uterine Fundus: firm Incision: none DVT Evaluation: No evidence of DVT seen on physical exam. Minimal edema in bilateral feet.    Basename 03/12/12 0540 03/11/12 0050  HGB 10.3* 13.1  HCT 31.3* 38.2    Assessment/Plan: Contraception wants Tubal ligation Patient stable for Discharge. Will request Room-in for mother, Baby staying till Monday 2nd to humerus fracture awaiting consult from ortho.  Plan per Aimee Figueroa - Midwife -OB.    LOS: 3 days   Aimee Hendricksen MD 03/13/2012, 10:56 AM

## 2012-03-15 ENCOUNTER — Encounter: Payer: Self-pay | Admitting: Family Medicine

## 2012-03-22 ENCOUNTER — Telehealth (HOSPITAL_COMMUNITY): Payer: Self-pay | Admitting: *Deleted

## 2012-03-22 NOTE — Telephone Encounter (Signed)
Preadmission screen  

## 2012-03-28 ENCOUNTER — Inpatient Hospital Stay (HOSPITAL_COMMUNITY): Admission: RE | Admit: 2012-03-28 | Payer: Self-pay | Source: Ambulatory Visit

## 2012-04-05 ENCOUNTER — Ambulatory Visit (HOSPITAL_COMMUNITY)
Admission: RE | Admit: 2012-04-05 | Discharge: 2012-04-05 | Disposition: A | Discharge status: Home / Self Care | Payer: Self-pay | Source: Ambulatory Visit | Attending: Family Medicine | Admitting: Family Medicine

## 2012-04-05 NOTE — Progress Notes (Signed)
Adult Lactation Consultation Outpatient Visit Note  Patient Name: Aimee Figueroa  ZOXW name: Aimee Figueroa Date of Birth: 03-08-1971           DOB: [birth year]  (65 days old) Gestational Age at Delivery:  Full term Birth weight: 8 lbs. 7.1 oz        Today's weight: 9 lbs. 6 oz. Type of Delivery: vaginal delivery  Breastfeeding History: Frequency of Breastfeeding:   "just started last couple days" Length of Feeding: 3-5 mins Voids: 6-7 per 24 hrs. Stools: 1 per 24 hrs.  Brown changing to yellow last 2 days  Supplementing / Method: Pumping:  Type of Pump:  Manual   Frequency:  Nothing last couple weeks  Volume:  0  Comments:  Mom states that she was so overwhelmed when getting home, she stopped pumping her left breast which was the more difficult side to latch [name].  He was getting a lot of bottles of formula, and he began fussing when she tried to latch him to the right side as well.  He had a cast on his right arm (from fracture from delivery), and it made it difficult to position him.  She stopped latching him, and stopped pumping.  She wants to try to breast feed him again.  His cast is off now.  Assessment:  Reviewed basics in latching [name] in cross cradle hold, and football hold.  This way, his right arm can be on top, and less likely he would feel discomfort.  [name] has very large in diameter nipples, left one more inverted, but both do evert.  [name] opens him mouth very wide, and does latch well.  Due to very little milk, baby doesn't suck deeply, but rather using non-nutritive sucking.  Initiated an SNS with 2 oz formula onto left breast.  Baby latched and fed very well.  [name], as she felt [name] was latched well and contented at her breast.  She is complaining about occasional shooting pains up into her breasts.  Denies burning, stinging, itching on nipples.  Both nipples not pink in color. Talked about possible let down sensation.  Baby does have thick white  coating on tongue, no diaper rash.  She will continue to watch this.  Baby took only 2 oz from SNS, nothing from breast.  Discussed with [name] the importance of obtaining a double electric breast pump to stimulate her milk supply.  Also discussed with her the possibility of her milk supply always being low due to the period of time she went without pumping or breast feeding.  I called [facility] breastfeeding hotline, and left [name]'s cell # and information about the importance of her obtaining a DEBP to stimulate her milk supply.  [name].    Follow-Up  To call us prn, after pumping and obtaining more breast milk.    [name] 04/05/2012, 4:52 PM

## 2012-06-23 ENCOUNTER — Encounter (HOSPITAL_COMMUNITY)
Admission: RE | Admit: 2012-06-23 | Discharge: 2012-06-23 | Disposition: A | Discharge status: Home / Self Care | Payer: Self-pay | Source: Ambulatory Visit | Attending: Family Medicine | Admitting: Family Medicine

## 2012-06-23 DIAGNOSIS — O923 Agalactia: Secondary | ICD-10-CM | POA: Insufficient documentation

## 2012-10-17 ENCOUNTER — Encounter: Payer: Self-pay | Admitting: Family Medicine

## 2012-10-17 ENCOUNTER — Ambulatory Visit: Payer: Self-pay | Admitting: Family Medicine

## 2012-10-17 ENCOUNTER — Ambulatory Visit: Payer: Self-pay

## 2012-10-17 VITALS — BP 120/72 | HR 84 | Temp 98.2°F | Resp 16 | Ht 62.0 in | Wt 137.8 lb

## 2012-10-17 DIAGNOSIS — R059 Cough, unspecified: Secondary | ICD-10-CM

## 2012-10-17 DIAGNOSIS — R05 Cough: Secondary | ICD-10-CM

## 2012-10-17 DIAGNOSIS — J0110 Acute frontal sinusitis, unspecified: Secondary | ICD-10-CM

## 2012-10-17 MED ORDER — PREDNISONE 20 MG PO TABS: ORAL_TABLET | ORAL | Status: DC

## 2012-10-17 MED ORDER — IPRATROPIUM BROMIDE 0.03 % NA SOLN: 2.0000 | Freq: Two times a day (BID) | NASAL | Status: DC

## 2012-10-17 MED ORDER — BENZONATATE 100 MG PO CAPS: 100.0000 mg | ORAL_CAPSULE | Freq: Three times a day (TID) | ORAL | Status: DC | PRN

## 2012-10-17 MED ORDER — AMOXICILLIN 500 MG PO CAPS: ORAL_CAPSULE | ORAL | Status: DC

## 2012-10-17 NOTE — Patient Instructions (Addendum)
1. Cough  DG Chest 2 View, benzonatate (TESSALON) 100 MG capsule  2. Sinusitis, acute frontal  amoxicillin (AMOXIL) 500 MG capsule, predniSONE (DELTASONE) 20 MG tablet   Sinusitis Sinusitis is redness, soreness, and swelling (inflammation) of the paranasal sinuses. Paranasal sinuses are air pockets within the bones of your face (beneath the eyes, the middle of the forehead, or above the eyes). In healthy paranasal sinuses, mucus is able to drain out, and air is able to circulate through them by way of your nose. However, when your paranasal sinuses are inflamed, mucus and air can become trapped. This can allow bacteria and other germs to grow and cause infection. Sinusitis can develop quickly and last only a short time (acute) or continue over a long period (chronic). Sinusitis that lasts for more than 12 weeks is considered chronic.  CAUSES  Causes of sinusitis include:  Allergies.  Structural abnormalities, such as displacement of the cartilage that separates your nostrils (deviated septum), which can decrease the air flow through your nose and sinuses and affect sinus drainage.  Functional abnormalities, such as when the small hairs (cilia) that line your sinuses and help remove mucus do not work properly or are not present. SYMPTOMS  Symptoms of acute and chronic sinusitis are the same. The primary symptoms are pain and pressure around the affected sinuses. Other symptoms include:  Upper toothache.  Earache.  Headache.  Bad breath.  Decreased sense of smell and taste.  A cough, which worsens when you are lying flat.  Fatigue.  Fever.  Thick drainage from your nose, which often is green and may contain pus (purulent).  Swelling and warmth over the affected sinuses. DIAGNOSIS  Your caregiver will perform a physical exam. During the exam, your caregiver may:  Look in your nose for signs of abnormal growths in your nostrils (nasal polyps).  Tap over the affected sinus to  check for signs of infection.  View the inside of your sinuses (endoscopy) with a special imaging device with a light attached (endoscope), which is inserted into your sinuses. If your caregiver suspects that you have chronic sinusitis, one or more of the following tests may be recommended:  Allergy tests.  Nasal culture A sample of mucus is taken from your nose and sent to a lab and screened for bacteria.  Nasal cytology A sample of mucus is taken from your nose and examined by your caregiver to determine if your sinusitis is related to an allergy. TREATMENT  Most cases of acute sinusitis are related to a viral infection and will resolve on their own within 10 days. Sometimes medicines are prescribed to help relieve symptoms (pain medicine, decongestants, nasal steroid sprays, or saline sprays).  However, for sinusitis related to a bacterial infection, your caregiver will prescribe antibiotic medicines. These are medicines that will help kill the bacteria causing the infection.  Rarely, sinusitis is caused by a fungal infection. In theses cases, your caregiver will prescribe antifungal medicine. For some cases of chronic sinusitis, surgery is needed. Generally, these are cases in which sinusitis recurs more than 3 times per year, despite other treatments. HOME CARE INSTRUCTIONS   Drink plenty of water. Water helps thin the mucus so your sinuses can drain more easily.  Use a humidifier.  Inhale steam 3 to 4 times a day (for example, sit in the bathroom with the shower running).  Apply a warm, moist washcloth to your face 3 to 4 times a day, or as directed by your caregiver.  Use  saline nasal sprays to help moisten and clean your sinuses.  Take over-the-counter or prescription medicines for pain, discomfort, or fever only as directed by your caregiver. SEEK IMMEDIATE MEDICAL CARE IF:  You have increasing pain or severe headaches.  You have nausea, vomiting, or drowsiness.  You have  swelling around your face.  You have vision problems.  You have a stiff neck.  You have difficulty breathing. MAKE SURE YOU:   Understand these instructions.  Will watch your condition.  Will get help right away if you are not doing well or get worse. Document Released: 09/15/2005 Document Revised: 12/08/2011 Document Reviewed: 09/30/2011 [facility] Patient Information 2013 [place], [place].

## 2012-10-17 NOTE — Progress Notes (Signed)
[address]   [place], [place]  [address]   [phone]  Subjective:    Patient ID: Aimee Figueroa, female    DOB: 08-27-1971, 42 y.o.   MRN: [id number]  HPIThis 42 y.o. female presents for evaluation of cold symptoms.  Onset two weeks ago with cough, congestion.  Taking OTC pills from [place] antibiotic Petrenxyl.  Also taking Flanex/antiinflammatory.  Also taking Robitussin for cough.  Five days ago, unable to smell or taste; still very congested.  Has 23 month old.  Son has ear infection and nebulizer for wheezing.  Also has ten year old son with same thing.  No fever/chills/sweats. +HA.  +chest pain.  Intermittent ear pain.  +ST.  +rhinorrhea; +nasal congestion severe; congestion yellow and thick.  +coughing a lot but has improved; +SOB.  No v/d.  No breastfeeding.   No tobacco; no working currently.  +chest tightness B chest; no wheezing.  LMP 09-16-12; OCP use.   Review of Systems  Constitutional: Negative for fever, chills, diaphoresis and fatigue.  HENT: Positive for ear pain, congestion, rhinorrhea, voice change, postnasal drip and sinus pressure. Negative for sore throat and trouble swallowing.   Respiratory: Positive for cough, chest tightness and shortness of breath. Negative for wheezing.   Gastrointestinal: Negative for nausea, vomiting and diarrhea.  Skin: Negative for rash.       Past Medical History  Diagnosis Date  . Abnormal Pap smear   . Depression   . Tuberculosis     42 yrs old    Past Surgical History  Procedure Date  . Cesarean section     Prior to Admission medications   Medication Sig Start Date End Date Taking? Authorizing Provider  citalopram (CELEXA) 10 MG tablet Take 1 tablet (10 mg total) by mouth daily. 03/13/12 03/13/13  Edd Arbour, MD  loratadine (CLARITIN) 10 MG tablet Take 10 mg by mouth daily as needed. allergies    Historical Provider, MD  Prenatal Vit-Fe Fumarate-FA (PRENATAL MULTIVITAMIN) TABS Take 1 tablet by mouth daily.    Historical  Provider, MD    Allergies  Allergen Reactions  . Tylenol (Acetaminophen) Rash    History   Social History  . Marital Status: Single    Spouse Name: N/A    Number of Children: N/A  . Years of Education: N/A   Occupational History  . Not on file.   Social History Main Topics  . Smoking status: Never Smoker   . Smokeless tobacco: Not on file  . Alcohol Use: No  . Drug Use: No  . Sexually Active: Yes     Comment: was using pill    Other Topics Concern  . Not on file   Social History Narrative  . No narrative on file    Family History  Problem Relation Age of Onset  . Diabetes Father   . Anesthesia problems Neg Hx     Objective:   Physical Exam  Nursing note and vitals reviewed. Constitutional: She is oriented to person, place, and time. She appears well-developed and well-nourished. No distress.  HENT:  Head: Normocephalic and atraumatic.  Right Ear: External ear normal.  Left Ear: External ear normal.  Nose: Mucosal edema and rhinorrhea present. Right sinus exhibits frontal sinus tenderness. Right sinus exhibits no maxillary sinus tenderness. Left sinus exhibits frontal sinus tenderness. Left sinus exhibits no maxillary sinus tenderness.  Mouth/Throat: Oropharynx is clear and moist.  Eyes: Conjunctivae normal are normal. Pupils are equal, round, and reactive to light.  Neck:  Normal range of motion. Neck supple.  Cardiovascular: Normal rate, regular rhythm and normal heart sounds.   No murmur heard. Pulmonary/Chest: Effort normal and breath sounds normal. No respiratory distress. She has no wheezes. She has no rales.  Lymphadenopathy:    She has no cervical adenopathy.  Neurological: She is alert and oriented to person, place, and time.  Skin: No rash noted. She is not diaphoretic.  Psychiatric: She has a normal mood and affect. Her behavior is normal.       [facility] reading (PRIMARY) by  Dr. Katrinka Blazing.  CXR:  NAD   Assessment & Plan:   1. Cough  DG Chest 2  View  2. Sinusitis, acute frontal      1. Acute Sinusitis:  New.  Rx for Amoxicillin and Prednisone provided.  Rx for Atrovent nasal spray prescribed. 2.  Cough:  New.  Associated with acute sinusitis.  CXR negative.  Rx for [employer] provided.  RTC for acute worsening or persistent symptoms.  Meds ordered this encounter  Medications  . amoxicillin (AMOXIL) 500 MG capsule    Sig: 2 tablets twice daily for ten days    Dispense:  40 capsule    Refill:  0  . predniSONE (DELTASONE) 20 MG tablet    Sig: One pill twice daily for five days then one pill once daily for five days    Dispense:  15 tablet    Refill:  0  . benzonatate (TESSALON) 100 MG capsule    Sig: Take 1-2 capsules (100-200 mg total) by mouth 3 (three) times daily as needed for cough.    Dispense:  40 capsule    Refill:  0

## 2013-07-22 ENCOUNTER — Ambulatory Visit: Payer: Self-pay | Admitting: Family Medicine

## 2013-07-22 VITALS — BP 106/98 | HR 88 | Temp 98.5°F | Resp 16 | Ht 62.0 in | Wt 144.0 lb

## 2013-07-22 DIAGNOSIS — N760 Acute vaginitis: Secondary | ICD-10-CM

## 2013-07-22 DIAGNOSIS — R102 Pelvic and perineal pain: Secondary | ICD-10-CM

## 2013-07-22 DIAGNOSIS — N72 Inflammatory disease of cervix uteri: Secondary | ICD-10-CM

## 2013-07-22 LAB — POCT URINALYSIS DIPSTICK
Bilirubin, UA: NEGATIVE
Blood, UA: NEGATIVE
Glucose, UA: NEGATIVE
Ketones, UA: NEGATIVE
Leukocytes, UA: NEGATIVE
Nitrite, UA: NEGATIVE
Protein, UA: NEGATIVE
Spec Grav, UA: 1.015
Urobilinogen, UA: 0.2
pH, UA: 7

## 2013-07-22 LAB — POCT WET PREP WITH KOH
KOH Prep POC: NEGATIVE
RBC Wet Prep HPF POC: NEGATIVE
Trichomonas, UA: NEGATIVE
Yeast Wet Prep HPF POC: NEGATIVE

## 2013-07-22 LAB — POCT UA - MICROSCOPIC ONLY
Casts, Ur, LPF, POC: NEGATIVE
Mucus, UA: NEGATIVE
RBC, urine, microscopic: NEGATIVE
WBC, Ur, HPF, POC: NEGATIVE
Yeast, UA: NEGATIVE

## 2013-07-22 MED ORDER — PHENAZOPYRIDINE HCL 200 MG PO TABS: 200.0000 mg | ORAL_TABLET | Freq: Three times a day (TID) | ORAL | Status: DC | PRN

## 2013-07-22 NOTE — Progress Notes (Signed)
[facility]:  Office Visit  Chief Complaint:  Chief Complaint  Patient presents with  . Abdominal Pain    low abdominal pain * 1 week- "like menstrual cramps almost"    HPI: Aimee Figueroa is a 42 y.o. female who is here for abdominal pain and back pain for 1 and half weeks, having clear vaginal discharge with itching, [facility] with a pap smear on 07/21/2013, she is concerned because she is having bilateral "ovary pain" and it has gotten worse.  Sharp pain not constant,but the pain is all day 3-4/10 all day She had UA and also wet prep at [facility] and it was normal She has no h/o ovarian cysts, renal stones.  She still has appendix, ovaries, uterus, GB LMP Oct 10 Menarche 13, 5 -6 days, very heavy, some blood clots, she is noticing  That the periods are much heavier now when she gets them, more clots No problems urinating, currently no longer has dc , no history of kidney stones + vaginal itching Denies Fevers, chills, n/v/back pain, dysuria/hematuria No prior h/o STD  Past Medical History  Diagnosis Date  . Abnormal Pap smear   . Depression   . Tuberculosis     42 yrs old   Past Surgical History  Procedure Laterality Date  . Cesarean section     History   Social History  . Marital Status: Single    Spouse Name: N/A    Number of Children: N/A  . Years of Education: N/A   Social History Main Topics  . Smoking status: Never Smoker   . Smokeless tobacco: None  . Alcohol Use: No  . Drug Use: No  . Sexual Activity: Yes     Comment: was using pill    Other Topics Concern  . None   Social History Narrative  . None   Family History  Problem Relation Age of Onset  . Diabetes Father   . Anesthesia problems Neg Hx    Allergies  Allergen Reactions  . Tylenol [Acetaminophen] Rash   Prior to Admission medications   Medication Sig Start Date End Date Taking? Authorizing Provider  citalopram (CELEXA) 10 MG tablet Take 1 tablet  (10 mg total) by mouth daily. 03/13/12 03/13/13  Edd Arbour, MD     ROS: The patient denies fevers, chills, night sweats, unintentional weight loss, chest pain, palpitations, wheezing, dyspnea on exertion, nausea, vomiting, dysuria, hematuria, melena, numbness, weakness, or tingling.  All other systems have been reviewed and were otherwise negative with the exception of those mentioned in the HPI and as above.    PHYSICAL EXAM: Filed Vitals:   07/22/13 1216  BP: 106/98  Pulse: 88  Temp: 98.5 F (36.9 C)  Resp: 16  Spo2 98% Filed Vitals:   07/22/13 1216  Height: 5\' 2"  (1.575 m)  Weight: 144 lb (65.318 kg)   Body mass index is 26.33 kg/(m^2).  General: Alert, no acute distress HEENT:  Normocephalic, atraumatic, oropharynx patent. EOMI, PERRLA Cardiovascular:  Regular rate and rhythm, no rubs murmurs or gallops.  No Carotid bruits, radial pulse intact. No pedal edema.  Respiratory: Clear to auscultation bilaterally.  No wheezes, rales, or rhonchi.  No cyanosis, no use of accessory musculature GI: No organomegaly, abdomen is soft and minimally tender above pubic area, positive bowel sounds.  No masses. Skin: No rashes. Neurologic: Facial musculature symmetric. Psychiatric: Patient is appropriate throughout our interaction. Lymphatic: No cervical lymphadenopathy Musculoskeletal: Gait  intact. GU exam- + vaginal dc, slightly tender above pubic area but no in RLQ or LLQ or where ovaries are located No lesions, no masses, no LAD of inguinal nodes   LABS: Results for orders placed in visit on 07/22/13  POCT WET PREP WITH KOH      Result Value Range   Trichomonas, UA Negative     Clue Cells Wet Prep HPF POC 0-3     Epithelial Wet Prep HPF POC 6-21     Yeast Wet Prep HPF POC neg     Bacteria Wet Prep HPF POC 2+     RBC Wet Prep HPF POC neg     WBC Wet Prep HPF POC 0-1     KOH Prep POC Negative    POCT URINALYSIS DIPSTICK      Result Value Range   Color, UA yellow      Clarity, UA clear     Glucose, UA neg     Bilirubin, UA neg     Ketones, UA neg     Spec Grav, UA 1.015     Blood, UA neg     pH, UA 7.0     Protein, UA neg     Urobilinogen, UA 0.2     Nitrite, UA neg     Leukocytes, UA Negative    POCT UA - MICROSCOPIC ONLY      Result Value Range   WBC, Ur, HPF, POC neg     RBC, urine, microscopic neg     Bacteria, U Microscopic trace     Mucus, UA neg     Epithelial cells, urine per micros 3-8     Crystals, Ur, HPF, POC uric acid     Casts, Ur, LPF, POC neg     Yeast, UA neg       EKG/XRAY:   Primary read interpreted by Dr. Conley Rolls at [facility].   ASSESSMENT/PLAN: Encounter Diagnoses  Name Primary?  . Pelvic pain Yes  . Vaginitis and vulvovaginitis    ? PID, will await for GC from HD vs ovarian cysts vs fibroids  Rx pyridum Push fluids If no improvement after 1 week then will get [place] of pelvis She is a self pay pt and we discussed the options, she will go to ER prn but for now will do conservative outpatient managment Will await [facility] and also [facility]  Gross sideeffects, risk and benefits, and alternatives of medications d/w patient. Patient is aware that all medications have potential sideeffects and we are unable to predict every sideeffect or drug-drug interaction that may occur.  LE, THAO PHUONG, DO 07/22/2013 2:02 PM

## 2013-07-29 ENCOUNTER — Telehealth: Payer: Self-pay

## 2013-07-29 DIAGNOSIS — R102 Pelvic and perineal pain: Secondary | ICD-10-CM

## 2013-07-29 NOTE — Telephone Encounter (Signed)
Called her to advise this has been ordered and she will get call to schedule

## 2013-07-29 NOTE — Telephone Encounter (Signed)
DR. Conley Rolls - Pt says you told her to call if she wasn't any better and that you would refer her for an ultra sound.  She is not better and would like to get this scheduled. [phone]

## 2013-08-03 ENCOUNTER — Ambulatory Visit
Admission: RE | Admit: 2013-08-03 | Discharge: 2013-08-03 | Disposition: A | Discharge status: Home / Self Care | Payer: No Typology Code available for payment source | Source: Ambulatory Visit | Attending: Family Medicine | Admitting: Family Medicine

## 2013-08-03 DIAGNOSIS — R102 Pelvic and perineal pain: Secondary | ICD-10-CM

## 2013-08-05 ENCOUNTER — Telehealth: Payer: Self-pay | Admitting: Family Medicine

## 2013-08-05 NOTE — Telephone Encounter (Signed)
Spoke to patient about SU results, will send her resutls and also explanation in spanish about fibroids

## 2013-08-30 ENCOUNTER — Telehealth: Payer: Self-pay

## 2013-08-30 NOTE — Telephone Encounter (Signed)
Pt spoke to Dr. Conley Rolls about her ultrasound results a few weeks ago.  She requested a copy of those results and was told they would be mailed to her.  She has never received those.  Please send.  [phone]

## 2013-08-30 NOTE — Telephone Encounter (Signed)
Mailed for her.

## 2013-09-28 ENCOUNTER — Other Ambulatory Visit: Payer: Self-pay | Admitting: Family Medicine

## 2013-09-28 DIAGNOSIS — Z1231 Encounter for screening mammogram for malignant neoplasm of breast: Secondary | ICD-10-CM

## 2013-10-18 ENCOUNTER — Ambulatory Visit (HOSPITAL_COMMUNITY): Payer: Self-pay

## 2013-10-21 ENCOUNTER — Ambulatory Visit: Payer: Self-pay | Admitting: Family Medicine

## 2013-10-21 VITALS — BP 102/66 | HR 71 | Temp 98.4°F | Resp 18 | Ht 62.0 in | Wt 146.0 lb

## 2013-10-21 DIAGNOSIS — M25569 Pain in unspecified knee: Secondary | ICD-10-CM

## 2013-10-21 DIAGNOSIS — M25561 Pain in right knee: Secondary | ICD-10-CM

## 2013-10-21 DIAGNOSIS — K769 Liver disease, unspecified: Secondary | ICD-10-CM

## 2013-10-21 DIAGNOSIS — M25562 Pain in left knee: Secondary | ICD-10-CM

## 2013-10-21 DIAGNOSIS — R945 Abnormal results of liver function studies: Secondary | ICD-10-CM

## 2013-10-21 DIAGNOSIS — R21 Rash and other nonspecific skin eruption: Secondary | ICD-10-CM

## 2013-10-21 DIAGNOSIS — E781 Pure hyperglyceridemia: Secondary | ICD-10-CM

## 2013-10-21 LAB — HEPATIC FUNCTION PANEL
ALK PHOS: 44 U/L (ref 39–117)
ALT: 55 U/L — AB (ref 0–35)
AST: 41 U/L — ABNORMAL HIGH (ref 0–37)
Albumin: 4.4 g/dL (ref 3.5–5.2)
BILIRUBIN INDIRECT: 0.5 mg/dL (ref 0.0–0.9)
Bilirubin, Direct: 0.1 mg/dL (ref 0.0–0.3)
TOTAL PROTEIN: 7 g/dL (ref 6.0–8.3)
Total Bilirubin: 0.6 mg/dL (ref 0.3–1.2)

## 2013-10-21 MED ORDER — TRIAMCINOLONE ACETONIDE 0.1 % EX CREA: 1.0000 "application " | TOPICAL_CREAM | Freq: Two times a day (BID) | CUTANEOUS | Status: DC

## 2013-10-21 NOTE — Progress Notes (Addendum)
[facility] [address], [place] [address] [phone]  Date:  10/21/2013   Name:  Aimee Figueroa   DOB:  12/17/1970   MRN:  [id number]  PCP:  No primary provider on file.    Chief Complaint: Rash and Knee Pain   History of Present Illness:  Aimee Figueroa is a 43 y.o. very pleasant female patient who presents with the following:  She has noted an itchy rash just around her neck for about one month.  It is not anywhere else on her body. She is not aware of any cause of this rash.  However she does admit that she has been worried recently about her son's frequent OM and wonders if this could have triggered the rash.    She also notes a tender area on the right side of her neck for 2 weeks.   In early January she went to another office and had some labs done.  She brings these with her today; her triglycerides are high (CHL otherwise good, and her AST/ ALT were slightly high, in the 60- 70 range.)   Otherwise all looked ok.  She is concerned about her LFTs and wants to have these checked again  She has started taking omega 3's for about 3 weeks now. She has also started doing zumba 4x a week  She also notes pain in both knees- right more than left- for a few months. She is not aware of any injury.  She notes more pain with climbing stairs but doing zumba makes her feel better.    LMP 1/10  Patient Active Problem List   Diagnosis Date Noted  . AMA (advanced maternal age) multigravida 35+ 10/01/2011  . History of cesarean section 10/01/2011    Past Medical History  Diagnosis Date  . Abnormal Pap smear   . Depression   . Tuberculosis     43 yrs old    Past Surgical History  Procedure Laterality Date  . Cesarean section      History  Substance Use Topics  . Smoking status: Never Smoker   . Smokeless tobacco: Not on file  . Alcohol Use: No    Family History  Problem Relation Age of Onset  . Diabetes Father   . Anesthesia problems Neg Hx      Allergies  Allergen Reactions  . Tylenol [Acetaminophen] Rash    Medication list has been reviewed and updated.  Current Outpatient Prescriptions on File Prior to Visit  Medication Sig Dispense Refill  . citalopram (CELEXA) 10 MG tablet Take 1 tablet (10 mg total) by mouth daily.  30 tablet  1  . phenazopyridine (PYRIDIUM) 200 MG tablet Take 1 tablet (200 mg total) by mouth 3 (three) times daily as needed for pain.  10 tablet  1   No current facility-administered medications on file prior to visit.    Review of Systems:  As per HPI- otherwise negative.  Physical Examination: Filed Vitals:   10/21/13 0846  BP: 102/66  Pulse: 71  Temp: 98.4 F (36.9 C)  Resp: 18   Filed Vitals:   10/21/13 0846  Height: 5\' 2"  (1.575 m)  Weight: 146 lb (66.225 kg)   Body mass index is 26.7 kg/(m^2). Ideal Body Weight: Weight in (lb) to have BMI = 25: 136.4  GEN: WDWN, NAD, Non-toxic, A & O x 3, mild overweight, looks well HEENT: Atraumatic, Normocephalic. Neck supple. No masses, No LAD. Ears and Nose: No external deformity. CV:  RRR, No M/G/R. No JVD. No thrill. No extra heart sounds. PULM: CTA B, no wheezes, crackles, rhonchi. No retractions. No resp. distress. No accessory muscle use. EXTR: No c/c/e NEURO Normal gait.  PSYCH: Normally interactive. Conversant. Not depressed or anxious appearing.  Calm demeanor.  Knees: no effusion ,redness or swelling bilaterally.  No crepitus. No instability, no heat.   Skin: there is a ring of patchy hyperpigmentation around her neck with a few areas of palpably irritated skin  Assessment and Plan: Liver function abnormality - Plan: Hepatic Function Panel  Rash and nonspecific skin eruption - Plan: triamcinolone cream (KENALOG) 0.1 %  Knee pain, bilateral  See patient instructions for more details.    Signed Lamar Blinks, MD  1/24: called her to go over labs.  Her AST and ALT are improved.  Not yet normal but better.  She is pleased  to hear this news.  encouraged her to continue her weight loss and omega- 3's, and come in for fasting CHL and LFT in about one month,  She agrees

## 2013-10-21 NOTE — Patient Instructions (Signed)
I will get in touch with you regarding your labs from today.  The Omega 3 is a good idea to lower your triglycerides. If your liver function looks better we can also add a medication called niacin.  Losing a few pounds and eating a low fat diet will also help  Try the cream once or twice a day for the rash on your neck. The brownish discoloration may last for a few months.  However if the itching does not go away please let me know  Your knees probably hurt just from use!  Try taking some OTC ibuprofen or aleve for this as needed.   Losing a few pounds will help here too

## 2013-10-22 ENCOUNTER — Encounter: Payer: Self-pay | Admitting: Family Medicine

## 2013-10-22 NOTE — Addendum Note (Signed)
Addended by: Lamar Blinks C on: 10/22/2013 12:44 PM   Modules accepted: Orders

## 2013-10-31 ENCOUNTER — Ambulatory Visit (HOSPITAL_COMMUNITY)
Admission: RE | Admit: 2013-10-31 | Discharge: 2013-10-31 | Disposition: A | Discharge status: Home / Self Care | Payer: Self-pay | Source: Ambulatory Visit | Attending: Family Medicine | Admitting: Family Medicine

## 2013-10-31 DIAGNOSIS — Z1231 Encounter for screening mammogram for malignant neoplasm of breast: Secondary | ICD-10-CM

## 2013-12-14 ENCOUNTER — Ambulatory Visit: Payer: Self-pay | Admitting: Family Medicine

## 2013-12-14 VITALS — BP 108/62 | HR 73 | Temp 98.7°F | Resp 16 | Ht 62.5 in | Wt 144.2 lb

## 2013-12-14 DIAGNOSIS — R6884 Jaw pain: Secondary | ICD-10-CM

## 2013-12-14 DIAGNOSIS — L259 Unspecified contact dermatitis, unspecified cause: Secondary | ICD-10-CM

## 2013-12-14 DIAGNOSIS — L309 Dermatitis, unspecified: Secondary | ICD-10-CM

## 2013-12-14 DIAGNOSIS — F411 Generalized anxiety disorder: Secondary | ICD-10-CM

## 2013-12-14 DIAGNOSIS — R748 Abnormal levels of other serum enzymes: Secondary | ICD-10-CM

## 2013-12-14 DIAGNOSIS — K760 Fatty (change of) liver, not elsewhere classified: Secondary | ICD-10-CM

## 2013-12-14 DIAGNOSIS — M542 Cervicalgia: Secondary | ICD-10-CM

## 2013-12-14 DIAGNOSIS — K7689 Other specified diseases of liver: Secondary | ICD-10-CM

## 2013-12-14 LAB — COMPREHENSIVE METABOLIC PANEL
ALK PHOS: 46 U/L (ref 39–117)
ALT: 80 U/L — ABNORMAL HIGH (ref 0–35)
AST: 41 U/L — AB (ref 0–37)
Albumin: 4.5 g/dL (ref 3.5–5.2)
BUN: 8 mg/dL (ref 6–23)
CHLORIDE: 104 meq/L (ref 96–112)
CO2: 23 mEq/L (ref 19–32)
Calcium: 9.3 mg/dL (ref 8.4–10.5)
Creat: 0.48 mg/dL — ABNORMAL LOW (ref 0.50–1.10)
Glucose, Bld: 104 mg/dL — ABNORMAL HIGH (ref 70–99)
Potassium: 4 mEq/L (ref 3.5–5.3)
Sodium: 137 mEq/L (ref 135–145)
Total Bilirubin: 0.7 mg/dL (ref 0.2–1.2)
Total Protein: 7.6 g/dL (ref 6.0–8.3)

## 2013-12-14 LAB — LIPID PANEL
CHOL/HDL RATIO: 3.1 ratio
Cholesterol: 148 mg/dL (ref 0–200)
HDL: 47 mg/dL (ref >39)
LDL Cholesterol: 62 mg/dL (ref 0–99)
TRIGLYCERIDES: 194 mg/dL — AB (ref <150)
VLDL: 39 mg/dL (ref 0–40)

## 2013-12-14 LAB — POCT CBC
Granulocyte percent: 54.3 %G (ref 37–80)
HCT, POC: 42.3 % (ref 37.7–47.9)
Hemoglobin: 13.6 g/dL (ref 12.2–16.2)
Lymph, poc: 1.5 (ref 0.6–3.4)
MCH, POC: 30.6 pg (ref 27–31.2)
MCHC: 32.2 g/dL (ref 31.8–35.4)
MCV: 95.1 fL (ref 80–97)
MID (CBC): 0.4 (ref 0–0.9)
MPV: 10 fL (ref 0–99.8)
PLATELET COUNT, POC: 170 10*3/uL (ref 142–424)
POC Granulocyte: 2.2 (ref 2–6.9)
POC LYMPH %: 37.1 % (ref 10–50)
POC MID %: 8.6 %M (ref 0–12)
RBC: 4.45 M/uL (ref 4.04–5.48)
RDW, POC: 14.2 %
WBC: 4.1 10*3/uL — AB (ref 4.6–10.2)

## 2013-12-14 MED ORDER — MELOXICAM 7.5 MG PO TABS: ORAL_TABLET | ORAL | Status: DC

## 2013-12-14 MED ORDER — HYDROXYZINE PAMOATE 25 MG PO CAPS: ORAL_CAPSULE | ORAL | Status: DC

## 2013-12-14 NOTE — Progress Notes (Signed)
Subjective: 43 year old lady who is here for several things. She was asked to come back in followup. She has a history of being diagnosed at the clinic [place] several months ago with having hyperlipidemia and a fatty liver. About 3 months ago she developed a rash around her neck which has continued to persist. Dr. Edilia Bo gave her some triamcinolone cream to use on there. She's been having pain in her jaw and neck and tightness. No other specific lesions. She stays very active and busy both at home and in her church. Her husband works and is gone much of the time. She has 5 children ages 40-17. She spent a lot of time the first year and a half of her babies life feeling guilty and depressed goose she had not nursed the child, and he had a lot of illness during his early months. This was difficult for her. She feels like she has gotten past that, is not depressed like she was.  She also is under some stress with a friend who has a malignancy in her neck. This certainly has worried her.  Objective: Pleasant healthy appearing lady in no major distress. TMs normal. Throat clear. Neck supple without significant nodes. Chest clear. Heart regular without murmurs. She abdomen soft without mass or tenderness. Her neck has a mild old dark skin discoloration around the base of the neck. This is not in claims or raised. It simply looks like a mild hyperpigmentation. She has a similar appearing area on her right low back but that is a birthmark. This has been there several months around her neck. She also has numerous skin tags around the neck. These are unrelated to the rash.  Assessment: Fatty liver Abnormal liver enzymes Nonspecific pigmented dermatitis of neck Skin tags JAw pain/cervical pain Anxiety  Plan: Check CBC, lipids, CMET  Results for orders placed in visit on 12/14/13  POCT CBC      Result Value Ref Range   WBC 4.1 (*) 4.6 - 10.2 K/uL   Lymph, poc 1.5  0.6 - 3.4   POC LYMPH  PERCENT 37.1  10 - 50 %L   MID (cbc) 0.4  0 - 0.9   POC MID % 8.6  0 - 12 %M   POC Granulocyte 2.2  2 - 6.9   Granulocyte percent 54.3  37 - 80 %G   RBC 4.45  4.04 - 5.48 M/uL   Hemoglobin 13.6  12.2 - 16.2 g/dL   HCT, POC 42.3  37.7 - 47.9 %   MCV 95.1  80 - 97 fL   MCH, POC 30.6  27 - 31.2 pg   MCHC 32.2  31.8 - 35.4 g/dL   RDW, POC 14.2     Platelet Count, POC 170  142 - 424 K/uL   MPV 10.0  0 - 99.8 fL   Will treat for the tension and pain in the jaw. I think the rash will gradually resolve. Advised against rubbing the rash at all. Return in 3 months to recheck if not much better. Return sooner if worse.

## 2013-12-14 NOTE — Patient Instructions (Signed)
Ask the pharmacist if he can locate an over-the-counter medication that has been angry and said that Naphcon A. had. Use the drops as needed for allergy  Take Zyrtec one each morning for allergies  Take hydroxyzine 25 mg one at bedtime for anxiety and tension  Take meloxicam one daily for jaw pains  If the rash is not doing better over the next 3 months I want you to return. If he gets worse at anytime return. However I think if you will give it a few months it will continue to fade  Return if problems

## 2014-01-06 ENCOUNTER — Ambulatory Visit: Payer: Self-pay

## 2014-02-01 ENCOUNTER — Ambulatory Visit: Payer: Self-pay | Admitting: Family Medicine

## 2014-02-01 ENCOUNTER — Ambulatory Visit: Payer: Self-pay

## 2014-02-01 VITALS — BP 108/58 | HR 83 | Temp 97.8°F | Resp 16 | Ht 61.25 in | Wt 150.4 lb

## 2014-02-01 DIAGNOSIS — R21 Rash and other nonspecific skin eruption: Secondary | ICD-10-CM

## 2014-02-01 DIAGNOSIS — R519 Headache, unspecified: Secondary | ICD-10-CM

## 2014-02-01 DIAGNOSIS — R51 Headache: Secondary | ICD-10-CM

## 2014-02-01 DIAGNOSIS — R748 Abnormal levels of other serum enzymes: Secondary | ICD-10-CM

## 2014-02-01 DIAGNOSIS — R635 Abnormal weight gain: Secondary | ICD-10-CM

## 2014-02-01 LAB — POCT GLYCOSYLATED HEMOGLOBIN (HGB A1C): Hemoglobin A1C: 5.2

## 2014-02-01 MED ORDER — KETOCONAZOLE 2 % EX CREA: 1.0000 "application " | TOPICAL_CREAM | Freq: Every day | CUTANEOUS | Status: DC

## 2014-02-01 MED ORDER — TRIAMCINOLONE ACETONIDE 0.1 % EX CREA: 1.0000 "application " | TOPICAL_CREAM | Freq: Two times a day (BID) | CUTANEOUS | Status: DC

## 2014-02-01 NOTE — Progress Notes (Signed)
Chief Complaint:  Chief Complaint  Patient presents with  . Rash    pruritic rash/skin discoloration on neck x 6 months  . Jaw Pain  . Neck Pain    pain and burning sensation in her neck that radiates into her scalp x1 month  . Lymphadenopathy    swollen nodes under her arms    HPI: Aimee Figueroa is a 43 y.o. female who is here for : 1. recheck of rash on her neck used triamcinolone, improved about 50 % but still present. She has had it for about 6 months or more, it is just around her neck and alittle bi on her upper chest. Sh has some itching with this but is more worried that it may be something a little bigger than just a "normal rash" . No new meds, soaps, lotions, clothes. She denies sweating a lot.  2. She is also worried about weight gain, she is worried about swollen glands and armpit pain bilaterally, she does not have odynophagia or duysphagia, she feels like something is stuck there, she has GERD but has not bee on a PPI. She does have prilosec pills for GERD which she does not take 3. She is also worried about a burning feeling all over her head, has not dyed her hair because of it.  4. 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   She is not taking her celexa or vistaril, she felt she was taking so much medicine she did not know if it was making her liver enzymes elevated, the celexa did help her when she was taking it.     Past Medical History  Diagnosis Date  . Abnormal Pap smear   . Depression   . Tuberculosis     43 yrs old   Past Surgical History  Procedure Laterality Date  . Cesarean section     History   Social History  . Marital Status: Single    Spouse Name: N/A    Number of Children: N/A  . Years of Education: N/A   Social History Main Topics  . Smoking status: Never Smoker   . Smokeless tobacco: None  . Alcohol Use: No  . Drug Use: No  . Sexual  Activity: Yes     Comment: was using pill    Other Topics Concern  . None   Social History Narrative  . None   Family History  Problem Relation Age of Onset  . Diabetes Father   . Anesthesia problems Neg Hx    Allergies  Allergen Reactions  . Tylenol [Acetaminophen] Rash   Prior to Admission medications   Medication Sig Start Date End Date Taking? Authorizing Provider  cetirizine (ZYRTEC) 10 MG tablet Take 10 mg by mouth daily.   Yes Historical Provider, MD  meloxicam (MOBIC) 7.5 MG tablet Take one daily for jaw pain 12/14/13  Yes Posey Boyer, MD  citalopram (CELEXA) 10 MG tablet Take 1 tablet (10 mg total) by mouth daily. 03/13/12 03/13/13  Lyndee Hensen, MD  hydrOXYzine (VISTARIL) 25 MG capsule Take one or 2 at bedtime for tension in jaw 12/14/13   Posey Boyer, MD     ROS: The patient denies fevers, chills, night sweats, unintentional weight loss, chest pain, palpitations, wheezing, dyspnea on exertion, nausea, vomiting, abdominal pain, dysuria, hematuria, melena, numbness, weakness, or tingling.   All  other systems have been reviewed and were otherwise negative with the exception of those mentioned in the HPI and as above.    PHYSICAL EXAM: Filed Vitals:   02/01/14 1632  BP: 108/58  Pulse: 83  Temp: 97.8 F (36.6 C)  Resp: 16  spo2 97% Filed Vitals:   02/01/14 1632  Height: 5' 1.25" (1.556 m)  Weight: 150 lb 6.4 oz (68.221 kg)   Body mass index is 28.18 kg/(m^2).  General: Alert, no acute distress HEENT:  Normocephalic, atraumatic, oropharynx patent. EOMI, PERRLA, TM nl, no exudates, no thyroidmegaly Cardiovascular:  Regular rate and rhythm, no rubs murmurs or gallops.  No Carotid bruits, radial pulse intact. No pedal edema.  Respiratory: Clear to auscultation bilaterally.  No wheezes, rales, or rhonchi.  No cyanosis, no use of accessory musculature GI: No organomegaly, abdomen is soft and non-tender, positive bowel sounds.  No masses. Skin: + eczematous  patches on neck area, ? Tinea versicolor vs just eczema, she has some soft tissue fullness on her occiput that is her normal ridge of her skull Neurologic: Facial musculature symmetric. Psychiatric: Patient is appropriate throughout our interaction. Lymphatic: No cervical lymphadenopathy Musculoskeletal: Gait intact.   LABS: Results for orders placed in visit on 02/01/14  POCT GLYCOSYLATED HEMOGLOBIN (HGB A1C)      Result Value Ref Range   Hemoglobin A1C 5.2       EKG/XRAY:   Primary read interpreted by Dr. Marin Comment at [facility]. No fracture dislocation No masses Soft tissue is normal   ASSESSMENT/PLAN: Encounter Diagnoses  Name Primary?  . Weight gain Yes  . Rash and nonspecific skin eruption   . Pain of occiput    Rx Ketoconazole trial to see if this may be tinea since she has itching, if no improvement then just continue with triamcinolone cream since it seems to be helping her slowly Rx triamcinolone if ketoconazole doe not work I gave her more reassurance then anything regarding all her vague symptoms. She is very anxious and worried due to her friend's dx that she may have something like GI cancer that we are missdiagnosiing.  She does not want restart her celexa at this time, if she wants it then she will call me for refills.  She will start taking her prilosec and see if her sxs arer better TSH pending If throat sxs do not improve consider referral prn GI vs ENT prn She will return i2-3 months to get liver enzymes rechecked, labs only   Gross sideeffects, risk and benefits, and alternatives of medications d/w patient. Patient is aware that all medications have potential sideeffects and we are unable to predict every sideeffect or drug-drug interaction that may occur.  Glenford Bayley, DO 02/01/2014 6:30 PM

## 2014-02-02 LAB — TSH: TSH: 0.893 u[IU]/mL (ref 0.350–4.500)

## 2014-02-10 ENCOUNTER — Telehealth: Payer: Self-pay

## 2014-02-10 NOTE — Telephone Encounter (Signed)
Dr. Marin Comment:  You called patient last week and asked her if she wanted a referral to the dermatologist.  She has decided that she does. Can you please make Aimee Figueroa an appointment with the dermatologist.  Her number is [phone].

## 2014-02-13 ENCOUNTER — Other Ambulatory Visit: Payer: Self-pay | Admitting: Family Medicine

## 2014-02-13 DIAGNOSIS — R21 Rash and other nonspecific skin eruption: Secondary | ICD-10-CM

## 2014-02-13 NOTE — Telephone Encounter (Signed)
I didn't see anything in your note about a referral to derm. Is this ok to do?

## 2014-02-13 NOTE — Telephone Encounter (Signed)
Referred to derm please let her know. Thanks

## 2014-02-13 NOTE — Telephone Encounter (Signed)
Pt would like to be referred to dermatologist, said when she was here last time Dr.Le suggested it, and she wanted to think about it. [phone]

## 2014-02-14 NOTE — Telephone Encounter (Signed)
Pt.notified

## 2014-03-17 ENCOUNTER — Ambulatory Visit (INDEPENDENT_AMBULATORY_CARE_PROVIDER_SITE_OTHER): Payer: Self-pay | Admitting: Family Medicine

## 2014-03-17 VITALS — BP 112/72 | HR 73 | Temp 97.8°F | Resp 18 | Ht 61.0 in | Wt 147.2 lb

## 2014-03-17 DIAGNOSIS — R209 Unspecified disturbances of skin sensation: Secondary | ICD-10-CM

## 2014-03-17 DIAGNOSIS — R202 Paresthesia of skin: Secondary | ICD-10-CM

## 2014-03-17 DIAGNOSIS — L989 Disorder of the skin and subcutaneous tissue, unspecified: Secondary | ICD-10-CM

## 2014-03-17 DIAGNOSIS — L909 Atrophic disorder of skin, unspecified: Secondary | ICD-10-CM

## 2014-03-17 DIAGNOSIS — L918 Other hypertrophic disorders of the skin: Secondary | ICD-10-CM

## 2014-03-17 DIAGNOSIS — L919 Hypertrophic disorder of the skin, unspecified: Secondary | ICD-10-CM

## 2014-03-17 MED ORDER — LORAZEPAM 0.5 MG PO TABS: 0.5000 mg | ORAL_TABLET | Freq: Two times a day (BID) | ORAL | Status: DC | PRN

## 2014-03-17 MED ORDER — LORAZEPAM 0.5 MG PO TABS: ORAL_TABLET | ORAL | Status: DC

## 2014-03-17 NOTE — Patient Instructions (Signed)
Keep the appointment with the neurologist  Lorazepam 0.5 mg once or twice daily to try and calm the sensitivity of the skin.  If symptoms continue to persist let me know. May need to send you to a dermatologist at [facility] or one of the other [place]

## 2014-03-17 NOTE — Progress Notes (Signed)
Subjective: Patient is here for a recheck with regard to the pigmented rash on her neck and the sensitive skin irritation she has on the neck and scalp. She saw Dr. Marin Comment one time since I saw her last. She has been evaluated with blood work which was all normal. She was referred to a dermatologist, and we do not have that report yet. The dermatologist referral to a neurologist who will see her sometime in the near future. She has the discoloration around the neck but keeps getting a sensitivity to the skin of the neck. It hurts when she turns it one way or the other. She has a scalp but is very sensitive to touch also, such that she is reluctant to go getting her hair dyed or do other things because she doesn't want to make anything worse.  Objective Alert oriented lady in no acute distress. Has a somewhat mottled brownish discoloration of the skin around the neck which really isn't a rash. It is more just a irregular dark pigmentation. No nodes can be palpated though she feels tender in the sternocleidomastoid region bilaterally. Her scalp also appears normal. She has multiple fibrocutaneous skin tags around her neck. She is not diabetic.  Assessment: Abnormal skin pigmentation on neck Paresthesia of scalp and neck Fibrocutaneous skin tags  Plan: Try and get the dermatology report

## 2014-09-06 ENCOUNTER — Encounter: Payer: Self-pay | Admitting: Internal Medicine

## 2014-09-06 ENCOUNTER — Ambulatory Visit: Payer: Self-pay | Attending: Internal Medicine | Admitting: Internal Medicine

## 2014-09-06 ENCOUNTER — Ambulatory Visit (HOSPITAL_BASED_OUTPATIENT_CLINIC_OR_DEPARTMENT_OTHER): Payer: Self-pay

## 2014-09-06 VITALS — BP 124/77 | HR 104 | Temp 97.8°F | Resp 17 | Wt 147.4 lb

## 2014-09-06 DIAGNOSIS — Q828 Other specified congenital malformations of skin: Secondary | ICD-10-CM | POA: Insufficient documentation

## 2014-09-06 DIAGNOSIS — R945 Abnormal results of liver function studies: Secondary | ICD-10-CM

## 2014-09-06 DIAGNOSIS — R51 Headache: Secondary | ICD-10-CM | POA: Insufficient documentation

## 2014-09-06 DIAGNOSIS — F32A Depression, unspecified: Secondary | ICD-10-CM | POA: Insufficient documentation

## 2014-09-06 DIAGNOSIS — G8929 Other chronic pain: Secondary | ICD-10-CM

## 2014-09-06 DIAGNOSIS — L819 Disorder of pigmentation, unspecified: Secondary | ICD-10-CM | POA: Insufficient documentation

## 2014-09-06 DIAGNOSIS — L299 Pruritus, unspecified: Secondary | ICD-10-CM | POA: Insufficient documentation

## 2014-09-06 DIAGNOSIS — Z791 Long term (current) use of non-steroidal anti-inflammatories (NSAID): Secondary | ICD-10-CM | POA: Insufficient documentation

## 2014-09-06 DIAGNOSIS — R519 Headache, unspecified: Secondary | ICD-10-CM | POA: Insufficient documentation

## 2014-09-06 DIAGNOSIS — F329 Major depressive disorder, single episode, unspecified: Secondary | ICD-10-CM | POA: Insufficient documentation

## 2014-09-06 DIAGNOSIS — Z23 Encounter for immunization: Secondary | ICD-10-CM

## 2014-09-06 DIAGNOSIS — R7989 Other specified abnormal findings of blood chemistry: Secondary | ICD-10-CM | POA: Insufficient documentation

## 2014-09-06 DIAGNOSIS — E781 Pure hyperglyceridemia: Secondary | ICD-10-CM | POA: Insufficient documentation

## 2014-09-06 LAB — COMPLETE METABOLIC PANEL WITH GFR
ALT: 41 U/L — ABNORMAL HIGH (ref 0–35)
AST: 29 U/L (ref 0–37)
Albumin: 4.1 g/dL (ref 3.5–5.2)
Alkaline Phosphatase: 53 U/L (ref 39–117)
BUN: 8 mg/dL (ref 6–23)
CALCIUM: 8.9 mg/dL (ref 8.4–10.5)
CHLORIDE: 103 meq/L (ref 96–112)
CO2: 26 meq/L (ref 19–32)
CREATININE: 0.51 mg/dL (ref 0.50–1.10)
GFR, Est Non African American: >89 mL/min
GLUCOSE: 88 mg/dL (ref 70–99)
Potassium: 3.9 mEq/L (ref 3.5–5.3)
Sodium: 138 mEq/L (ref 135–145)
Total Bilirubin: 0.4 mg/dL (ref 0.2–1.2)
Total Protein: 7 g/dL (ref 6.0–8.3)

## 2014-09-06 MED ORDER — HYDROCORTISONE 1 % EX CREA: 1.0000 "application " | TOPICAL_CREAM | Freq: Two times a day (BID) | CUTANEOUS | Status: DC

## 2014-09-06 NOTE — Progress Notes (Signed)
Patient Demographics  Aimee Figueroa, is a 43 y.o. female  LOV:[id number]  OAC:[id number]  DOB - Nov 22, 1970  CC:  Chief Complaint  Patient presents with  . Establish Care       HPI: Aimee Figueroa is a 43 y.o. female here today to establish medical care.patient has history of chronic neck/ headache, she has seen neurologist and is started on Pamelor and she reports some improvement, patient also reports history of depression and is per patient this medication also help her with the symptoms denies any SI or HI, she also reported to have skin depigmentation around her neck and several skin tags, she's concerned and is requesting referral to see a dermatologist, also history of high triglycerides and is taking fish oil omega-3, previous blood work reviewed her noticed abnormal liver enzymes, she denies drinking alcohol denies any IV drug abuse. Patient has No headache, No chest pain, No abdominal pain - No Nausea, No new weakness tingling or numbness, No Cough - SOB.  Allergies  Allergen Reactions  . Tylenol [Acetaminophen] Rash   Past Medical History  Diagnosis Date  . Abnormal Pap smear   . Depression   . Tuberculosis     43 yrs old   Current Outpatient Prescriptions on File Prior to Visit  Medication Sig Dispense Refill  . cetirizine (ZYRTEC) 10 MG tablet Take 10 mg by mouth daily.    . citalopram (CELEXA) 10 MG tablet Take 1 tablet (10 mg total) by mouth daily. 30 tablet 1  . hydrOXYzine (VISTARIL) 25 MG capsule Take one or 2 at bedtime for tension in jaw 60 capsule 1  . ketoconazole (NIZORAL) 2 % cream Apply 1 application topically daily. 15 g 0  . LORazepam (ATIVAN) 0.5 MG tablet Take one twice daily as needed to calm burning irritation of skin on neck and head 30 tablet 1  . meloxicam (MOBIC) 7.5 MG tablet Take one daily for jaw pain 20 tablet 0  . triamcinolone cream (KENALOG) 0.1 % Apply 1 application topically 2 (two) times daily. 30 g 1   No current  facility-administered medications on file prior to visit.   Family History  Problem Relation Age of Onset  . Diabetes Father   . Anesthesia problems Neg Hx    History   Social History  . Marital Status: Single    Spouse Name: N/A    Number of Children: N/A  . Years of Education: N/A   Occupational History  . Not on file.   Social History Main Topics  . Smoking status: Never Smoker   . Smokeless tobacco: Not on file  . Alcohol Use: No  . Drug Use: No  . Sexual Activity: Yes     Comment: was using pill    Other Topics Concern  . Not on file   Social History Narrative    Review of Systems: Constitutional: Negative for fever, chills, diaphoresis, activity change, appetite change and fatigue. HENT: Negative for ear pain, nosebleeds, congestion, facial swelling, rhinorrhea, neck pain, neck stiffness and ear discharge.  Eyes: Negative for pain, discharge, redness, itching and visual disturbance. Respiratory: Negative for cough, choking, chest tightness, shortness of breath, wheezing and stridor.  Cardiovascular: Negative for chest pain, palpitations and leg swelling. Gastrointestinal: Negative for abdominal distention. Genitourinary: Negative for dysuria, urgency, frequency, hematuria, flank pain, decreased urine volume, difficulty urinating and dyspareunia.  Musculoskeletal: Negative for back pain, joint swelling, arthralgia and gait problem. Neurological: Negative for dizziness, tremors, seizures, syncope, facial asymmetry, speech  difficulty, weakness, light-headedness, numbness and headaches.  Hematological: Negative for adenopathy. Does not bruise/bleed easily. Psychiatric/Behavioral: Negative for hallucinations, behavioral problems, confusion, dysphoric mood, decreased concentration and agitation.    Objective:   Filed Vitals:   09/06/14 1520  BP: 124/77  Pulse: 104  Temp: 97.8 F (36.6 C)  Resp: 17    Physical Exam: Constitutional: Patient appears  well-developed and well-nourished. No distress. HENT: Normocephalic, atraumatic, External right and left ear normal. Oropharynx is clear and moist.  Eyes: Conjunctivae and EOM are normal. PERRLA, no scleral icterus. Neck: Normal ROM. Neck supple. No JVD. No tracheal deviation. No thyromegaly. CVS: RRR, S1/S2 +, no murmurs, no gallops, no carotid bruit.  Pulmonary: Effort and breath sounds normal, no stridor, rhonchi, wheezes, rales.  Abdominal: Soft. BS +, no distension, tenderness, rebound or guarding.  Musculoskeletal: Normal range of motion. No edema and no tenderness.  Neuro: Alert. Normal reflexes, muscle tone coordination. No cranial nerve deficit. Skin: Skin depigmentation around the neck  Skin tags. Psychiatric: Normal mood and affect. Behavior, judgment, thought content normal.  Lab Results  Component Value Date   WBC 4.1* 12/14/2013   HGB 13.6 12/14/2013   HCT 42.3 12/14/2013   MCV 95.1 12/14/2013   PLT 113* 03/12/2012   Lab Results  Component Value Date   CREATININE 0.48* 12/14/2013   BUN 8 12/14/2013   NA 137 12/14/2013   K 4.0 12/14/2013   CL 104 12/14/2013   CO2 23 12/14/2013    Lab Results  Component Value Date   HGBA1C 5.2 02/01/2014   Lipid Panel     Component Value Date/Time   CHOL 148 12/14/2013 0924   TRIG 194* 12/14/2013 0924   HDL 47 12/14/2013 0924   CHOLHDL 3.1 12/14/2013 0924   VLDL 39 12/14/2013 0924   LDLCALC 62 12/14/2013 0924       Assessment and plan:   1. Chronic nonintractable headache, unspecified headache type Currently on nortriptyline following up with her neurologist  2. Skin depigmentation/3. Accessory skin tags  - Ambulatory referral to Dermatology  4. High triglycerides Advised patient for low fat diet currently patient is taking lovaza   5. Needs flu shot Flu shot given today   6. Depression Currently patient is on pamelor   7. Abnormal LFTs  - COMPLETE METABOLIC PANEL WITH GFR - Hepatitis B surface  antibody - Hepatitis B surface antigen - Hepatitis C antibody - Vit D  25 hydroxy (rtn osteoporosis monitoring)  8. Itching Occasionally patient has itching in the neck area - hydrocortisone cream 1 %; Apply 1 application topically 2 (two) times daily.  Dispense: 30 g; Refill: 1 Health Maintenance  -Pap Smear: patient will schedule apt with her GYN -Mammogram: uptodate  -Vaccinations:  Flu shot today   Return in about 3 months (around 12/06/2014), or if symptoms worsen or fail to improve.   Lorayne Marek, MD

## 2014-09-06 NOTE — Progress Notes (Signed)
Patient here to establish care Complains of skin pigmentations around her neck that has been there for about a year At times the area is itchy and burns Complains of soreness to her upper arms and collar bone area

## 2014-09-07 LAB — HEPATITIS B SURFACE ANTIBODY,QUALITATIVE: HEP B S AB: NEGATIVE

## 2014-09-07 LAB — HEPATITIS B SURFACE ANTIGEN: Hepatitis B Surface Ag: NEGATIVE

## 2014-09-07 LAB — VITAMIN D 25 HYDROXY (VIT D DEFICIENCY, FRACTURES): Vit D, 25-Hydroxy: 12 ng/mL — ABNORMAL LOW (ref 30–100)

## 2014-09-07 LAB — HEPATITIS C ANTIBODY: HCV Ab: NEGATIVE

## 2014-09-08 ENCOUNTER — Telehealth: Payer: Self-pay

## 2014-09-08 MED ORDER — VITAMIN D (ERGOCALCIFEROL) 1.25 MG (50000 UNIT) PO CAPS: 50000.0000 [IU] | ORAL_CAPSULE | ORAL | Status: DC

## 2014-09-08 NOTE — Telephone Encounter (Signed)
-----   Message from Lorayne Marek, MD sent at 09/07/2014  1:16 PM EST ----- Blood work reviewed, noticed low vitamin D, call patient advise to start ergocalciferol 50,000 units once a week for the duration of  12 weeks. Also her liver function test is improving and  Her hepatitis panel is negative.

## 2014-09-08 NOTE — Telephone Encounter (Signed)
Spoke with patient and she is aware of her blood results [facility]

## 2014-09-25 ENCOUNTER — Ambulatory Visit: Payer: Self-pay | Attending: Internal Medicine

## 2014-10-11 ENCOUNTER — Other Ambulatory Visit: Payer: Self-pay | Admitting: Family Medicine

## 2014-10-11 DIAGNOSIS — Z1231 Encounter for screening mammogram for malignant neoplasm of breast: Secondary | ICD-10-CM

## 2014-12-21 ENCOUNTER — Other Ambulatory Visit: Payer: Self-pay | Admitting: Obstetrics and Gynecology

## 2014-12-21 DIAGNOSIS — Z1231 Encounter for screening mammogram for malignant neoplasm of breast: Secondary | ICD-10-CM

## 2014-12-26 ENCOUNTER — Ambulatory Visit: Payer: Self-pay | Admitting: Internal Medicine

## 2014-12-29 ENCOUNTER — Encounter: Payer: Self-pay | Admitting: Internal Medicine

## 2014-12-29 ENCOUNTER — Ambulatory Visit: Payer: Self-pay | Attending: Internal Medicine | Admitting: Internal Medicine

## 2014-12-29 VITALS — BP 110/72 | HR 87 | Temp 98.0°F | Resp 16 | Wt 150.2 lb

## 2014-12-29 DIAGNOSIS — Z91048 Other nonmedicinal substance allergy status: Secondary | ICD-10-CM

## 2014-12-29 DIAGNOSIS — E559 Vitamin D deficiency, unspecified: Secondary | ICD-10-CM | POA: Insufficient documentation

## 2014-12-29 DIAGNOSIS — M2550 Pain in unspecified joint: Secondary | ICD-10-CM | POA: Insufficient documentation

## 2014-12-29 DIAGNOSIS — Z9109 Other allergy status, other than to drugs and biological substances: Secondary | ICD-10-CM

## 2014-12-29 MED ORDER — IBUPROFEN 800 MG PO TABS: 800.0000 mg | ORAL_TABLET | Freq: Three times a day (TID) | ORAL | Status: DC | PRN

## 2014-12-29 NOTE — Progress Notes (Signed)
MRN: [id number] Name: Aimee Figueroa  Sex: female Age: 44 y.o. DOB: [birth year]  Allergies: Tylenol  Chief Complaint  Patient presents with  . Knee Pain    HPI: Patient is 44 y.o. female who has is she of environmental allergies, vitamin D deficiency comes today complaining of joint pain especially elbows and both knees, she denies any recent fall or trauma, as per patient she'll start taking vitamin D supplement, patient denies any fever chills, as per patient the repetitive movement versus the elbow pain.  Past Medical History  Diagnosis Date  . Abnormal Pap smear   . Depression   . Tuberculosis     44 yrs old    Past Surgical History  Procedure Laterality Date  . Cesarean section        Medication List       This list is accurate as of: 12/29/14  5:55 PM.  Always use your most recent med list.               cetirizine 10 MG tablet  Commonly known as:  ZYRTEC  Take 10 mg by mouth daily.     citalopram 10 MG tablet  Commonly known as:  CELEXA  Take 1 tablet (10 mg total) by mouth daily.     hydrocortisone cream 1 %  Apply 1 application topically 2 (two) times daily.     hydrOXYzine 25 MG capsule  Commonly known as:  VISTARIL  Take one or 2 at bedtime for tension in jaw     ibuprofen 800 MG tablet  Commonly known as:  ADVIL,MOTRIN  Take 1 tablet (800 mg total) by mouth every 8 (eight) hours as needed.     ketoconazole 2 % cream  Commonly known as:  NIZORAL  Apply 1 application topically daily.     LORazepam 0.5 MG tablet  Commonly known as:  ATIVAN  Take one twice daily as needed to calm burning irritation of skin on neck and head     meloxicam 7.5 MG tablet  Commonly known as:  MOBIC  Take one daily for jaw pain     nortriptyline 25 MG capsule  Commonly known as:  PAMELOR  Take 25 mg by mouth at bedtime. Take two to three tablets at Q[facility]     omega-3 acid ethyl esters 1 G capsule  Commonly known as:  LOVAZA  Take by mouth 2 (two) times daily.     triamcinolone cream 0.1 %  Commonly known as:  KENALOG  Apply 1 application topically 2 (two) times daily.     Vitamin D (Ergocalciferol) 50000 UNITS Caps capsule  Commonly known as:  DRISDOL  Take 1 capsule (50,000 Units total) by mouth every 7 (seven) days.        Meds ordered this encounter  Medications  . ibuprofen (ADVIL,MOTRIN) 800 MG tablet    Sig: Take 1 tablet (800 mg total) by mouth every 8 (eight) hours as needed.    Dispense:  30 tablet    Refill:  1    Immunization History  Administered Date(s) Administered  . Influenza,inj,Quad PF,36+ Mos 09/06/2014  . Tdap 01/02/2012    Family History  Problem Relation Age of Onset  . Diabetes Father   . Anesthesia problems Neg Hx     History  Substance Use Topics  . Smoking status: Never Smoker   . Smokeless tobacco: Not on file  . Alcohol Use: No    Review of Systems   As noted in  HPI  Filed Vitals:   12/29/14 1703  BP: 110/72  Pulse: 87  Temp: 98 F (36.7 C)  Resp: 16    Physical Exam  Physical Exam  Constitutional: No distress.  Eyes: Pupils are equal, round, and reactive to light.  Cardiovascular: Normal rate and regular rhythm.   Pulmonary/Chest: Breath sounds normal. No respiratory distress. She has no wheezes. She has no rales.  Musculoskeletal:  Left elbow minimal tenderness at the lateral epicondyle, no erythema or swelling  Bilateral knees no erythema or swelling or tenderness.    CBC    Component Value Date/Time   WBC 4.1* 12/14/2013 0925   WBC 8.5 03/12/2012 0540   RBC 4.45 12/14/2013 0925   RBC 3.28* 03/12/2012 0540   HGB 13.6 12/14/2013 0925   HGB 10.3* 03/12/2012 0540   HCT 42.3 12/14/2013 0925   HCT 31.3* 03/12/2012 0540   PLT 113* 03/12/2012 0540   MCV 95.1 12/14/2013 0925   MCV 95.4 03/12/2012 0540   LYMPHSABS 1.5 09/10/2011 1035   MONOABS 0.2 09/10/2011 1035   EOSABS 0.0 09/10/2011 1035   BASOSABS 0.0 09/10/2011 1035    CMP     Component Value Date/Time   NA  138 09/06/2014 1551   K 3.9 09/06/2014 1551   CL 103 09/06/2014 1551   CO2 26 09/06/2014 1551   GLUCOSE 88 09/06/2014 1551   BUN 8 09/06/2014 1551   CREATININE 0.51 09/06/2014 1551   CALCIUM 8.9 09/06/2014 1551   PROT 7.0 09/06/2014 1551   ALBUMIN 4.1 09/06/2014 1551   AST 29 09/06/2014 1551   ALT 41* 09/06/2014 1551   ALKPHOS 53 09/06/2014 1551   BILITOT 0.4 09/06/2014 1551   GFRNONAA >89 09/06/2014 1551   GFRAA >89 09/06/2014 1551    Lab Results  Component Value Date/Time   CHOL 148 12/14/2013 09:24 AM    No components found for: HGA1C  Lab Results  Component Value Date/Time   AST 29 09/06/2014 03:51 PM    Assessment and Plan  Joint pain - Plan:prescribed  ibuprofen (ADVIL,MOTRIN) 800 MG tablet  Vitamin D deficiency - Plan: will recheck Vit D  25 hydroxy (rtn osteoporosis monitoring)  Environmental allergies Patient is advised to take over-the-counter Zyrtec, patient has nasal spray which she'll start using.   Return in about 3 months (around 03/30/2015), or if symptoms worsen or fail to improve.   This note has been created with Surveyor, quantity. Any transcriptional errors are unintentional.    Lorayne Marek, MD

## 2014-12-29 NOTE — Progress Notes (Signed)
Patient here for follow up Complains of bilateral knee pain and pain to both of her elbows At times if she moves a certain way or lifts wrong the elbows are very painful

## 2014-12-30 LAB — VITAMIN D 25 HYDROXY (VIT D DEFICIENCY, FRACTURES): Vit D, 25-Hydroxy: 16 ng/mL — ABNORMAL LOW (ref 30–100)

## 2015-01-02 ENCOUNTER — Telehealth: Payer: Self-pay

## 2015-01-02 MED ORDER — VITAMIN D (ERGOCALCIFEROL) 1.25 MG (50000 UNIT) PO CAPS: 50000.0000 [IU] | ORAL_CAPSULE | ORAL | Status: DC

## 2015-01-02 NOTE — Telephone Encounter (Signed)
-----   Message from Lorayne Marek, MD sent at 01/01/2015  9:15 AM EDT ----- Blood work reviewed, noticed low vitamin D, call patient advise to start ergocalciferol 50,000 units once a week for the duration of  12 weeks.

## 2015-01-02 NOTE — Telephone Encounter (Signed)
Patient is aware of her lab results Prescription sent to [facility] on [address]

## 2015-01-11 ENCOUNTER — Encounter (HOSPITAL_COMMUNITY): Payer: Self-pay

## 2015-01-11 ENCOUNTER — Ambulatory Visit
Admission: RE | Admit: 2015-01-11 | Discharge: 2015-01-11 | Disposition: A | Discharge status: Home / Self Care | Payer: No Typology Code available for payment source | Source: Ambulatory Visit | Attending: Obstetrics and Gynecology | Admitting: Obstetrics and Gynecology

## 2015-01-11 ENCOUNTER — Ambulatory Visit (HOSPITAL_COMMUNITY)
Admission: RE | Admit: 2015-01-11 | Discharge: 2015-01-11 | Disposition: A | Discharge status: Home / Self Care | Payer: Self-pay | Source: Ambulatory Visit | Attending: Obstetrics and Gynecology | Admitting: Obstetrics and Gynecology

## 2015-01-11 ENCOUNTER — Other Ambulatory Visit (HOSPITAL_COMMUNITY): Payer: Self-pay | Admitting: *Deleted

## 2015-01-11 VITALS — BP 110/72 | Temp 98.2°F | Ht 62.0 in | Wt 147.8 lb

## 2015-01-11 DIAGNOSIS — Z1231 Encounter for screening mammogram for malignant neoplasm of breast: Secondary | ICD-10-CM

## 2015-01-11 DIAGNOSIS — N6320 Unspecified lump in the left breast, unspecified quadrant: Secondary | ICD-10-CM

## 2015-01-11 DIAGNOSIS — N6321 Unspecified lump in the left breast, upper outer quadrant: Secondary | ICD-10-CM

## 2015-01-11 DIAGNOSIS — Z1239 Encounter for other screening for malignant neoplasm of breast: Secondary | ICD-10-CM

## 2015-01-11 NOTE — Addendum Note (Signed)
Encounter addended by: Loletta Parish, RN on: 01/11/2015  1:07 PM<BR>     Documentation filed: Notes Section

## 2015-01-11 NOTE — Patient Instructions (Signed)
Educational materials given on self-breast awareness. Explained to Aimee Figueroa that she did not need a Pap smear today due to last Pap smear was in 2014 per patient. Let her know [facility] will cover Pap smears every 3 years unless has a history of abnormal Pap smears. Let patient know she will be due for a Pap smear next year and that we will get result of last Pap smear to confirm. Told patient if she didn't get a Pap smear in 2014 that she can have one completed in [place]. Informed patient that we will call her if she needs a Pap smear prior to next year. Referred patient to the [facility] for diagnostic mammogram. Appointment scheduled for Thursday, January 11, 2015 at 1410. Patient aware of appointment and will be there. Aimee Figueroa verbalized understanding.  Vinton Layson, Arvil Chaco, RN 11:56 AM

## 2015-01-11 NOTE — Progress Notes (Addendum)
Complaints of bilateral yellowish colored nipple discharge when expresses since 2009.  Pap Smear:  Pap smear not completed today. Last Pap smear was in 2014 at [facility] and normal per patient. Per patient has a history of an abnormal Pap smear in 2001 that cryotherapy was completed for follow-up. Per patient has had at least three normal Pap smears since cryotherapy. Pap smear result 09/17/2011 is in EPIC.   Physical exam: Breasts Breasts symmetrical. No skin abnormalities bilateral breasts. Bilateral nipple retraction observed that per patient has always been that way. No nipple discharge bilateral breasts when expressed on exam. No lymphadenopathy. No lumps palpated right breast. Palpated a pea sized lump within the left breast at 2 o'clock 6 cm from the nipple. No complaints of pain or tenderness on exam. Referred patient to the [facility] for diagnostic mammogram. Appointment scheduled for Thursday, January 11, 2015 at 1410.   Pelvic/Bimanual No Pap smear completed today since last Pap smear was in 2014 per patient. Pap smear not indicated per [facility]CP guidelines.   Used interpreter Benjamine Sprague.

## 2015-01-15 ENCOUNTER — Ambulatory Visit: Payer: Self-pay | Admitting: Internal Medicine

## 2015-01-23 ENCOUNTER — Encounter: Payer: Self-pay | Admitting: Internal Medicine

## 2015-01-23 ENCOUNTER — Ambulatory Visit: Payer: Self-pay | Attending: Internal Medicine | Admitting: Internal Medicine

## 2015-01-23 VITALS — BP 103/66 | HR 76 | Temp 98.0°F | Resp 16 | Wt 152.4 lb

## 2015-01-23 DIAGNOSIS — E559 Vitamin D deficiency, unspecified: Secondary | ICD-10-CM | POA: Insufficient documentation

## 2015-01-23 DIAGNOSIS — Z791 Long term (current) use of non-steroidal anti-inflammatories (NSAID): Secondary | ICD-10-CM | POA: Insufficient documentation

## 2015-01-23 DIAGNOSIS — R109 Unspecified abdominal pain: Secondary | ICD-10-CM | POA: Insufficient documentation

## 2015-01-23 DIAGNOSIS — F329 Major depressive disorder, single episode, unspecified: Secondary | ICD-10-CM | POA: Insufficient documentation

## 2015-01-23 LAB — POCT URINALYSIS DIPSTICK
Bilirubin, UA: NEGATIVE
Glucose, UA: NEGATIVE
Ketones, UA: NEGATIVE
Leukocytes, UA: NEGATIVE
Nitrite, UA: NEGATIVE
PH UA: 5.5
Protein, UA: NEGATIVE
RBC UA: NEGATIVE
Spec Grav, UA: <=1.005
Urobilinogen, UA: 0.2

## 2015-01-23 NOTE — Progress Notes (Signed)
MRN: [id number] Name: Windie Marasco  Sex: female Age: 44 y.o. DOB: Jul 15, 1971  Allergies: Tylenol  Chief Complaint  Patient presents with  . Flank Pain    HPI: Patient is 44 y.o. female who comes today reported to have left-sided flank/back pain which she experienced 2 weeks ago as per patient she took meloxicam which helped her with the symptoms, again she developed similar symptom last week which has already been resolved, she denies any urinary symptoms but wanted to be checked if she has any urinary infection. Today her UA is negative for leukocytes or nitrites.  Past Medical History  Diagnosis Date  . Abnormal Pap smear   . Depression   . Tuberculosis     44 yrs old    Past Surgical History  Procedure Laterality Date  . Cesarean section        Medication List       This list is accurate as of: 01/23/15  4:57 PM.  Always use your most recent med list.               cetirizine 10 MG tablet  Commonly known as:  ZYRTEC  Take 10 mg by mouth daily.     citalopram 10 MG tablet  Commonly known as:  CELEXA  Take 1 tablet (10 mg total) by mouth daily.     hydrocortisone cream 1 %  Apply 1 application topically 2 (two) times daily.     hydrOXYzine 25 MG capsule  Commonly known as:  VISTARIL  Take one or 2 at bedtime for tension in jaw     ibuprofen 800 MG tablet  Commonly known as:  ADVIL,MOTRIN  Take 1 tablet (800 mg total) by mouth every 8 (eight) hours as needed.     ketoconazole 2 % cream  Commonly known as:  NIZORAL  Apply 1 application topically daily.     LORazepam 0.5 MG tablet  Commonly known as:  ATIVAN  Take one twice daily as needed to calm burning irritation of skin on neck and head     meloxicam 7.5 MG tablet  Commonly known as:  MOBIC  Take one daily for jaw pain     nortriptyline 25 MG capsule  Commonly known as:  PAMELOR  Take 25 mg by mouth at bedtime. Take two to three tablets at QHS     omega-3 acid ethyl esters 1 G  capsule  Commonly known as:  LOVAZA  Take by mouth 2 (two) times daily.     triamcinolone cream 0.1 %  Commonly known as:  KENALOG  Apply 1 application topically 2 (two) times daily.     Vitamin D (Ergocalciferol) 50000 UNITS Caps capsule  Commonly known as:  DRISDOL  Take 1 capsule (50,000 Units total) by mouth every 7 (seven) days.        No orders of the defined types were placed in this encounter.    Immunization History  Administered Date(s) Administered  . Influenza,inj,Quad PF,36+ Mos 09/06/2014  . Tdap 01/02/2012    Family History  Problem Relation Age of Onset  . Diabetes Father   . Anesthesia problems Neg Hx     History  Substance Use Topics  . Smoking status: Never Smoker   . Smokeless tobacco: Never Used  . Alcohol Use: No    Review of Systems   As noted in HPI  Filed Vitals:   01/23/15 1458  BP: 103/66  Pulse: 76  Temp: 98 F (36.7 C)  Resp: 16    Physical Exam  Physical Exam  Constitutional: No distress.  Eyes: EOM are normal. Pupils are equal, round, and reactive to light.  Cardiovascular: Normal rate and regular rhythm.   Pulmonary/Chest: Breath sounds normal. No respiratory distress. She has no wheezes. She has no rales.  Abdominal: She exhibits no distension. There is no tenderness. There is no rebound.  No CVA tenderness  Musculoskeletal: She exhibits no edema.    CBC    Component Value Date/Time   WBC 4.1* 12/14/2013 0925   WBC 8.5 03/12/2012 0540   RBC 4.45 12/14/2013 0925   RBC 3.28* 03/12/2012 0540   HGB 13.6 12/14/2013 0925   HGB 10.3* 03/12/2012 0540   HCT 42.3 12/14/2013 0925   HCT 31.3* 03/12/2012 0540   PLT 113* 03/12/2012 0540   MCV 95.1 12/14/2013 0925   MCV 95.4 03/12/2012 0540   LYMPHSABS 1.5 09/10/2011 1035   MONOABS 0.2 09/10/2011 1035   EOSABS 0.0 09/10/2011 1035   BASOSABS 0.0 09/10/2011 1035    CMP     Component Value Date/Time   NA 138 09/06/2014 1551   K 3.9 09/06/2014 1551   CL 103  09/06/2014 1551   CO2 26 09/06/2014 1551   GLUCOSE 88 09/06/2014 1551   BUN 8 09/06/2014 1551   CREATININE 0.51 09/06/2014 1551   CALCIUM 8.9 09/06/2014 1551   PROT 7.0 09/06/2014 1551   ALBUMIN 4.1 09/06/2014 1551   AST 29 09/06/2014 1551   ALT 41* 09/06/2014 1551   ALKPHOS 53 09/06/2014 1551   BILITOT 0.4 09/06/2014 1551   GFRNONAA >89 09/06/2014 1551   GFRAA >89 09/06/2014 1551    Lab Results  Component Value Date/Time   CHOL 148 12/14/2013 09:24 AM    Lab Results  Component Value Date/Time   HGBA1C 5.2 02/01/2014 05:19 PM    Lab Results  Component Value Date/Time   AST 29 09/06/2014 03:51 PM    Assessment and Plan  Flank pain - Plan:  Results for orders placed or performed in visit on 01/23/15  Urinalysis Dipstick  Result Value Ref Range   Color, UA yellow    Clarity, UA clear    Glucose, UA neg    Bilirubin, UA neg    Ketones, UA neg    Spec Grav, UA <=1.005    Blood, UA neg    pH, UA 5.5    Protein, UA neg    Urobilinogen, UA 0.2    Nitrite, UA neg    Leukocytes, UA Negative    Urinalysis DipstickIs negative for infection, her pain is also improved .  Vitamin D deficiency Patient is taking vitamin D once a week dosage.   Return in about 3 months (around 04/24/2015), or if symptoms worsen or fail to improve.   This note has been created with Surveyor, quantity. Any transcriptional errors are unintentional.    Lorayne Marek, MD

## 2015-01-23 NOTE — Progress Notes (Signed)
Patient states about two weeks ago was having left sided flank pain  That lasted for two days Pain went away but patient was  Concerned  And wanted it checked out

## 2015-05-31 ENCOUNTER — Ambulatory Visit (INDEPENDENT_AMBULATORY_CARE_PROVIDER_SITE_OTHER): Payer: Self-pay | Admitting: Physician Assistant

## 2015-05-31 VITALS — BP 110/72 | HR 70 | Temp 98.0°F | Resp 18 | Ht 62.0 in | Wt 149.0 lb

## 2015-05-31 DIAGNOSIS — Z0000 Encounter for general adult medical examination without abnormal findings: Secondary | ICD-10-CM

## 2015-05-31 DIAGNOSIS — Z139 Encounter for screening, unspecified: Secondary | ICD-10-CM

## 2015-05-31 DIAGNOSIS — F329 Major depressive disorder, single episode, unspecified: Secondary | ICD-10-CM

## 2015-05-31 DIAGNOSIS — F32A Depression, unspecified: Secondary | ICD-10-CM

## 2015-05-31 DIAGNOSIS — M2550 Pain in unspecified joint: Secondary | ICD-10-CM

## 2015-05-31 LAB — POCT URINALYSIS DIPSTICK
Bilirubin, UA: NEGATIVE
Blood, UA: NEGATIVE
Glucose, UA: NEGATIVE
KETONES UA: NEGATIVE
Leukocytes, UA: NEGATIVE
Nitrite, UA: NEGATIVE
Protein, UA: NEGATIVE
SPEC GRAV UA: 1.01
Urobilinogen, UA: 0.2
pH, UA: 6.5

## 2015-05-31 LAB — HEMOGLOBIN A1C
Hgb A1c MFr Bld: 5.8 % — ABNORMAL HIGH (ref <5.7)
MEAN PLASMA GLUCOSE: 120 mg/dL — AB (ref <117)

## 2015-05-31 LAB — CBC WITH DIFFERENTIAL/PLATELET
BASOS ABS: 0 10*3/uL (ref 0.0–0.1)
BASOS PCT: 1 % (ref 0–1)
EOS PCT: 1 % (ref 0–5)
Eosinophils Absolute: 0 10*3/uL (ref 0.0–0.7)
HEMATOCRIT: 37 % (ref 36.0–46.0)
Hemoglobin: 12.1 g/dL (ref 12.0–15.0)
Lymphocytes Relative: 45 % (ref 12–46)
Lymphs Abs: 1.8 10*3/uL (ref 0.7–4.0)
MCH: 27.9 pg (ref 26.0–34.0)
MCHC: 32.7 g/dL (ref 30.0–36.0)
MCV: 85.5 fL (ref 78.0–100.0)
MPV: 11.4 fL (ref 8.6–12.4)
Monocytes Absolute: 0.3 10*3/uL (ref 0.1–1.0)
Monocytes Relative: 7 % (ref 3–12)
NEUTROS ABS: 1.9 10*3/uL (ref 1.7–7.7)
Neutrophils Relative %: 46 % (ref 43–77)
PLATELETS: 201 10*3/uL (ref 150–400)
RBC: 4.33 MIL/uL (ref 3.87–5.11)
RDW: 15.1 % (ref 11.5–15.5)
WBC: 4.1 10*3/uL (ref 4.0–10.5)

## 2015-05-31 LAB — C-REACTIVE PROTEIN: CRP: <0.5 mg/dL (ref <0.60)

## 2015-05-31 LAB — LIPID PANEL
CHOLESTEROL: 173 mg/dL (ref 125–200)
HDL: 34 mg/dL — AB (ref >=46)
TRIGLYCERIDES: 538 mg/dL — AB (ref <150)
Total CHOL/HDL Ratio: 5.1 Ratio — ABNORMAL HIGH (ref <=5.0)

## 2015-05-31 LAB — COMPREHENSIVE METABOLIC PANEL
ALK PHOS: 50 U/L (ref 33–115)
ALT: 66 U/L — AB (ref 6–29)
AST: 47 U/L — ABNORMAL HIGH (ref 10–30)
Albumin: 4.4 g/dL (ref 3.6–5.1)
BUN: 8 mg/dL (ref 7–25)
CHLORIDE: 101 mmol/L (ref 98–110)
CO2: 27 mmol/L (ref 20–31)
CREATININE: 0.51 mg/dL (ref 0.50–1.10)
Calcium: 9.1 mg/dL (ref 8.6–10.2)
Glucose, Bld: 89 mg/dL (ref 65–99)
Potassium: 3.6 mmol/L (ref 3.5–5.3)
Sodium: 138 mmol/L (ref 135–146)
TOTAL PROTEIN: 7.6 g/dL (ref 6.1–8.1)
Total Bilirubin: 0.8 mg/dL (ref 0.2–1.2)

## 2015-05-31 LAB — TSH: TSH: 0.883 u[IU]/mL (ref 0.350–4.500)

## 2015-05-31 MED ORDER — IBUPROFEN 800 MG PO TABS: 800.0000 mg | ORAL_TABLET | Freq: Three times a day (TID) | ORAL | Status: DC | PRN

## 2015-05-31 MED ORDER — FLUOXETINE HCL 20 MG PO CAPS: 20.0000 mg | ORAL_CAPSULE | ORAL | Status: DC

## 2015-05-31 NOTE — Addendum Note (Signed)
Addended by: Roselee Culver on: 05/31/2015 04:58 PM   Modules accepted: Miquel Dunn

## 2015-05-31 NOTE — Addendum Note (Signed)
Addended by: Tereasa Coop on: 05/31/2015 04:14 PM   Modules accepted: Miquel Dunn

## 2015-05-31 NOTE — Progress Notes (Signed)
  Medical screening examination/treatment/procedure(s) were performed by non-physician practitioner and as supervising physician I was immediately available for consultation/collaboration.     

## 2015-05-31 NOTE — Progress Notes (Signed)
05/31/2015 at 4:05 PM  Aimee Figueroa / DOB: 02-16-1971 / MRN: [id number]  The patient has AMA (advanced maternal age) multigravida 84+; History of cesarean section; Cephalalgia; Skin depigmentation; Accessory skin tags; High triglycerides; Depression; and Abnormal LFTs on her problem list.  SUBJECTIVE  Aimee Figueroa is a 44 y.o. well appearing female presenting for the chief complaint of need for annual exam.  Her most recent mammogram was on 01/11/15 and this was followed by an ultrasound due to a palpable mass in the 2 o'clock position on the left breast. Ultrasound revealed no evidence of malignancy. She has no family history of breast cancer.  She has a follow up appointment scheduled through the [facility] hospital. Per [facility] her last pap was in 2012.  She thinks she may have received this from another facility since that time and would like to investigate before doing a pap today.  If this has not been done she will come back.  She tested negative for HIV and syphillis in 2013 and has no change in partners since that time.  She is a never smoker and drinks mild amount of alcohol on holidays only. She declines the flu shot today.   She complains of various aches and pains that have worsened in the last year and is concerned that something may be wrong with her.  She complains of low back pain, bilateral elbow pain, and knee pain that comes and goes.  She has been evaluated for this before and reports "they can't find anything wrong with me." She has been prescribed NSAID's but she does not like to take these.  She would like to further investigate this problem.  She denies fever, chills and photosensitive rash.  She reports a history of depression that worsened after her son was born 4-5 years ago. She feels like a bad mother because she was planning to aborting him but changed her mind later in the pregnancy.  She went on Nortriptyline after the child was born and this helped her symptoms  immensely. It is not clear why she stopped this therapy.  She denies dysthymic mood today however she reports anhedonia. She has extreme guilt regarding her consideration of abortion and feels that she is a bad mother.          She  has a past medical history of Abnormal Pap smear; Depression; and Tuberculosis.    Medications reviewed and updated by myself where necessary, and exist elsewhere in the encounter.   Aimee Figueroa is allergic to tylenol. She  reports that she has never smoked. She has never used smokeless tobacco. She reports that she does not drink alcohol or use illicit drugs. She  reports that she currently engages in sexual activity. The patient  has past surgical history that includes Cesarean section.  Her family history includes Diabetes in her father; Hyperlipidemia in her father and mother. There is no history of Anesthesia problems.  Review of Systems  Constitutional: Negative for fever and chills.  Respiratory: Negative for shortness of breath.   Cardiovascular: Negative for chest pain.  Gastrointestinal: Negative for nausea and abdominal pain.  Genitourinary: Negative.   Musculoskeletal: Positive for joint pain.  Skin: Negative for rash.  Neurological: Negative for dizziness and headaches.    OBJECTIVE  Her  height is 5\' 2"  (1.575 m) and weight is 149 lb (67.586 kg). Her oral temperature is 98 F (36.7 C). Her blood pressure is 110/72 and her pulse is 70. Her respiration is 18 and  oxygen saturation is 99%.  The patient's body mass index is 27.25 kg/(m^2).  Physical Exam  Constitutional: She is oriented to person, place, and time. She appears well-developed and well-nourished. No distress.  Eyes: Conjunctivae and EOM are normal. Pupils are equal, round, and reactive to light.  Neck: No thyromegaly present.  Cardiovascular: Normal rate.   Respiratory: Effort normal.  GI: She exhibits no distension.  Musculoskeletal: Normal range of motion.  Neurological: She  is alert and oriented to person, place, and time. She has normal reflexes.  Skin: Skin is warm and dry. She is not diaphoretic.    Results for orders placed or performed in visit on 05/31/15 (from the past 24 hour(s))  POCT urinalysis dipstick     Status: None   Collection Time: 05/31/15 11:54 AM  Result Value Ref Range   Color, UA lt yellow    Clarity, UA clear    Glucose, UA neg    Bilirubin, UA neg    Ketones, UA neg    Spec Grav, UA 1.010    Blood, UA neg    pH, UA 6.5    Protein, UA neg    Urobilinogen, UA 0.2    Nitrite, UA neg    Leukocytes, UA Negative Negative    ASSESSMENT & PLAN  Aimee Figueroa was seen today for annual exam, neck pain and back pain.  Diagnoses and all orders for this visit:  Annual physical exam  Screening -     TSH -     POCT urinalysis dipstick -     Comprehensive metabolic panel -     CBC with Differential/Platelet -     Lipid panel -     Hemoglobin A1c -     Vitamin D, 25-hydroxy  Joint pain -     ibuprofen (ADVIL,MOTRIN) 800 MG tablet; Take 1 tablet (800 mg total) by mouth every 8 (eight) hours as needed for headache or moderate pain. -     Sedimentation Rate -     C-reactive protein  Depression (emotion): Counseling offered and patient is concerned with cost.  She is amenable SSRI therpay. While I would prefer she follow up sooner for this problem, due to financial constraints she is willing to follow up in 6 months.   -     FLUoxetine (PROZAC) 20 MG capsule; Take 1 capsule (20 mg total) by mouth every morning.    The patient was advised to call or come back to clinic if she does not see an improvement in symptoms, or worsens with the above plan.   Aimee Figueroa, MHS, PA-C [facility] 05/31/2015 4:05 PM

## 2015-06-01 LAB — SEDIMENTATION RATE: Sed Rate: 21 mm/hr — ABNORMAL HIGH (ref 0–20)

## 2015-06-01 LAB — VITAMIN D 25 HYDROXY (VIT D DEFICIENCY, FRACTURES): Vit D, 25-Hydroxy: 28 ng/mL — ABNORMAL LOW (ref 30–100)

## 2015-06-11 ENCOUNTER — Telehealth: Payer: Self-pay

## 2015-06-11 NOTE — Telephone Encounter (Signed)
Pt calling about labs. Please review.  

## 2015-06-12 NOTE — Telephone Encounter (Signed)
Patient with unremarkable labs.  No need for treatment other than what was discussed in the office.  Philis Fendt, MS, PA-C   6:11 PM, 06/12/2015

## 2015-06-15 ENCOUNTER — Telehealth: Payer: Self-pay

## 2015-06-15 NOTE — Telephone Encounter (Signed)
Pt called about lab results.  Please advise [phone]

## 2015-06-16 ENCOUNTER — Telehealth: Payer: Self-pay

## 2015-06-16 NOTE — Telephone Encounter (Signed)
Returned pt clld- Advised of lab results and provider's notation. Pt requested copy of lab results. Advised pt I would put a copy of her lab results in the mail today. Pt stated she understood results.

## 2015-06-18 ENCOUNTER — Encounter: Payer: Self-pay | Admitting: Family Medicine

## 2015-06-18 NOTE — Telephone Encounter (Signed)
Spoke with patient gave her results of lab and sent copy in mail

## 2016-02-06 ENCOUNTER — Encounter: Payer: Self-pay | Admitting: Pediatrics

## 2016-06-03 ENCOUNTER — Encounter: Payer: Self-pay | Admitting: Pediatrics

## 2016-06-09 ENCOUNTER — Other Ambulatory Visit (HOSPITAL_COMMUNITY): Payer: Self-pay | Admitting: *Deleted

## 2016-06-09 DIAGNOSIS — N6459 Other signs and symptoms in breast: Secondary | ICD-10-CM

## 2016-06-09 DIAGNOSIS — N644 Mastodynia: Secondary | ICD-10-CM

## 2016-06-13 ENCOUNTER — Ambulatory Visit
Admission: RE | Admit: 2016-06-13 | Discharge: 2016-06-13 | Disposition: A | Discharge status: Home / Self Care | Payer: No Typology Code available for payment source | Source: Ambulatory Visit | Attending: Obstetrics and Gynecology | Admitting: Obstetrics and Gynecology

## 2016-06-13 ENCOUNTER — Ambulatory Visit (HOSPITAL_COMMUNITY)
Admission: RE | Admit: 2016-06-13 | Discharge: 2016-06-13 | Disposition: A | Discharge status: Home / Self Care | Payer: Self-pay | Source: Ambulatory Visit | Attending: Obstetrics and Gynecology | Admitting: Obstetrics and Gynecology

## 2016-06-13 ENCOUNTER — Encounter (HOSPITAL_COMMUNITY): Payer: Self-pay

## 2016-06-13 VITALS — BP 118/70 | Temp 98.7°F | Ht 62.0 in | Wt 149.8 lb

## 2016-06-13 DIAGNOSIS — N644 Mastodynia: Secondary | ICD-10-CM

## 2016-06-13 DIAGNOSIS — Z1239 Encounter for other screening for malignant neoplasm of breast: Secondary | ICD-10-CM

## 2016-06-13 DIAGNOSIS — N6459 Other signs and symptoms in breast: Secondary | ICD-10-CM

## 2016-06-13 HISTORY — DX: Hyperlipidemia, unspecified: E78.5

## 2016-06-13 NOTE — Patient Instructions (Signed)
Explained breast self awareness to [facility]. Patient did not need a Pap smear today due to last Pap smear was 04/29/2016. Let her know [facility] will cover Pap smears every 3 years unless has a history of abnormal Pap smears.  Referred patient to the [facility] for diagnostic mammogram. Appointment scheduled for Friday, June 13, 2016 at 1440. Tashala Sosa-Gonzalez verbalized understanding.  Allice Garro, Arvil Chaco, RN 2:36 PM

## 2016-06-13 NOTE — Progress Notes (Signed)
Complaints of left outer breast pain x 4-5 months that comes and goes. Patient rates pain at a 3-4 out of 10.  Pap Smear: Pap smear not completed today. Last Pap smear was 04/29/2016 at the [facility] and normal. Per patient has a history of an abnormal Pap smear in 2001 that cryotherapy was completed for follow up. Per patient has had more than three normal Pap smears since cryotherapy was completed in 2001. Last Pap smear result is in EPIC.  Physical exam: Breasts Breasts symmetrical. No skin abnormalities bilateral breasts. Bilateral nipple retraction observed that per patient has always been that way. No nipple discharge bilateral breasts. No lymphadenopathy. No lumps palpated bilateral breasts. Complaints of left outer breast pain between 12 o'clock and 3 o'clock on exam. Complained of right outer breast tenderness on exam. Per patient that was the first time the right outer breast had been tender. Referred patient to the [place] [facility] for diagnostic mammogram. Appointment scheduled for Friday, June 13, 2016 at 1440.  Pelvic/Bimanual No Pap smear completed today since last Pap smear was 04/29/2016. Pap smear not indicated per B[facility]CP guidelines.   Smoking History: Patient has never smoked.  Patient Navigation: Patient education provided. Access to services provided for patient through [facility] program.

## 2016-06-17 ENCOUNTER — Encounter (HOSPITAL_COMMUNITY): Payer: Self-pay | Admitting: *Deleted

## 2016-12-05 ENCOUNTER — Ambulatory Visit: Payer: Self-pay

## 2016-12-06 ENCOUNTER — Ambulatory Visit (INDEPENDENT_AMBULATORY_CARE_PROVIDER_SITE_OTHER): Payer: Self-pay | Admitting: Family Medicine

## 2016-12-06 VITALS — BP 112/68 | HR 85 | Temp 99.0°F | Resp 20 | Ht 61.5 in | Wt 152.1 lb

## 2016-12-06 DIAGNOSIS — F321 Major depressive disorder, single episode, moderate: Secondary | ICD-10-CM

## 2016-12-06 DIAGNOSIS — G44209 Tension-type headache, unspecified, not intractable: Secondary | ICD-10-CM

## 2016-12-06 MED ORDER — NAPROXEN 500 MG PO TABS: 500.0000 mg | ORAL_TABLET | Freq: Two times a day (BID) | ORAL | 1 refills | Status: DC

## 2016-12-06 NOTE — Patient Instructions (Addendum)
IF you received an x-ray today, you will receive an invoice from [facility]. Please contact [facility] at [phone] with questions or concerns regarding your invoice.   IF you received labwork today, you will receive an invoice from [facility]. Please contact [facility] at [phone] with questions or concerns regarding your invoice.   Our billing staff will not be able to assist you with questions regarding bills from these companies.  You will be contacted with the lab results as soon as they are available. The fastest way to get your results is to activate your My Chart account. Instructions are located on the last page of this paperwork. If you have not heard from [place] regarding the results in 2 weeks, please contact this office.     Tension Headache A tension headache is a feeling of pain, pressure, or aching that is often felt over the front and sides of the head. The pain can be dull, or it can feel tight (constricting). Tension headaches are not normally associated with nausea or vomiting, and they do not get worse with physical activity. Tension headaches can last from 30 minutes to several days. This is the most common type of headache. CAUSES The exact cause of this condition is not known. Tension headaches often begin after stress, anxiety, or depression. Other triggers may include:  Alcohol.  Too much caffeine, or caffeine withdrawal.  Respiratory infections, such as colds, flu, or sinus infections.  Dental problems or teeth clenching.  Fatigue.  Holding your head and neck in the same position for a long period of time, such as while using a computer.  Smoking. SYMPTOMS Symptoms of this condition include:  A feeling of pressure around the head.  Dull, aching head pain.  Pain felt over the front and sides of the head.  Tenderness in the muscles of the head, neck, and shoulders. DIAGNOSIS This condition may be diagnosed based on your  symptoms and a physical exam. Tests may be done, such as a CT scan or an MRI of your head. These tests may be done if your symptoms are severe or unusual. TREATMENT This condition may be treated with lifestyle changes and medicines to help relieve symptoms. HOME CARE INSTRUCTIONS Managing Pain   Take over-the-counter and prescription medicines only as told by your health care provider.  Lie down in a dark, quiet room when you have a headache.  If directed, apply ice to the head and neck area:  Put ice in a plastic bag.  Place a towel between your skin and the bag.  Leave the ice on for 20 minutes, 2-3 times per day.  Use a heating pad or a hot shower to apply heat to the head and neck area as told by your health care provider. Eating and Drinking   Eat meals on a regular schedule.  Limit alcohol use.  Decrease your caffeine intake, or stop using caffeine. General Instructions   Keep all follow-up visits as told by your health care provider. This is important.  Keep a headache journal to help find out what may trigger your headaches. For example, write down:  What you eat and drink.  How much sleep you get.  Any change to your diet or medicines.  Try massage or other relaxation techniques.  Limit stress.  Sit up straight, and avoid tensing your muscles.  Do not use tobacco products, including cigarettes, chewing tobacco, or e-cigarettes. If you need help quitting, ask your health care provider.  Exercise regularly as told by your health care provider.  Get 7-9 hours of sleep, or the amount recommended by your health care provider. SEEK MEDICAL CARE IF:  Your symptoms are not helped by medicine.  You have a headache that is different from what you normally experience.  You have nausea or you vomit.  You have a fever. SEEK IMMEDIATE MEDICAL CARE IF:  Your headache becomes severe.  You have repeated vomiting.  You have a stiff neck.  You have a loss of  vision.  You have problems with speech.  You have pain in your eye or ear.  You have muscular weakness or loss of muscle control.  You lose your balance or you have trouble walking.  You feel faint or you pass out.  You have confusion. This information is not intended to replace advice given to you by your health care provider. Make sure you discuss any questions you have with your health care provider. Document Released: 09/15/2005 Document Revised: 06/06/2015 Document Reviewed: 01/08/2015 Elsevier Interactive Patient Education  2017 [employer] tensional (Tension Headache) Una cefalea tensional es una sensacin de dolor o presin que suele manifestarse en la frente y los lados de la cabeza. Este es el tipo ms comn de dolor de [place]. El dolor puede ser sordo o puede sentirse que comprime (constrictivo). Generalmente, no se asocia con nuseas o vmitos y no empeora con la actividad fsica. Las cefaleas tensionales pueden durar de 30 minutos a varios das. CAUSAS Se desconoce la causa exacta de esta afeccin. Suelen comenzar despus de una situacin de estrs, ansiedad o por depresin. Otros factores desencadenantes pueden ser los siguientes:  Alcohol.  Demasiada cafena o abstinencia de cafena.  Infecciones respiratorias, como resfriados, gripes o sinusitis.  Problemas dentales o apretar los dientes.  Fatiga.  Mantener la cabeza y el cuello en la misma posicin durante un perodo prolongado, por ejemplo, al usar la computadora.  Fumar. SNTOMAS Los sntomas de esta afeccin incluyen lo siguiente:  Sensacin de presin alrededor de la cabeza.  Dolor "sordo" en la cabeza.  Dolor que siente sobre la frente y los lados de la cabeza.  Dolor a la [employer] de la cabeza, del cuello y de los hombros. DIAGNSTICO Esta afeccin se puede diagnosticar en funcin de los sntomas y de un examen fsico. Pueden hacerle estudios, como una tomografa  computarizada o una resonancia magntica de la cabeza. Estos estudios se indican si los sntomas son graves o fuera de lo comn. TRATAMIENTO Esta afeccin puede tratarse con cambios en el estilo de vida y medicamentos que lo ayuden a Public house manager los sntomas. [place] Control del [employer] medicamentos de venta libre y los recetados solamente como se lo haya indicado el mdico.  Cuando sienta dolor de cabeza acustese en un cuarto oscuro y tranquilo.  Si se lo indican, aplique hielo sobre la cabeza y la zona del cuello:  Ponga el hielo en una bolsa plstica.  Coloque una toalla entre la piel y la bolsa de hielo.  Coloque el hielo durante 87minutos, 2 a 3veces por Training and development officer.  Utilice una almohadilla trmica o tome una ducha con agua caliente para aplicar calor en la cabeza y la zona del cuello como se lo haya indicado el mdico. Comida y bebida   Mantenga un horario para las comidas.  Limite el consumo de bebidas alcohlicas.  Disminuya el consumo de cafena o deje de consumir cafena.  Instrucciones generales   Concurra a todas las visitas de control como se lo haya indicado el mdico. Esto es importante.  Lleve un diario de los dolores de cabeza para Neurosurgeon qu factores pueden desencadenarlos. Por ejemplo, escriba los siguientes datos:  Lo que usted come y Buyer, retail.  Cunto tiempo duerme.  Algn cambio en su dieta o en los medicamentos.  Pruebe algunas tcnicas de relajacin, como los [place].  Limite el estrs.  Sintese derecho y [employer].  No consuma productos que contengan tabaco, incluidos cigarrillos, tabaco de Higher education careers adviser o cigarrillos electrnicos. Si necesita ayuda para dejar de fumar, consulte al [employer].  Haga actividad fsica habitualmente como se lo haya indicado el mdico.  Duerma entre 7 y 9horas o la cantidad de horas que le haya recomendado el mdico. SOLICITE ATENCIN MDICA SI:  Los medicamentos no  Dealer los sntomas.  Tiene un dolor de cabeza que es diferente del dolor de cabeza habitual.  Tiene nuseas o vmitos.  Tiene fiebre. SOLICITE ATENCIN MDICA DE INMEDIATO SI:  El dolor se hace cada vez ms intenso.  Ha vomitado repetidas veces.  Presenta rigidez en el cuello.  Sufre prdida de la visin.  Tiene problemas para hablar.  Siente dolor en el ojo o en el odo.  Presenta debilidad muscular o prdida del control muscular.  Pierde el equilibrio o tiene problemas para Writer.  Sufre mareos o se desmaya.  Se siente confundido. Esta informacin no tiene Marine scientist el consejo del mdico. Asegrese de hacerle al mdico cualquier pregunta que tenga. Document Released: 06/25/2005 Document Revised: 06/06/2015 Elsevier Interactive Patient Education  2017 [employer].

## 2016-12-06 NOTE — Progress Notes (Signed)
Chief Complaint  Patient presents with  . Headache  . Dizziness    HPI   Pt reports that she has been having headaches a month ago that was during her period lasting for some hours but seems to be worse at night. She thought it might be related to menopausal changes She did not get an evaluation at that time The character of the headache is like a squeezing pain at the temples.  She feels better when she applies pressure to the scalp.    She reports that 13 a days ago the pain has returned She has headache at the temples, top of the head and also back of the head near the neck. She reports that when she looks down she feels dizzy. She reports that once she looks up again she has normal vision.  She does not wear glasses.  This also occurs when she writes.   She drinks water, she does not drink coffee often and has not recently quit caffeine  Patient's last menstrual period was 11/28/2016.  In her family there is diabetes in her father and mother She has hypertension in her father Family History  Problem Relation Age of Onset  . Diabetes Father   . Hyperlipidemia Father   . Hyperlipidemia Mother   . Anesthesia problems Neg Hx     She takes nortriptyline at bedtime for depression She reports that she she had body aches and pains and was told by a doctor that she has fibromyalgia  She reports that she has been taking them for a year but it has not helped with this headache.   Past Medical History:  Diagnosis Date  . Abnormal Pap smear   . Depression   . Hyperlipemia   . 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 Calcium-Magnesium-Vitamin D (CALCIUM 500 PO) Take by mouth.    . cetirizine (ZYRTEC) 10 MG tablet Take 10 mg by mouth daily.    . nortriptyline (PAMELOR) 25 MG capsule Take 25 mg by mouth at bedtime.    Marland Kitchen omega-3 acid ethyl esters (LOVAZA) 1 G capsule Take by mouth 2 (two) times daily.    . naproxen  (NAPROSYN) 500 MG tablet Take 1 tablet (500 mg total) by mouth 2 (two) times daily with a meal. 60 tablet 1   No current facility-administered medications for this visit.     Allergies:  Allergies  Allergen Reactions  . Tylenol [Acetaminophen] Rash    Past Surgical History:  Procedure Laterality Date  . CESAREAN SECTION      Social History   Social History  . Marital status: Single    Spouse name: N/A  . Number of children: N/A  . Years of education: N/A   Social History Main Topics  . Smoking status: Never Smoker  . Smokeless tobacco: Never Used  . Alcohol use No  . Drug use: No  . Sexual activity: Yes    Birth control/ protection: Pill   Other Topics Concern  . None   Social History Narrative  . None    ROS See hpi  Objective: Vitals:   12/06/16 0853  BP: 112/68  Pulse: 85  Resp: 20  Temp: 99 F (37.2 C)  TempSrc: Oral  SpO2: 98%  Weight: 152 lb 2 oz (69 kg)  Height: 5' 1.5" (1.562 m)    Physical Exam  Constitutional: She is oriented to person, place, and time. She appears  well-developed and well-nourished.  HENT:  Head: Normocephalic and atraumatic.  Right Ear: External ear normal.  Left Ear: External ear normal.  Mouth/Throat: Oropharynx is clear and moist.  Eyes: Conjunctivae and EOM are normal.  Neck: Normal range of motion. No thyromegaly present.  Cardiovascular: Normal rate, regular rhythm and normal heart sounds.   No murmur heard. Pulmonary/Chest: Effort normal and breath sounds normal. No respiratory distress. She has no wheezes. She has no rales. She exhibits no tenderness.  Musculoskeletal: Normal range of motion. She exhibits no edema.  Neurological: She is alert and oriented to person, place, and time. She has normal strength.  Reflex Scores:      Brachioradialis reflexes are 2+ on the right side and 2+ on the left side.      Patellar reflexes are 2+ on the right side and 2+ on the left side.    CRANIAL NERVES: CN II: Visual  fields are full to confrontation. Fundoscopic exam is normal with sharp discs and no vascular changes. Pupils are round equal and briskly reactive to light. CN III, IV, VI: extraocular movement are normal. No ptosis. CN V: Facial sensation is intact to pinprick in all 3 divisions bilaterally. Corneal responses are intact.  CN VII: Face is symmetric with normal eye closure and smile. CN VIII: Hearing is normal to rubbing fingers CN IX, X: Palate elevates symmetrically. Phonation is normal. CN XI: Head turning and shoulder shrug are intact CN XII: Tongue is midline with normal movements and no atrophy.  Body mass index is 28.28 kg/m.  Assessment and Plan Aimee Figueroa was seen today for headache and dizziness.  Diagnoses and all orders for this visit:  Acute non intractable tension-type headache- discussed that she should try naproxen bid She should keep a headache diary She currently has tension headaches and expressed that heat to the base of the neck can help with muscle fatigue Will check for underlying deficiencies -     CBC with Differential/Platelet -     Basic metabolic panel -     TSH -     VITAMIN D 25 Hydroxy (Vit-D Deficiency, Fractures)  Moderate major depression ([place])- continue nortriptyline for depression as instructed  Other orders -     naproxen (NAPROSYN) 500 MG tablet; Take 1 tablet (500 mg total) by mouth 2 (two) times daily with a meal.     Ranjit Ashurst A Deontez Klinke

## 2016-12-08 LAB — CBC WITH DIFFERENTIAL/PLATELET
BASOS ABS: 0 10*3/uL (ref 0.0–0.2)
Basos: 1 %
EOS (ABSOLUTE): 0.1 10*3/uL (ref 0.0–0.4)
EOS: 4 %
Hematocrit: 35.9 % (ref 34.0–46.6)
Hemoglobin: 11.7 g/dL (ref 11.1–15.9)
Immature Grans (Abs): 0 10*3/uL (ref 0.0–0.1)
Immature Granulocytes: 0 %
LYMPHS ABS: 1.3 10*3/uL (ref 0.7–3.1)
Lymphs: 41 %
MCH: 28.2 pg (ref 26.6–33.0)
MCHC: 32.6 g/dL (ref 31.5–35.7)
MCV: 87 fL (ref 79–97)
MONOS ABS: 0.2 10*3/uL (ref 0.1–0.9)
Monocytes: 5 %
NEUTROS PCT: 49 %
Neutrophils Absolute: 1.6 10*3/uL (ref 1.4–7.0)
PLATELETS: 199 10*3/uL (ref 150–379)
RBC: 4.15 x10E6/uL (ref 3.77–5.28)
RDW: 16.6 % — AB (ref 12.3–15.4)
WBC: 3.2 10*3/uL — AB (ref 3.4–10.8)

## 2016-12-08 LAB — BASIC METABOLIC PANEL
BUN/Creatinine Ratio: 18 (ref 9–23)
BUN: 8 mg/dL (ref 6–24)
CALCIUM: 8.8 mg/dL (ref 8.7–10.2)
CHLORIDE: 102 mmol/L (ref 96–106)
CO2: 25 mmol/L (ref 18–29)
Creatinine, Ser: 0.45 mg/dL — ABNORMAL LOW (ref 0.57–1.00)
GFR calc Af Amer: 141 mL/min/{1.73_m2} (ref >59)
GFR calc non Af Amer: 122 mL/min/{1.73_m2} (ref >59)
GLUCOSE: 105 mg/dL — AB (ref 65–99)
POTASSIUM: 4.3 mmol/L (ref 3.5–5.2)
SODIUM: 141 mmol/L (ref 134–144)

## 2016-12-08 LAB — VITAMIN D 25 HYDROXY (VIT D DEFICIENCY, FRACTURES): VIT D 25 HYDROXY: 20.7 ng/mL — AB (ref 30.0–100.0)

## 2016-12-08 LAB — TSH: TSH: 1.22 u[IU]/mL (ref 0.450–4.500)

## 2016-12-15 ENCOUNTER — Telehealth: Payer: Self-pay | Admitting: Family Medicine

## 2016-12-15 NOTE — Telephone Encounter (Signed)
PATIENT STATES SHE SAW DR. Nolon Rod A WEEK AGO FOR HEADACHES. DR. Nolon Rod DID SOME LAB WORK TO CHECK HER THYROID AND HER KIDNEYS. SHE IS GETTING VERY CONCERNED BECAUSE NO ONE HAS CALLED HER WITH THE RESULTS YET. BEST PHONE [phone] (CELL) PHARMACY CHOICE IS [facility] ON [address] [place]

## 2016-12-16 NOTE — Telephone Encounter (Signed)
Patient called back very upset and concern because she has not heard anything about her test results from 12/06/16, states she does not have MyChart so she does not know why they were sent there and no one called her.  Please give her a call at [phone]

## 2016-12-17 NOTE — Telephone Encounter (Signed)
Please post results

## 2016-12-17 NOTE — Telephone Encounter (Signed)
PT CALLING ABOUT LABS 

## 2016-12-18 NOTE — Telephone Encounter (Signed)
Pt given lab results with instructions to start taking OTC Vitamin d3 supplements Verbalized understanding. Would like results mailed to home

## 2016-12-18 NOTE — Telephone Encounter (Signed)
Let her know her results were all normal.  She should take 2000 International units of vitamin D3 over the counter because her lab result was low. Otherwise everything was normal. Let her know that I deactivated her mychart so that their will not be a concern like this in the future.   Thank you.

## 2016-12-19 NOTE — Telephone Encounter (Signed)
Results mailed 

## 2017-02-27 ENCOUNTER — Encounter: Payer: Self-pay | Admitting: Family Medicine

## 2017-02-27 ENCOUNTER — Ambulatory Visit (INDEPENDENT_AMBULATORY_CARE_PROVIDER_SITE_OTHER): Payer: Self-pay | Admitting: Family Medicine

## 2017-02-27 VITALS — BP 114/66 | HR 82 | Temp 98.7°F | Resp 18 | Ht 61.22 in | Wt 153.0 lb

## 2017-02-27 DIAGNOSIS — B379 Candidiasis, unspecified: Secondary | ICD-10-CM

## 2017-02-27 DIAGNOSIS — M62830 Muscle spasm of back: Secondary | ICD-10-CM

## 2017-02-27 DIAGNOSIS — R102 Pelvic and perineal pain: Secondary | ICD-10-CM

## 2017-02-27 DIAGNOSIS — N898 Other specified noninflammatory disorders of vagina: Secondary | ICD-10-CM

## 2017-02-27 LAB — POCT URINALYSIS DIP (MANUAL ENTRY)
Bilirubin, UA: NEGATIVE
Blood, UA: NEGATIVE
Glucose, UA: NEGATIVE mg/dL
Ketones, POC UA: NEGATIVE mg/dL
Leukocytes, UA: NEGATIVE
Nitrite, UA: NEGATIVE
Protein Ur, POC: NEGATIVE mg/dL
Spec Grav, UA: <=1.005 — AB
Urobilinogen, UA: 0.2 U/dL
pH, UA: 6.5

## 2017-02-27 LAB — POCT WET + KOH PREP: Trich by wet prep: ABSENT

## 2017-02-27 LAB — POC MICROSCOPIC URINALYSIS (UMFC): MUCUS RE: ABSENT

## 2017-02-27 MED ORDER — CYCLOBENZAPRINE HCL 5 MG PO TABS: 5.0000 mg | ORAL_TABLET | Freq: Three times a day (TID) | ORAL | 0 refills | Status: DC | PRN

## 2017-02-27 MED ORDER — FLUCONAZOLE 150 MG PO TABS: 150.0000 mg | ORAL_TABLET | Freq: Once | ORAL | 0 refills | Status: AC

## 2017-02-27 NOTE — Patient Instructions (Addendum)
   IF you received an x-ray today, you will receive an invoice from [facility]. Please contact [facility] at [phone] with questions or concerns regarding your invoice.   IF you received labwork today, you will receive an invoice from [facility]. Please contact [facility] at [phone] with questions or concerns regarding your invoice.   Our billing staff will not be able to assist you with questions regarding bills from these companies.  You will be contacted with the lab results as soon as they are available. The fastest way to get your results is to activate your My Chart account. Instructions are located on the last page of this paperwork. If you have not heard from us regarding the results in 2 weeks, please contact this office.      Low Back Sprain Rehab Ask your health care provider which exercises are safe for you. Do exercises exactly as told by your health care provider and adjust them as directed. It is normal to feel mild stretching, pulling, tightness, or discomfort as you do these exercises, but you should stop right away if you feel sudden pain or your pain gets worse. Do not begin these exercises until told by your health care provider. Stretching and range of motion exercises These exercises warm up your muscles and joints and improve the movement and flexibility of your back. These exercises also help to relieve pain, numbness, and tingling. Exercise A: Lumbar rotation  1. Lie on your back on a firm surface and bend your knees. 2. Straighten your arms out to your sides so each arm forms an "L" shape with a side of your body (a 90 degree angle). 3. Slowly move both of your knees to one side of your body until you feel a stretch in your lower back. Try not to let your shoulders move off of the floor. 4. Hold for __________ seconds. 5. Tense your abdominal muscles and slowly move your knees back to the starting position. 6. Repeat this exercise on the  other side of your body. Repeat __________ times. Complete this exercise __________ times a day. Exercise B: Prone extension on elbows  1. Lie on your abdomen on a firm surface. 2. Prop yourself up on your elbows. 3. Use your arms to help lift your chest up until you feel a gentle stretch in your abdomen and your lower back. ? This will place some of your body weight on your elbows. If this is uncomfortable, try stacking pillows under your chest. ? Your hips should stay down, against the surface that you are lying on. Keep your hip and back muscles relaxed. 4. Hold for __________ seconds. 5. Slowly relax your upper body and return to the starting position. Repeat __________ times. Complete this exercise __________ times a day. Strengthening exercises These exercises build strength and endurance in your back. Endurance is the ability to use your muscles for a long time, even after they get tired. Exercise C: Pelvic tilt 1. Lie on your back on a firm surface. Bend your knees and keep your feet flat. 2. Tense your abdominal muscles. Tip your pelvis up toward the ceiling and flatten your lower back into the floor. ? To help with this exercise, you may place a small towel under your lower back and try to push your back into the towel. 3. Hold for __________ seconds. 4. Let your muscles relax completely before you repeat this exercise. Repeat __________ times. Complete this exercise __________ times a day. Exercise D: Alternating   arm and leg raises  1. Get on your hands and knees on a firm surface. If you are on a hard floor, you may want to use padding to cushion your knees, such as an exercise mat. 2. Line up your arms and legs. Your hands should be below your shoulders, and your knees should be below your hips. 3. Lift your left leg behind you. At the same time, raise your right arm and straighten it in front of you. ? Do not lift your leg higher than your hip. ? Do not lift your arm higher  than your shoulder. ? Keep your abdominal and back muscles tight. ? Keep your hips facing the ground. ? Do not arch your back. ? Keep your balance carefully, and do not hold your breath. 4. Hold for __________ seconds. 5. Slowly return to the starting position and repeat with your right leg and your left arm. Repeat __________ times. Complete this exercise __________ times a day. Exercise E: Abdominal set with straight leg raise  1. Lie on your back on a firm surface. 2. Bend one of your knees and keep your other leg straight. 3. Tense your abdominal muscles and lift your straight leg up, 4-6 inches (10-15 cm) off the ground. 4. Keep your abdominal muscles tight and hold for __________ seconds. ? Do not hold your breath. ? Do not arch your back. Keep it flat against the ground. 5. Keep your abdominal muscles tense as you slowly lower your leg back to the starting position. 6. Repeat with your other leg. Repeat __________ times. Complete this exercise __________ times a day. Posture and body mechanics  Body mechanics refers to the movements and positions of your body while you do your daily activities. Posture is part of body mechanics. Good posture and healthy body mechanics can help to relieve stress in your body's tissues and joints. Good posture means that your spine is in its natural S-curve position (your spine is neutral), your shoulders are pulled back slightly, and your head is not tipped forward. The following are general guidelines for applying improved posture and body mechanics to your everyday activities. Standing   When standing, keep your spine neutral and your feet about hip-width apart. Keep a slight bend in your knees. Your ears, shoulders, and hips should line up.  When you do a task in which you stand in one place for a long time, place one foot up on a stable object that is 2-4 inches (5-10 cm) high, such as a footstool. This helps keep your spine  neutral. Sitting   When sitting, keep your spine neutral and keep your feet flat on the floor. Use a footrest, if necessary, and keep your thighs parallel to the floor. Avoid rounding your shoulders, and avoid tilting your head forward.  When working at a desk or a computer, keep your desk at a height where your hands are slightly lower than your elbows. Slide your chair under your desk so you are close enough to maintain good posture.  When working at a computer, place your monitor at a height where you are looking straight ahead and you do not have to tilt your head forward or downward to look at the screen. Resting   When lying down and resting, avoid positions that are most painful for you.  If you have pain with activities such as sitting, bending, stooping, or squatting (flexion-based activities), lie in a position in which your body does not bend very much. For   example, avoid curling up on your side with your arms and knees near your chest (fetal position).  If you have pain with activities such as standing for a long time or reaching with your arms (extension-based activities), lie with your spine in a neutral position and bend your knees slightly. Try the following positions:  Lying on your side with a pillow between your knees.  Lying on your back with a pillow under your knees. Lifting   When lifting objects, keep your feet at least shoulder-width apart and tighten your abdominal muscles.  Bend your knees and hips and keep your spine neutral. It is important to lift using the strength of your legs, not your back. Do not lock your knees straight out.  Always ask for help to lift heavy or awkward objects. This information is not intended to replace advice given to you by your health care provider. Make sure you discuss any questions you have with your health care provider. Document Released: 09/15/2005 Document Revised: 05/22/2016 Document Reviewed: 06/27/2015 Elsevier  Interactive Patient Education  2018 [employer]vier Inc.  

## 2017-02-27 NOTE — Progress Notes (Signed)
Chief Complaint  Patient presents with  . Back Pain     lower back x 1 week with pelvic pain.     HPI Vaginal discharge Pt reports cramping pain that is similar to her menstrual pain She denies vaginal discharge today A week ago she had a thin vaginal discharge Her period is due any day now She also has come burning pain when she urinates that is mild Patient's last menstrual period was 02/07/2017 (exact date).  Low back pain She has some mid low back pain  The pain does not radiate She cleans houses for a living and reports that she felt like last week she was twisting and could feel a sharp pain in her right to mid back She denies numbness or tingling   Past Medical History:  Diagnosis Date  . Abnormal Pap smear   . Depression   . Hyperlipemia   . 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 Calcium-Magnesium-Vitamin D (CALCIUM 500 PO) Take by mouth.    . cetirizine (ZYRTEC) 10 MG tablet Take 10 mg by mouth daily.    . cyclobenzaprine (FLEXERIL) 5 MG tablet Take 1 tablet (5 mg total) by mouth 3 (three) times daily as needed for muscle spasms. 30 tablet 0  . fluconazole (DIFLUCAN) 150 MG tablet Take 1 tablet (150 mg total) by mouth once. Repeat 2nd dose in 3 days 2 tablet 0  . naproxen (NAPROSYN) 500 MG tablet Take 1 tablet (500 mg total) by mouth 2 (two) times daily with a meal. 60 tablet 1  . omega-3 acid ethyl esters (LOVAZA) 1 G capsule Take by mouth 2 (two) times daily.     No current facility-administered medications for this visit.     Allergies:  Allergies  Allergen Reactions  . Tylenol [Acetaminophen] Rash    Past Surgical History:  Procedure Laterality Date  . CESAREAN SECTION      Social History   Social History  . Marital status: Single    Spouse name: N/A  . Number of children: N/A  . Years of education: N/A   Social History Main Topics  . Smoking status: Never Smoker  . Smokeless  tobacco: Never Used  . Alcohol use No  . Drug use: No  . Sexual activity: Yes    Birth control/ protection: Pill   Other Topics Concern  . None   Social History Narrative  . None    Review of Systems  Constitutional: Negative for chills and fever.  Cardiovascular: Negative for chest pain and palpitations.  Genitourinary: Positive for dysuria. Negative for frequency and urgency.  Musculoskeletal: Positive for back pain. Negative for falls and myalgias.  Neurological: Negative for dizziness and headaches.  Psychiatric/Behavioral: Negative for depression. The patient is not nervous/anxious.     Objective: Vitals:   02/27/17 1430  BP: 114/66  Pulse: 82  Resp: 18  Temp: 98.7 F (37.1 C)  TempSrc: Oral  SpO2: 98%  Weight: 153 lb (69.4 kg)  Height: 5' 1.22" (1.555 m)    Physical Exam  Constitutional: She appears well-developed and well-nourished.  Pulmonary/Chest: Effort normal.  Abdominal: Soft. Bowel sounds are normal. She exhibits no distension and no mass. There is no tenderness. There is no guarding.  Musculoskeletal:       Back:   Vaginal exam- chaperone present [place] normal bilaterally without skin lesions Urethral meatus normal appearing without erythema Vagina with white discharge No CMT, ovaries  small and not palpable Uterus midline, nontender  Component     Latest Ref Rng & Units 02/27/2017  Yeast by KOH     Absent Present (A)  Yeast by wet prep     Absent Present (A)  WBC by wet prep     Few None (A)  Clue Cells Wet Prep HPF POC     None None  Trich by wet prep     Absent Absent  Bacteria Wet Prep HPF POC     Few Many (A)  Epithelial Cells By [employer] Pref ([facility])     None, Few, Too numerous to count Moderate (A)  RBC,UR,HPF,POC     None RBC/hpf None   UA - no Nitrites or LE Assessment and Plan Ailish was seen today for back pain.  Diagnoses and all orders for this visit:  Suprapubic abdominal pain- likely due to yeast vaginitis -     POCT  urinalysis dipstick -     POCT Microscopic Urinalysis ([facility])  Vaginal discharge- yeast infection noted -     POCT Wet + KOH Prep  Muscle spasm of back-  Advised hydration, aspercreme and flexeril  Yeast infection-  Advised diflucan  Other orders -     fluconazole (DIFLUCAN) 150 MG tablet; Take 1 tablet (150 mg total) by mouth once. Repeat 2nd dose in 3 days -     cyclobenzaprine (FLEXERIL) 5 MG tablet; Take 1 tablet (5 mg total) by mouth 3 (three) times daily as needed for muscle spasms.     [place]

## 2017-03-03 ENCOUNTER — Ambulatory Visit (INDEPENDENT_AMBULATORY_CARE_PROVIDER_SITE_OTHER): Payer: Self-pay | Admitting: Family Medicine

## 2017-03-03 ENCOUNTER — Encounter: Payer: Self-pay | Admitting: Family Medicine

## 2017-03-03 VITALS — BP 114/68 | HR 94 | Temp 98.9°F | Resp 17 | Ht 61.5 in | Wt 150.0 lb

## 2017-03-03 DIAGNOSIS — R1030 Lower abdominal pain, unspecified: Secondary | ICD-10-CM

## 2017-03-03 DIAGNOSIS — R102 Pelvic and perineal pain: Secondary | ICD-10-CM

## 2017-03-03 LAB — POCT URINALYSIS DIP (MANUAL ENTRY)
BILIRUBIN UA: NEGATIVE
GLUCOSE UA: NEGATIVE mg/dL
Ketones, POC UA: NEGATIVE mg/dL
LEUKOCYTES UA: NEGATIVE
NITRITE UA: NEGATIVE
Protein Ur, POC: NEGATIVE mg/dL
RBC UA: NEGATIVE
Spec Grav, UA: 1.02 (ref 1.010–1.025)
Urobilinogen, UA: 0.2 E.U./dL
pH, UA: 8 (ref 5.0–8.0)

## 2017-03-03 LAB — POC MICROSCOPIC URINALYSIS (UMFC): MUCUS RE: ABSENT

## 2017-03-03 MED ORDER — TERCONAZOLE 0.8 % VA CREA: 1.0000 | TOPICAL_CREAM | Freq: Every day | VAGINAL | 0 refills | Status: AC

## 2017-03-03 MED ORDER — METRONIDAZOLE 500 MG PO TABS: 500.0000 mg | ORAL_TABLET | Freq: Two times a day (BID) | ORAL | 0 refills | Status: DC

## 2017-03-03 NOTE — Progress Notes (Signed)
Chief Complaint  Patient presents with  . Back Pain  . Abdominal Pain    HPI   Pt reports that she is still have low back pain and bilateral pelvic pain She has completed her diflucan that was prescribed for a yeast infection She tried aspercreme and flexeril without much improvement She states that she has pain that feels like period cramping She started having panic attacks because she has a family history of reproductive cancer  She reports that her bilateral pelvic pain is worse with walking and activity She denies any aggravation of her pain with BM or voiding of the bladder  Her pap smear was done at [facility] and was negative 04/29/2016   Past Medical History:  Diagnosis Date  . Abnormal Pap smear   . Depression   . Hyperlipemia   . 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 Calcium-Magnesium-Vitamin D (CALCIUM 500 PO) Take by mouth.    . cetirizine (ZYRTEC) 10 MG tablet Take 10 mg by mouth daily.    . cyclobenzaprine (FLEXERIL) 5 MG tablet Take 1 tablet (5 mg total) by mouth 3 (three) times daily as needed for muscle spasms. 30 tablet 0  . naproxen (NAPROSYN) 500 MG tablet Take 1 tablet (500 mg total) by mouth 2 (two) times daily with a meal. 60 tablet 1  . omega-3 acid ethyl esters (LOVAZA) 1 G capsule Take by mouth 2 (two) times daily.    . metroNIDAZOLE (FLAGYL) 500 MG tablet Take 1 tablet (500 mg total) by mouth 2 (two) times daily. 14 tablet 0  . terconazole (TERAZOL 3) 0.8 % vaginal cream Place 1 applicator vaginally at bedtime. For 3 days 20 g 0   No current facility-administered medications for this visit.     Allergies:  Allergies  Allergen Reactions  . Tylenol [Acetaminophen] Rash    Past Surgical History:  Procedure Laterality Date  . CESAREAN SECTION      Social History   Social History  . Marital status: Single    Spouse name: N/A  . Number of children: N/A  . Years of  education: N/A   Social History Main Topics  . Smoking status: Never Smoker  . Smokeless tobacco: Never Used  . Alcohol use No  . Drug use: No  . Sexual activity: Yes    Birth control/ protection: Pill   Other Topics Concern  . None   Social History Narrative  . None    ROS See hpi  Objective: Vitals:   03/03/17 1132  BP: 114/68  Pulse: 94  Resp: 17  Temp: 98.9 F (37.2 C)  TempSrc: Oral  SpO2: 98%  Weight: 150 lb (68 kg)  Height: 5' 1.5" (1.562 m)    Physical Exam Vaginal exam- chaperone present Davy Pique normal bilaterally without skin lesions Urethral meatus normal appearing without erythema Vagina with white thin discharge No CMT, ovaries small and not palpable Uterus midline, nontender   Component     Latest Ref Rng & Units 03/03/2017 03/03/2017        11:45 AM 11:45 AM  Color, UA     yellow yellow   Clarity, UA     clear clear   Glucose     negative mg/dL negative   Bilirubin, UA     negative negative negative  Specific Gravity, UA     1.010 - 1.025 1.020   RBC, UA  negative negative   pH, UA     5.0 - 8.0 8.0   Protein, UA     negative mg/dL negative   Urobilinogen, UA     0.2 or 1.0 E.U./dL 0.2   Nitrite, UA     Negative Negative   Leukocytes, UA     Negative Negative   ment and Plan Wyona was seen today for back pain and abdominal pain.  Diagnoses and all orders for this visit:  Lower abdominal pain -     POCT urinalysis dipstick -     POCT Microscopic Urinalysis ([facility]) -     US Pelvis Complete; Future  Pelvic pain in female -     Cancel: POCT Wet + KOH Prep -     US Pelvis Complete; Future  Will treat with terazole and flagyl Wet prep was + for yeast Will also get [place] to evaluation for cyst or uterine fibroids Those were not palpated on GU exam -     metroNIDAZOLE (FLAGYL) 500 MG tablet; Take 1 tablet (500 mg total) by mouth 2 (two) times daily. -     terconazole (TERAZOL 3) 0.8 % vaginal cream; Place 1 applicator  vaginally at bedtime. For 3 days     Deaven Barron A Hussam Muniz

## 2017-03-03 NOTE — Patient Instructions (Addendum)
     IF you received an x-ray today, you will receive an invoice from [facility]. Please contact  Radiology at [phone] with questions or concerns regarding your invoice.   IF you received labwork today, you will receive an invoice from [facility]. Please contact [facility] at [phone] with questions or concerns regarding your invoice.   Our billing staff will not be able to assist you with questions regarding bills from these companies.  You will be contacted with the lab results as soon as they are available. The fastest way to get your results is to activate your My Chart account. Instructions are located on the last page of this paperwork. If you have not heard from us regarding the results in 2 weeks, please contact this office.      Pelvic Pain, Female Pelvic pain is pain in your lower abdomen, below your belly button and between your hips. The pain may start suddenly (acute), keep coming back (recurring), or last a long time (chronic). Pelvic pain that lasts longer than six months is considered chronic. Pelvic pain may affect your:  Reproductive organs.  Urinary system.  Digestive tract.  Musculoskeletal system.  There are many potential causes of pelvic pain. Sometimes, the pain can be a result of digestive or urinary conditions, strained muscles or ligaments, or even reproductive conditions. Sometimes the cause of pelvic pain is not known. Follow these instructions at home:  Take over-the-counter and prescription medicines only as told by your health care provider.  Rest as told by your health care provider.  Do not have sex it if hurts.  Keep a journal of your pelvic pain. Write down: ? When the pain started. ? Where the pain is located. ? What seems to make the pain better or worse, such as food or your menstrual cycle. ? Any symptoms you have along with the pain.  Keep all follow-up visits as told by your health care provider. This is  important. Contact a health care provider if:  Medicine does not help your pain.  Your pain comes back.  You have new symptoms.  You have abnormal vaginal discharge or bleeding, including bleeding after menopause.  You have a fever or chills.  You are constipated.  You have blood in your urine or stool.  You have foul-smelling urine.  You feel weak or lightheaded. Get help right away if:  You have sudden severe pain.  Your pain gets steadily worse.  You have severe pain along with fever, nausea, vomiting, or excessive sweating.  You lose consciousness. This information is not intended to replace advice given to you by your health care provider. Make sure you discuss any questions you have with your health care provider. Document Released: 08/12/2004 Document Revised: 10/10/2015 Document Reviewed: 07/06/2015 Elsevier Interactive Patient Education  2018 Elsevier Inc.  

## 2017-03-04 ENCOUNTER — Telehealth: Payer: Self-pay | Admitting: General Practice

## 2017-03-04 ENCOUNTER — Ambulatory Visit: Payer: Self-pay

## 2017-03-04 NOTE — Telephone Encounter (Signed)
Pt has questions about the terconazole medication   Best number [phone]

## 2017-03-05 ENCOUNTER — Other Ambulatory Visit: Payer: Self-pay | Admitting: Family Medicine

## 2017-03-05 DIAGNOSIS — R102 Pelvic and perineal pain: Secondary | ICD-10-CM

## 2017-03-05 DIAGNOSIS — R1030 Lower abdominal pain, unspecified: Secondary | ICD-10-CM

## 2017-03-05 NOTE — Telephone Encounter (Signed)
[place] scheduled tomorrow terazole cream states caution use with abd pain. I reassured she can take this fir her yeast

## 2017-03-06 ENCOUNTER — Ambulatory Visit
Admission: RE | Admit: 2017-03-06 | Discharge: 2017-03-06 | Disposition: A | Discharge status: Home / Self Care | Payer: No Typology Code available for payment source | Source: Ambulatory Visit | Attending: Family Medicine | Admitting: Family Medicine

## 2017-03-06 DIAGNOSIS — R102 Pelvic and perineal pain: Secondary | ICD-10-CM

## 2017-03-06 DIAGNOSIS — R1030 Lower abdominal pain, unspecified: Secondary | ICD-10-CM

## 2017-03-10 ENCOUNTER — Ambulatory Visit: Payer: Self-pay | Attending: Internal Medicine

## 2017-03-12 ENCOUNTER — Ambulatory Visit (INDEPENDENT_AMBULATORY_CARE_PROVIDER_SITE_OTHER): Payer: Self-pay | Admitting: Family Medicine

## 2017-03-12 ENCOUNTER — Encounter: Payer: Self-pay | Admitting: Family Medicine

## 2017-03-12 VITALS — BP 118/67 | HR 82 | Temp 98.5°F | Resp 16 | Ht 61.5 in | Wt 153.2 lb

## 2017-03-12 DIAGNOSIS — D252 Subserosal leiomyoma of uterus: Secondary | ICD-10-CM

## 2017-03-12 DIAGNOSIS — R102 Pelvic and perineal pain: Secondary | ICD-10-CM

## 2017-03-12 MED ORDER — MEDROXYPROGESTERONE ACETATE 150 MG/ML IM SUSP: 150.0000 mg | INTRAMUSCULAR | Status: DC

## 2017-03-12 MED ORDER — IBUPROFEN 800 MG PO TABS: 800.0000 mg | ORAL_TABLET | Freq: Three times a day (TID) | ORAL | 0 refills | Status: DC | PRN

## 2017-03-12 MED ORDER — NORGESTIMATE-ETH ESTRADIOL 0.25-35 MG-MCG PO TABS: 1.0000 | ORAL_TABLET | Freq: Every day | ORAL | 11 refills | Status: DC

## 2017-03-12 NOTE — Progress Notes (Signed)
Chief Complaint  Patient presents with  . Follow-up    HPI Patient here to discuss her ultrasound results Pt reports that in the past year she has heavy periods lasting 6-7 days in addition to abdominal bloating and pain She states that in the past year her pain has been daily  Not just around her periods She states that in the past 4 months the cramps have been particularly severe Patient's last menstrual period was 03/05/2017.  Past Medical History:  Diagnosis Date  . Abnormal Pap smear   . Depression   . Hyperlipemia   . 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 Calcium-Magnesium-Vitamin D (CALCIUM 500 PO) Take by mouth.    . cyclobenzaprine (FLEXERIL) 5 MG tablet Take 1 tablet (5 mg total) by mouth 3 (three) times daily as needed for muscle spasms. 30 tablet 0  . omega-3 acid ethyl esters (LOVAZA) 1 G capsule Take by mouth 2 (two) times daily.    . cetirizine (ZYRTEC) 10 MG tablet Take 10 mg by mouth daily.    Marland Kitchen ibuprofen (ADVIL,MOTRIN) 800 MG tablet Take 1 tablet (800 mg total) by mouth every 8 (eight) hours as needed. 60 tablet 0  . norgestimate-ethinyl estradiol (SPRINTEC 28) 0.25-35 MG-MCG tablet Take 1 tablet by mouth daily. 1 Package 11   No current facility-administered medications for this visit.     Allergies:  Allergies  Allergen Reactions  . Tylenol [Acetaminophen] Rash    Past Surgical History:  Procedure Laterality Date  . CESAREAN SECTION      Social History   Social History  . Marital status: Single    Spouse name: N/A  . Number of children: N/A  . Years of education: N/A   Social History Main Topics  . Smoking status: Never Smoker  . Smokeless tobacco: Never Used  . Alcohol use No  . Drug use: No  . Sexual activity: Yes    Birth control/ protection: Pill   Other Topics Concern  . None   Social History Narrative  . None    Review of Systems  Constitutional: Negative for  chills, fever and weight loss.  Eyes: Negative for blurred vision and double vision.  Cardiovascular: Negative for chest pain, palpitations and orthopnea.  Gastrointestinal: Positive for abdominal pain. Negative for nausea and vomiting.  Genitourinary: Negative for dysuria and urgency.  Musculoskeletal: Positive for back pain. Negative for joint pain.  Neurological: Negative for dizziness and headaches.  Psychiatric/Behavioral: Negative for depression. The patient does not have insomnia.     Objective: Vitals:   03/12/17 1521  BP: 118/67  Pulse: 82  Resp: 16  Temp: 98.5 F (36.9 C)  TempSrc: Oral  SpO2: 97%  Weight: 153 lb 3.2 oz (69.5 kg)  Height: 5' 1.5" (1.562 m)    Physical Exam  Constitutional: She is oriented to person, place, and time. She appears well-developed and well-nourished.  Eyes: Conjunctivae and EOM are normal.  Pulmonary/Chest: Effort normal.  Neurological: She is alert and oriented to person, place, and time.  Psychiatric: She has a normal mood and affect. Her behavior is normal. Judgment and thought content normal.      COMPARISON:  08/03/2013  FINDINGS: Uterus  Measurements: 10.7 x 4.8 x 6.9 cm. Mildly heterogeneous echotexture. Left posterolateral small subserosal uterine fibroid measures 2.7 x 1.9 x 2.4 cm  Endometrium  Thickness: 8 mm.  No focal abnormality visualized.  Right ovary  Measurements:  3.4 x 2.0 x 2.2 cm. Normal appearance/no adnexal mass.  Left ovary  Measurements: 2.5 x 2.1 x 2.1 cm. Normal appearance/no adnexal mass.  Other findings  No abnormal free fluid.  IMPRESSION: 2.7 cm small left uterine subserosal fibroid.  No other acute finding by ultrasound   Electronically Signed   By: Jerilynn Mages.  Shick M.D.   On: 03/06/2017 15:00    Assessment and Plan Nashayla was seen today for follow-up.  Diagnoses and all orders for this visit:  Fibroids, subserous -     Discontinue: medroxyPROGESTERone  (DEPO-PROVERA) injection 150 mg; Inject 1 mL (150 mg total) into the muscle every 3 (three) months. -     norgestimate-ethinyl estradiol (SPRINTEC 28) 0.25-35 MG-MCG tablet; Take 1 tablet by mouth daily.  Pelvic pain in female -     norgestimate-ethinyl estradiol (Bessie 28) 0.25-35 MG-MCG tablet; Take 1 tablet by mouth daily.  Other orders -     ibuprofen (ADVIL,MOTRIN) 800 MG tablet; Take 1 tablet (800 mg total) by mouth every 8 (eight) hours as needed.  Started OCP for her fibroids Due to cost of Depo will use OCPs Risks and benefits discussed NSAIDs for pain 3 months follow up on abdominal pain    Anvita Hirata A Ayuub Penley

## 2017-03-12 NOTE — Patient Instructions (Addendum)
IF you received an x-ray today, you will receive an invoice from [facility]. Please contact [facility] at [phone] with questions or concerns regarding your invoice.   IF you received labwork today, you will receive an invoice from [place]. Please contact [facility] at [phone] with questions or concerns regarding your invoice.   Our billing staff will not be able to assist you with questions regarding bills from these companies.  You will be contacted with the lab results as soon as they are available. The fastest way to get your results is to activate your My Chart account. Instructions are located on the last page of this paperwork. If you have not heard from [place] regarding the results in 2 weeks, please contact this office.      Fibromas uterinos (Uterine Fibroids) Los fibromas uterinos son masas (tumores) de tejido que pueden desarrollarse en el vientre (tero). Tambin se los [employer]. Este tipo de tumor no es Radio broadcast assistant (benigno) y no se disemina a Airline pilot del cuerpo fuera de la zona plvica, la cual se encuentra entre los huesos de la cadera. En ocasiones, los fibromas pueden crecer en las trompas de [place], en el cuello del tero o en las estructuras de soporte (ligamentos) que rodean el tero. Una mujer puede tener uno o ms fibromas. Los fibromas pueden tener diferente tamao y Conrad, y crecer en distintas partes del tero. Algunos pueden crecer hasta volverse bastante grandes. La mayora no requiere tratamiento mdico. CAUSAS Un fibroma puede desarrollarse cuando una nica clula uterina contina creciendo (se multiplica). La mayora de las clulas del cuerpo humano tienen un mecanismo de control que impide que se multipliquen sin control. [place] los sntomas se pueden incluir los siguientes:  Hemorragias intensas durante la menstruacin.  Prdidas de sangre o [employer].  Dolor y opresin en la  pelvis.  Problemas de la vejiga, como necesidad de Garment/textile technologist con ms frecuencia (polaquiuria) o necesidad imperiosa de Garment/textile technologist.  Incapacidad para reproducir (infertilidad).  Abortos espontneos. DIAGNSTICO Los fibromas uterinos se diagnostican con un examen fsico. El mdico puede palpar los tumores grumosos durante un examen plvico. Pueden realizarse ecografas y Ardelia Mems resonancia magntica para determinar el tamao y la ubicacin de los fibromas, as como la cantidad. TRATAMIENTO El tratamiento puede incluir lo siguiente:  Observacin cautelosa. Esto requiere que el mdico controle el fibroma para saber si crece o se achica. Siga las recomendaciones del mdico respecto de la frecuencia con la que debe realizarse los controles.  Medicamentos hormonales. Pueden tomarse por va oral o administrarse a travs de un dispositivo intrauterino (DIU).  Ciruga. ? Extirpacin de los fibromas (miomectoma) o del tero (histerectoma). ? Suprimir la irrigacin sangunea a los fibromas (embolizacin de la arteria uterina). Si los fibromas le traen problemas de fertilidad y tiene deseos de quedar [place], el mdico puede recomendar su extirpacin. INSTRUCCIONES PARA EL CUIDADO EN EL HOGAR  Concurra a todas las visitas de control como se lo haya indicado el mdico. Esto es importante.  Tome los medicamentos de venta libre y los recetados solamente como se lo haya indicado el mdico. ? Si le recetaron un tratamiento hormonal, tome los medicamentos hormonales exactamente como se lo indicaron.  Consulte al [employer] sobre tomar comprimidos de hierro y Garment/textile technologist la cantidad de verduras de hoja color verde oscuro en la dieta. Estas medidas pueden ayudar a Transport planner de hierro en la [place], que pueden verse afectados por las hemorragias menstruales intensas.  Preste mucha atencin a la menstruacin e informe al mdico si hay algn cambio, por ejemplo: ? Aumento del flujo de sangre que le exige el  uso de ms compresas o tampones que los que utiliza normalmente cada mes. ? Un cambio en la cantidad de [employer] dura la menstruacin cada mes. ? Un cambio en los sntomas asociados con la [place], como clicos abdominales o dolor de espalda. SOLICITE ATENCIN MDICA SI:  Tiene dolor plvico, dolor de espalda o clicos abdominales que los medicamentos no Engineer, petroleum.  Observa un aumento del sangrado entre y [employer].  Empapa los tampones o las compresas en el trmino de media hora o [place].  Se siente mareada, muy cansada o dbil. SOLICITE ATENCIN MDICA DE INMEDIATO SI:  Se desmaya.  El dolor plvico aumenta repentinamente. Esta informacin no tiene Marine scientist el consejo del mdico. Asegrese de hacerle al mdico cualquier pregunta que tenga. Document Released: 09/15/2005 Document Revised: 01/07/2016 Document Reviewed: 03/14/2014 Elsevier Interactive Patient Education  [employer].

## 2017-03-20 ENCOUNTER — Encounter: Payer: Self-pay | Admitting: Obstetrics & Gynecology

## 2017-03-20 ENCOUNTER — Ambulatory Visit (INDEPENDENT_AMBULATORY_CARE_PROVIDER_SITE_OTHER): Payer: Self-pay | Admitting: Obstetrics & Gynecology

## 2017-03-20 VITALS — BP 132/86 | Ht 61.5 in | Wt 145.0 lb

## 2017-03-20 DIAGNOSIS — B3731 Acute candidiasis of vulva and vagina: Secondary | ICD-10-CM

## 2017-03-20 DIAGNOSIS — R350 Frequency of micturition: Secondary | ICD-10-CM

## 2017-03-20 DIAGNOSIS — B373 Candidiasis of vulva and vagina: Secondary | ICD-10-CM

## 2017-03-20 DIAGNOSIS — Z113 Encounter for screening for infections with a predominantly sexual mode of transmission: Secondary | ICD-10-CM

## 2017-03-20 DIAGNOSIS — N945 Secondary dysmenorrhea: Secondary | ICD-10-CM

## 2017-03-20 DIAGNOSIS — R102 Pelvic and perineal pain: Secondary | ICD-10-CM

## 2017-03-20 DIAGNOSIS — D252 Subserosal leiomyoma of uterus: Secondary | ICD-10-CM

## 2017-03-20 DIAGNOSIS — N898 Other specified noninflammatory disorders of vagina: Secondary | ICD-10-CM

## 2017-03-20 LAB — URINALYSIS W MICROSCOPIC + REFLEX CULTURE
Bilirubin Urine: NEGATIVE
CASTS: NONE SEEN [LPF]
CRYSTALS: NONE SEEN [HPF]
Glucose, UA: NEGATIVE
Hgb urine dipstick: NEGATIVE
Ketones, ur: NEGATIVE
Leukocytes, UA: NEGATIVE
NITRITE: NEGATIVE
PH: 8.5 — AB (ref 5.0–8.0)
Protein, ur: NEGATIVE
RBC / HPF: NONE SEEN RBC/HPF (ref <=2)
SPECIFIC GRAVITY, URINE: 1.015 (ref 1.001–1.035)
YEAST: NONE SEEN [HPF]

## 2017-03-20 MED ORDER — FLUCONAZOLE 150 MG PO TABS: 150.0000 mg | ORAL_TABLET | Freq: Every day | ORAL | 1 refills | Status: AC

## 2017-03-20 MED ORDER — MEDROXYPROGESTERONE ACETATE 150 MG/ML IM SUSP
150.0000 mg | Freq: Once | INTRAMUSCULAR | Status: AC
  Administered 2017-03-20: 150 mg via INTRAMUSCULAR

## 2017-03-20 NOTE — Patient Instructions (Addendum)
1. Female pelvic pain Dysmenorrhea and chronic Lt Pelvic pain.  Tender nodules Left vaginal fornix.  Pelvic US showing a heterogeneous Myometrium.  Probable Adenomyosis/Endometriosis.  R/O STI/PID.  Given that the pain is likely associated with Adenomyosis/Endometriosis and that the patient doesn't want to have more children at 46 yo, decision to try DepoProvera to control probable disease/Sxs.  DepoProvera 1st dose given today.  D/C BCPs.  Will f/u in 4 wks for early second dose if improving.  If no improvement, will consider Dx LPS.  Pending Gono-Chlam, will wait on result to treat or not.   2. Vaginal lesion Bx taken.  R/O Endometriotic nodules.  Possible inclusion cysts. - Pathology Report  3. 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  Reassured. - medroxyPROGESTERone (DEPO-PROVERA) injection 150 mg; Inject 1 mL (150 mg total) into the muscle once.  4. Secondary dysmenorrhea Adenomyosis?  Dx and treatment reviewed.  DepoProvera started.  5. Yeast vaginitis Fluconazole 1 tab PO QD x 3 prescribed.  6. Urinary frequency U/A negative.  7. Screen for STD (sexually transmitted disease)  - GC/Chlamydia Probe Amp  Aimee Figueroa, it was a pleasure to meet you today!  I will inform you of your results as soon as available.   Endometriosis Endometriosis is a condition in which the tissue that lines the uterus (endometrium) grows outside of its normal location. The tissue may grow in many locations close to the uterus, but it commonly grows on the ovaries, fallopian tubes, vagina, or bowel. When the uterus sheds the endometrium every menstrual cycle, there is bleeding wherever the endometrial tissue is located. This can cause pain because blood is irritating to tissues that are not normally exposed to it. What are the causes? The cause of endometriosis is not known. What increases the risk? You may be more likely to develop endometriosis if  you:  Have a family history of endometriosis.  Have never given birth.  Started your period at age 40 or younger.  Have high levels of estrogen in your body.  Were exposed to a certain medicine (diethylstilbestrol) before you were born (in utero).  Had low birth weight.  Were born as a twin, triplet, or other multiple.  Have a BMI of less than 25. BMI is an estimate of body fat and is calculated from height and weight.  What are the signs or symptoms? Often, there are no symptoms of this condition. If you do have symptoms, they may:  Vary depending on where your endometrial tissue is growing.  Occur during your menstrual period (most common) or midcycle.  Come and go, or you may go months with no symptoms at all.  Stop with menopause.  Symptoms may include:  Pain in the back or abdomen.  Heavier bleeding during periods.  Pain during sex.  Painful bowel movements.  Infertility.  Pelvic pain.  Bleeding more than once a month.  How is this diagnosed? This condition is diagnosed based on your symptoms and a physical exam. You may have tests, such as:  Blood tests and urine tests. These may be done to help rule out other possible causes of your symptoms.  Ultrasound, to look for abnormal tissues.  An X-ray of the lower bowel (barium enema).  An ultrasound that is done through the vagina (transvaginally).  CT scan.  MRI.  Laparoscopy. In this procedure, a lighted, pencil-sized instrument called a laparoscope is inserted into your abdomen through an incision. The  laparoscope allows your health care provider to look at the organs inside your body and check for abnormal tissue to confirm the diagnosis. If abnormal tissue is found, your health care provider may remove a small piece of tissue (biopsy) to be examined under a microscope.  How is this treated? Treatment for this condition may include:  Medicines to relieve pain, such as NSAIDs.  Hormone therapy.  This involves using artificial (synthetic) hormones to reduce endometrial tissue growth. Your health care provider may recommend using a hormonal form of birth control, or other medicines.  Surgery. This may be done to remove abnormal endometrial tissue. ? In some cases, tissue may be removed using a laparoscope and a laser (laparoscopic laser treatment). ? In severe cases, surgery may be done to remove the fallopian tubes, uterus, and ovaries (hysterectomy).  Follow these instructions at home:  Take over-the-counter and prescription medicines only as told by your health care provider.  Do not drive or use heavy machinery while taking prescription pain medicine.  Try to avoid activities that cause pain, including sexual activity.  Keep all follow-up visits as told by your health care provider. This is important. Contact a health care provider if:  You have pain in the area between your hip bones (pelvic area) that occurs: ? Before, during, or after your period. ? In between your period and gets worse during your period. ? During or after sex. ? With bowel movements or urination, especially during your period.  You have problems getting pregnant.  You have a fever. Get help right away if:  You have severe pain that does not get better with medicine.  You have severe nausea and vomiting, or you cannot eat without vomiting.  You have pain that affects only the lower, right side of your abdomen.  You have abdominal pain that gets worse.  You have abdominal swelling.  You have blood in your stool. This information is not intended to replace advice given to you by your health care provider. Make sure you discuss any questions you have with your health care provider. Document Released: 09/12/2000 Document Revised: 06/20/2016 Document Reviewed: 02/16/2016 Elsevier Interactive Patient Education  [employer].

## 2017-03-20 NOTE — Addendum Note (Signed)
Addended by: Thurnell Garbe A on: 03/20/2017 02:08 PM   Modules accepted: Orders

## 2017-03-20 NOTE — Progress Notes (Signed)
Aimee Figueroa [birth year] [id number]   History:    46 y.o.  [id number] married  RP:  Worsening Left lower back and pelvic pain x 1 year  HPI:  Pelvic pain with cramping and bloating worsening x 1 year, for the past 4 weeks, the pain is present everyday.  Menses were every month, heavy x 6-7 days with severe dysmenorrhea.  Was using condoms as she doesn't want more children.  Had a pelvic US 6/8th showing a 2.7 cm SS Myoma and started on Sprintec 28 and Ibuprofen by Dr Amie Critchley on 6/14th in an attempt to control her pelvic pain.  No improvement since then.  Had a Yeast vaginitis early June treated with antifungal cream/tab and then an ABTx was given per patient.  No recent STI screen.  Mild deep dyspareunia towards the left pelvic area.  Urinary frequency, no burning with miction.  BMs rather soft with increased vegetables in nutrition recently, no rectal bleeding.  Past medical history,surgical history, family history and social history were all reviewed and documented in the EPIC chart.  Gynecologic History Patient's last menstrual period was 03/05/2017. Contraception: condoms and OCP (estrogen/progesterone) Last Pap: 04/2016. Results were: normal Last mammogram: 05/2016. Results were: Neg  Obstetric History OB History  Gravida Para Term Preterm AB Living  7 5 5  0 2 5  SAB TAB Ectopic Multiple Live Births  2       5    # Outcome Date GA Lbr Len/2nd Weight Sex Delivery Anes PTL Lv  7 Term 03/11/12 [redacted]w[redacted]d 12:51 / 00:15 8 lb 7.1 oz (3.83 kg) M VBAC EPI  LIV     Birth Comments: Weakness in right arm  6 Term 10/01/06    F CS-LTranv EPI N LIV     Birth Comments: breech  5 Term 01/02/03    M Vag-Spont None N LIV  4 Term 09/16/00    M Vag-Spont None N LIV  3 Term 09/16/96    F Vag-Spont EPI N LIV  2 SAB           1 SAB                ROS: A ROS was performed and pertinent positives and negatives are included in the history.  GENERAL: No fevers or chills. HEENT: No change in vision,  no earache, sore throat or sinus congestion. NECK: No pain or stiffness. CARDIOVASCULAR: No chest pain or pressure. No palpitations. PULMONARY: No shortness of breath, cough or wheeze. GASTROINTESTINAL: No abdominal pain, nausea, vomiting or diarrhea, melena or bright red blood per rectum. GENITOURINARY: No urinary frequency, urgency, hesitancy or dysuria. MUSCULOSKELETAL: No joint or muscle pain, no back pain, no recent trauma. DERMATOLOGIC: No rash, no itching, no lesions. ENDOCRINE: No polyuria, polydipsia, no heat or cold intolerance. No recent change in weight. HEMATOLOGICAL: No anemia or easy bruising or bleeding. NEUROLOGIC: No headache, seizures, numbness, tingling or weakness. PSYCHIATRIC: No depression, no loss of interest in normal activity or change in sleep pattern.     Exam:   BP 132/86   Ht 5' 1.5" (1.562 m)   Wt 145 lb (65.8 kg)   LMP 03/05/2017   BMI 26.95 kg/m   Body mass index is 26.95 kg/m.  General appearance : Well developed well nourished female. No acute distress HEENT: Eyes: no retinal hemorrhage or exudates,  Neck supple, trachea midline, no carotid bruits, no thyroidmegaly Lungs: Clear to auscultation, no rhonchi or wheezes, or rib retractions  Heart: Regular rate  and rhythm, no murmurs or gallops Breast:Examined in sitting and supine position were symmetrical in appearance, no palpable masses or tenderness,  no skin retraction, no nipple inversion, no nipple discharge, no skin discoloration, no axillary or supraclavicular lymphadenopathy Abdomen: no palpable masses or tenderness, no rebound or guarding Extremities: no edema or skin discoloration or tenderness  Pelvic:  Bartholin, Urethra, Skene Glands: Within normal limits             Vagina: No gross lesions or discharge.  Secretions typical of Yeast.  Left fornix tender nodules.  Bx taken.  Silver Nitrate applied.  Good hemostasis.  Cervix: No gross lesions or discharge.  Gono-Chlam done.  Uterus  AV, normal  size, shape and consistency, non-tender and mobile  Adnexae  Without masses.  Tender left adnexa, but mainly tender with vaginal fingers touching the nodules.  Anus and perineum  normal   Pelvic US 03/06/2017: Uterus: Measurements: 10.7 x 4.8 x 6.9 cm. Mildly heterogeneous echotexture. Left posterolateral small subserosal uterine fibroid measures 2.7 x1.9 x 2.4 cm Endometrium:  Thickness: 8 mm. No focal abnormality visualized. Right ovary:  Measurements: 3.4 x 2.0 x 2.2 cm. Normal appearance/no adnexal mass. Left ovary:  Measurements: 2.5 x 2.1 x 2.1 cm. Normal appearance/no adnexal mass. No abnormal free fluid. IMPRESSION: 2.7 cm small left uterine subserosal fibroid. No other acute finding by ultrasound   Assessment/Plan:  46 y.o.   1. Female pelvic pain Dysmenorrhea and chronic Lt Pelvic pain.  Tender nodules Left vaginal fornix.  Pelvic US showing a heterogeneous Myometrium.  Probable Adenomyosis/Endometriosis.  R/O STI/PID.  Given that the pain is likely associated with Adenomyosis/Endometriosis and that the patient doesn't want to have more children at 46 yo, decision to try DepoProvera to control probable disease/Sxs.  DepoProvera 1st dose given today.  D/C BCPs.  Will f/u in 4 wks for early second dose if improving.  If no improvement, will consider Dx LPS.  Pending Gono-Chlam, will wait on result to treat or not.   2. Vaginal lesion Bx taken.  R/O Endometriotic nodules.  Possible inclusion cysts. - Pathology Report  3. 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  Reassured. - medroxyPROGESTERone (DEPO-PROVERA) injection 150 mg; Inject 1 mL (150 mg total) into the muscle once.  4. Secondary dysmenorrhea Adenomyosis?  Dx and treatment reviewed.  DepoProvera started.  5. Yeast vaginitis Fluconazole 1 tab PO QD x 3 prescribed.  6. Urinary frequency U/A negative.  7. Screen for STD (sexually transmitted disease)  -  GC/Chlamydia Probe Amp  Counseling on above issues >50% x 45 minutes.  [name] MD, 12:06 PM 03/20/2017

## 2017-03-21 LAB — GC/CHLAMYDIA PROBE AMP
CT PROBE, AMP APTIMA: NOT DETECTED
GC PROBE AMP APTIMA: NOT DETECTED

## 2017-03-21 LAB — URINE CULTURE: ORGANISM ID, BACTERIA: NO GROWTH

## 2017-03-24 LAB — PATHOLOGY

## 2017-03-25 ENCOUNTER — Encounter: Payer: Self-pay | Admitting: Obstetrics & Gynecology

## 2017-03-25 ENCOUNTER — Ambulatory Visit (INDEPENDENT_AMBULATORY_CARE_PROVIDER_SITE_OTHER): Payer: Self-pay | Admitting: Obstetrics & Gynecology

## 2017-03-25 VITALS — BP 130/78 | Temp 100.0°F

## 2017-03-25 DIAGNOSIS — R1030 Lower abdominal pain, unspecified: Secondary | ICD-10-CM

## 2017-03-25 DIAGNOSIS — M79605 Pain in left leg: Principal | ICD-10-CM

## 2017-03-25 DIAGNOSIS — M545 Low back pain: Secondary | ICD-10-CM

## 2017-03-25 DIAGNOSIS — R509 Fever, unspecified: Secondary | ICD-10-CM

## 2017-03-25 LAB — CBC WITH DIFFERENTIAL/PLATELET
Basophils Absolute: 0 cells/uL (ref 0–200)
Basophils Relative: 0 %
EOS PCT: 1 %
Eosinophils Absolute: 51 cells/uL (ref 15–500)
HCT: 37.7 % (ref 35.0–45.0)
Hemoglobin: 12.2 g/dL (ref 11.7–15.5)
LYMPHS PCT: 38 %
Lymphs Abs: 1938 cells/uL (ref 850–3900)
MCH: 28.3 pg (ref 27.0–33.0)
MCHC: 32.4 g/dL (ref 32.0–36.0)
MCV: 87.5 fL (ref 80.0–100.0)
MONO ABS: 306 {cells}/uL (ref 200–950)
MONOS PCT: 6 %
MPV: 10.5 fL (ref 7.5–12.5)
Neutro Abs: 2805 cells/uL (ref 1500–7800)
Neutrophils Relative %: 55 %
PLATELETS: 218 10*3/uL (ref 140–400)
RBC: 4.31 MIL/uL (ref 3.80–5.10)
RDW: 16.8 % — AB (ref 11.0–15.0)
WBC: 5.1 10*3/uL (ref 3.8–10.8)

## 2017-03-25 NOTE — Patient Instructions (Signed)
1. Low back pain radiating to left lower extremity Probably Left Sciatalgia, bulging lumbar disc?  Refer to Ortho.    2. Lower abdominal pain Patient feels bloated.  No rectal bleeding.  Recommended to resume normal nutrition as patient had started a high fiber diet.  If no improvement, consider referral to GI.  Recent Pelvic US neg.  STI screen neg.  U. Culture neg.  Bx Vaginal neg.  3. Temperature elevated Subfebrile at 100 and then 99.8.  Patient denies URTI, UTI Sx.  No acute abdomen.  Will continue to monitor Temperature at home. - CBC w/Diff  Asencion Partridge, I will inform you of your result as soon as available.

## 2017-03-25 NOTE — Progress Notes (Signed)
    Aimee Figueroa 21-Sep-1971 [id number]        46 y.o.  [id number]   RP:  Lower back pain with irradiation to left leg with intermittent numbing in toes  HPI:  Patient cleans houses.  Report feeling a twisting pain in her lower back as she was at work.  Pain more intense since then in the lower back and radiating to left leg with intermittent numbing of left toes.  Feels bloated since changed her diet to a high fiber diet.  Recent Pelvic US neg.  STI screen neg.  U. Culture neg.  Past medical history,surgical history, problem list, medications, allergies, family history and social history were all reviewed and documented in the EPIC chart.  Directed ROS with pertinent positives and negatives documented in the history of present illness/assessment and plan.  Exam:  Vitals:   03/25/17 1516  BP: 130/78  Temp:  37.8 C (100 F)  General appearance:  Normal  Abdo:  Soft, no distension, no mass felt  Gyn exam:  Normal vulva                     Speculum:  Cervix normal.  Vaginal Bx site healed well.  Assessment/Plan:  46 y.o. [id number]   1. Low back pain radiating to left lower extremity Probably Left Sciatalgia, bulging lumbar disc?  Refer to Ortho.    2. Lower abdominal pain Patient feels bloated.  No rectal bleeding.  Recommended to resume normal nutrition as patient had started a high fiber diet.  If no improvement, consider referral to GI.  Recent Pelvic US neg.  STI screen neg.  U. Culture neg.  Bx Vaginal neg.  3. Temperature elevated Subfebrile at 100 and then 99.8.  Patient denies URTI, UTI Sx.  No acute abdomen.  Will continue to monitor Temperature at home. - CBC w/Diff  Counseling on above issues >50% x 15 minutes.  [name] MD, 3:20 PM 03/25/2017

## 2017-03-30 ENCOUNTER — Telehealth: Payer: Self-pay | Admitting: *Deleted

## 2017-03-30 NOTE — Telephone Encounter (Signed)
-----   Message from [name], MD sent at 03/25/2017  3:31 PM EDT ----- Regarding: Refer to Ortho Lower back pain with radiation to left leg and intermittent numbing in toes.

## 2017-03-30 NOTE — Telephone Encounter (Signed)
Appointment on 04/13/17 @3 :00pm with Dr, Marlou Sa pt informed, pt has no insurance will need to pay $100 for visit that day and anything after will be 55% off per scheduling pt informed.

## 2017-04-07 ENCOUNTER — Emergency Department (HOSPITAL_COMMUNITY): Payer: Self-pay

## 2017-04-07 ENCOUNTER — Encounter (HOSPITAL_COMMUNITY): Payer: Self-pay | Admitting: Vascular Surgery

## 2017-04-07 ENCOUNTER — Emergency Department (HOSPITAL_COMMUNITY)
Admission: EM | Admit: 2017-04-07 | Discharge: 2017-04-07 | Disposition: A | Discharge status: Home / Self Care | Payer: Self-pay | Attending: Emergency Medicine | Admitting: Emergency Medicine

## 2017-04-07 ENCOUNTER — Ambulatory Visit (HOSPITAL_COMMUNITY)
Admission: EM | Admit: 2017-04-07 | Discharge: 2017-04-07 | Disposition: A | Discharge status: Nursing Home (Includes ICF) | Payer: Self-pay

## 2017-04-07 DIAGNOSIS — R1032 Left lower quadrant pain: Secondary | ICD-10-CM | POA: Insufficient documentation

## 2017-04-07 DIAGNOSIS — M545 Low back pain, unspecified: Secondary | ICD-10-CM

## 2017-04-07 DIAGNOSIS — Z79899 Other long term (current) drug therapy: Secondary | ICD-10-CM | POA: Insufficient documentation

## 2017-04-07 LAB — URINALYSIS, ROUTINE W REFLEX MICROSCOPIC
Bilirubin Urine: NEGATIVE
Glucose, UA: NEGATIVE mg/dL
Hgb urine dipstick: NEGATIVE
KETONES UR: NEGATIVE mg/dL
LEUKOCYTES UA: NEGATIVE
NITRITE: NEGATIVE
PH: 8 (ref 5.0–8.0)
PROTEIN: NEGATIVE mg/dL
Specific Gravity, Urine: 1.002 — ABNORMAL LOW (ref 1.005–1.030)

## 2017-04-07 LAB — CBC
HEMATOCRIT: 38.3 % (ref 36.0–46.0)
HEMOGLOBIN: 12.7 g/dL (ref 12.0–15.0)
MCH: 29.1 pg (ref 26.0–34.0)
MCHC: 33.2 g/dL (ref 30.0–36.0)
MCV: 87.6 fL (ref 78.0–100.0)
Platelets: 193 10*3/uL (ref 150–400)
RBC: 4.37 MIL/uL (ref 3.87–5.11)
RDW: 15.6 % — AB (ref 11.5–15.5)
WBC: 4.3 10*3/uL (ref 4.0–10.5)

## 2017-04-07 LAB — COMPREHENSIVE METABOLIC PANEL
ALT: 48 U/L (ref 14–54)
ANION GAP: 8 (ref 5–15)
AST: 28 U/L (ref 15–41)
Albumin: 4.2 g/dL (ref 3.5–5.0)
Alkaline Phosphatase: 37 U/L — ABNORMAL LOW (ref 38–126)
BILIRUBIN TOTAL: 0.7 mg/dL (ref 0.3–1.2)
BUN: 5 mg/dL — AB (ref 6–20)
CHLORIDE: 106 mmol/L (ref 101–111)
CO2: 23 mmol/L (ref 22–32)
Calcium: 9.5 mg/dL (ref 8.9–10.3)
Creatinine, Ser: 0.5 mg/dL (ref 0.44–1.00)
Glucose, Bld: 96 mg/dL (ref 65–99)
POTASSIUM: 3.8 mmol/L (ref 3.5–5.1)
Sodium: 137 mmol/L (ref 135–145)
TOTAL PROTEIN: 7.6 g/dL (ref 6.5–8.1)

## 2017-04-07 LAB — I-STAT BETA HCG BLOOD, ED (MC, WL, AP ONLY)

## 2017-04-07 LAB — LIPASE, BLOOD: LIPASE: 34 U/L (ref 11–51)

## 2017-04-07 MED ORDER — IOPAMIDOL (ISOVUE-300) INJECTION 61%
INTRAVENOUS | Status: AC
  Administered 2017-04-07: 80 mL
  Filled 2017-04-07: qty 100

## 2017-04-07 MED ORDER — NAPROXEN 500 MG PO TABS
500.0000 mg | ORAL_TABLET | Freq: Two times a day (BID) | ORAL | 0 refills | Status: DC
Start: 2017-04-07 — End: 2017-05-18

## 2017-04-07 NOTE — ED Triage Notes (Signed)
Pt reports to the ED for eval of abd pain and back pain since the end of May. She states that she was seen at her doctors office and she was tx for a yeast infection and a bacterial vaginal infection. She continued to have pain and had an U/S and was told she had a fibroid. She was placed on OCPs. These medication did not help and she states that she continues to have pain. She has had a biopsy done as well and was told she may have endometriosis. Denies any vaginal bleeding at this time. She is also supposed to have an appt with ortho to have an MRI on her back.

## 2017-04-07 NOTE — ED Notes (Signed)
Pt ambulated to room from waiting room. Pt providing urine sample now.

## 2017-04-07 NOTE — ED Provider Notes (Signed)
[facility] DEPT Provider Note   CSN: [id number] Arrival date & time: 04/07/17  1325     History   Chief Complaint Chief Complaint  Patient presents with  . Abdominal Pain    HPI Aimee Figueroa is a 46 y.o. female.  Aimee Figueroa is a 46 y.o. Female who presents to the emergency department complaining of bilateral lower abdominal pain that is worse in her left lower quadrant ongoing for about a month and a half. She puts her pain began around 02/20/2017. She reports sensation of bloating and fullness around her left lower abdomen. She also reports bilateral lower back pain that worsened after she was twisting while mopping the other day. She is unable to identify alleviating or aggravating factors of her abdominal pain. Movement makes her back pain worse. She reports she saw her gynecologist who did an ultrasound of her pelvis which showed uterine fibroids. GYN suspects that she has endometriosis. Patient denies vaginal bleeding or vaginal discharge. She started on Depo-Provera. She was seen by her gynecologist last week and noted to have a temperature of 100.0. Patient denies fevers otherwise. She denies urinary symptoms. She took ibuprofen for pain control. She denies recent rapid changes to her weight. She denies loss of bowel or bladder control. She denies use of illicit IV drugs. No recent falls. She denies numbness, tingling or weakness. She denies fevers, vomiting, diarrhea, urinary symptoms, rashes, numbness, tingling, weakness, chest pain, shortness of breath. LMP was 03/10/17. She reports irregular menstrual cycles for a while now.    The history is provided by the patient and medical records. No language interpreter was used.  Abdominal Pain   Pertinent negatives include fever, diarrhea, nausea, vomiting, dysuria, frequency and headaches.    Past Medical History:  Diagnosis Date  . Abnormal Pap smear   . Depression   . Hyperlipemia   . Tuberculosis    46 yrs old    Patient  Active Problem List   Diagnosis Date Noted  . Cephalalgia 09/06/2014  . Skin depigmentation 09/06/2014  . Accessory skin tags 09/06/2014  . High triglycerides 09/06/2014  . Depression 09/06/2014  . Abnormal LFTs 09/06/2014  . AMA (advanced maternal age) multigravida 35+ 10/01/2011  . History of cesarean section 10/01/2011    Past Surgical History:  Procedure Laterality Date  . CESAREAN SECTION    . GYNECOLOGIC CRYOSURGERY      OB History    Gravida Para Term Preterm AB Living   7 5 5  0 2 5   SAB TAB Ectopic Multiple Live Births   2       5       Home Medications    Prior to Admission medications   Medication Sig Start Date End Date Taking? Authorizing Provider  Calcium-Magnesium-Vitamin D (CALCIUM 500 PO) Take by mouth.    [provider]  cholecalciferol (VITAMIN D) 1000 units tablet Take 1,000 Units by mouth daily.    [provider]  naproxen (NAPROSYN) 500 MG tablet Take 1 tablet (500 mg total) by mouth 2 (two) times daily with a meal. 04/07/17   Waynetta Pean, PA-C  norgestimate-ethinyl estradiol (SPRINTEC 28) 0.25-35 MG-MCG tablet Take 1 tablet by mouth daily. 03/12/17   Forrest Moron, MD  omega-3 acid ethyl esters (LOVAZA) 1 G capsule Take by mouth 2 (two) times daily.    [provider]    Family History Family History  Problem Relation Age of Onset  . Diabetes Father   . Hyperlipidemia Father   .  Hyperlipidemia Mother   . Cancer Paternal Aunt        uterine  . Anesthesia problems Neg Hx     Social History Social History  Substance Use Topics  . Smoking status: Never Smoker  . Smokeless tobacco: Never Used  . Alcohol use No     Allergies   Tylenol [acetaminophen]   Review of Systems Review of Systems  Constitutional: Negative for chills and fever.  HENT: Negative for congestion and sore throat.   Eyes: Negative for visual disturbance.  Respiratory: Negative for cough, shortness of breath and wheezing.     Cardiovascular: Negative for chest pain and palpitations.  Gastrointestinal: Positive for abdominal pain. Negative for diarrhea, nausea and vomiting.       Bloating   Genitourinary: Negative for difficulty urinating, dysuria, flank pain, frequency, menstrual problem, urgency, vaginal bleeding and vaginal discharge.  Musculoskeletal: Positive for back pain. Negative for gait problem and neck pain.  Skin: Negative for rash.  Neurological: Negative for dizziness, weakness, light-headedness, numbness and headaches.     Physical Exam Updated Vital Signs BP (!) 117/58   Pulse 76   Temp 98.8 F (37.1 C) (Oral)   Resp 18   LMP 03/10/2017 Comment: neg preg test  SpO2 99%   Physical Exam  Constitutional: She appears well-developed and well-nourished. No distress.  Nontoxic appearing.  HENT:  Head: Normocephalic and atraumatic.  Mouth/Throat: Oropharynx is clear and moist.  Eyes: Conjunctivae are normal. Pupils are equal, round, and reactive to light. Right eye exhibits no discharge. Left eye exhibits no discharge.  Neck: Neck supple.  Cardiovascular: Normal rate, regular rhythm, normal heart sounds and intact distal pulses.  Exam reveals no gallop and no friction rub.   No murmur heard. Pulmonary/Chest: Effort normal and breath sounds normal. No respiratory distress. She has no wheezes. She has no rales.  Lungs are clear to ascultation bilaterally. Symmetric chest expansion bilaterally. No increased work of breathing. No rales or rhonchi.    Abdominal: Soft. Bowel sounds are normal. She exhibits no distension and no mass. There is tenderness. There is no rebound and no guarding.  Abdomen is soft. Bowel sounds are present. Patient has left sided lower abdominal tenderness to palpation in suprapubic abdominal tenderness to palpation. No right lower quadrant tenderness. No peritoneal signs. No CVA or flank tenderness.  Musculoskeletal: Normal range of motion. She exhibits no edema, tenderness  or deformity.  No midline back tenderness to palpation. No back erythema, deformity or ecchymosis. Patient is spontaneously moving all extremities in a coordinated fashion exhibiting good strength.    Lymphadenopathy:    She has no cervical adenopathy.  Neurological: She is alert. She displays normal reflexes. No sensory deficit. She exhibits normal muscle tone. Coordination normal.  Normal gait. Bilateral patellar DTRs are intact. Sensation and strength is intact or bilateral lower extremities.  Skin: Skin is warm and dry. No rash noted. She is not diaphoretic. No erythema. No pallor.  Psychiatric: She has a normal mood and affect. Her behavior is normal.  Nursing note and vitals reviewed.    ED Treatments / Results  Labs (all labs ordered are listed, but only abnormal results are displayed) Labs Reviewed  COMPREHENSIVE METABOLIC PANEL - Abnormal; Notable for the following:       Result Value   BUN 5 (*)    Alkaline Phosphatase 37 (*)    All other components within normal limits  CBC - Abnormal; Notable for the following:    RDW 15.6 (*)  All other components within normal limits  URINALYSIS, ROUTINE W REFLEX MICROSCOPIC - Abnormal; Notable for the following:    Color, Urine STRAW (*)    Specific Gravity, Urine 1.002 (*)    All other components within normal limits  LIPASE, BLOOD  I-STAT BETA HCG BLOOD, ED ([facility], [facility], [facility] ONLY)    EKG  EKG Interpretation None       Radiology Ct Abdomen Pelvis W Contrast  Result Date: 04/07/2017 CLINICAL DATA:  Lower abdominal pain, left lower quadrant predominant, 1 month duration. EXAM: CT ABDOMEN AND PELVIS WITH CONTRAST TECHNIQUE: Multidetector CT imaging of the abdomen and pelvis was performed using the standard protocol following bolus administration of intravenous contrast. CONTRAST:  70mL ISOVUE-300 IOPAMIDOL (ISOVUE-300) INJECTION 61% COMPARISON:  Ultrasound 03/06/2017 FINDINGS: Lower chest: Normal Hepatobiliary: Normal Pancreas:  Normal Spleen: Normal Adrenals/Urinary Tract: Adrenal glands are normal. Kidneys are normal. No cyst, mass, stone or hydronephrosis. Bladder is normal. Stomach/Bowel: No abnormal bowel finding. Small calcified nodes in the right lower quadrant mesentery, not of clinical significance presently. Vascular/Lymphatic: Normal Reproductive: Normal by CT. Other: No free fluid or air. Musculoskeletal: Degenerative disc disease L5-S1. IMPRESSION: No cause of pain identified. Electronically Signed   By: Nelson Chimes M.D.   On: 04/07/2017 20:59    Procedures Procedures (including critical care time)  Medications Ordered in ED Medications  iopamidol (ISOVUE-300) 61 % injection (80 mLs  Contrast Given 04/07/17 2027)     Initial Impression / Assessment and Plan / ED Course  I have reviewed the triage vital signs and the nursing notes.  Pertinent labs & imaging results that were available during my care of the patient were reviewed by me and considered in my medical decision making (see chart for details).    This is a 46 y.o. Female who presents to the emergency department complaining of bilateral lower abdominal pain that is worse in her left lower quadrant ongoing for about a month and a half. She puts her pain began around 02/20/2017. She reports sensation of bloating and fullness around her left lower abdomen. She also reports bilateral lower back pain that worsened after she was twisting while mopping the other day. She is unable to identify alleviating or aggravating factors of her abdominal pain. Movement makes her back pain worse. She reports she saw her gynecologist who did an ultrasound of her pelvis which showed uterine fibroids. GYN suspects that she has endometriosis. Patient denies vaginal bleeding or vaginal discharge. She started on Depo-Provera. She denies recent rapid changes to her weight. She denies loss of bowel or bladder control. She denies use of illicit IV drugs. No recent falls. On exam  patient is afebrile and nontoxic appearing. Abdomen is soft and she has left-sided lower abdominal tenderness to palpation. No peritoneal signs. She is able to ambulatory with normal gait and has no focal neurological deficits. No midline back tenderness to palpation. Pregnancy test is negative. UA is without sign of infection. Lipase is within normal limits. CMP is unremarkable. CBC is unremarkable.  CT abdomen and pelvis was obtained which shows no cause of pain identified. Unremarkable CT abdomen and pelvis with contrast. Unsure as to the exact cause of the patient's pain. Suspect this may be related to endometriosis. I do encourage her to follow-up with GYN. Naproxen for pain control. Back pain seems to be musculoskeletal. No back pain and right flanks. We'll discharge with protrusion for naproxen and follow closely with OB/GYN and primary care doctor. Patient agrees with plan. I  advised the patient to follow-up with their primary care provider this week. I advised the patient to return to the emergency department with new or worsening symptoms or new concerns. The patient verbalized understanding and agreement with plan.      Final Clinical Impressions(s) / ED Diagnoses   Final diagnoses:  Abdominal pain, acute, left lower quadrant  Acute bilateral low back pain without sciatica    New Prescriptions New Prescriptions   NAPROXEN (NAPROSYN) 500 MG TABLET    Take 1 tablet (500 mg total) by mouth 2 (two) times daily with a meal.     Waynetta Pean, PA-C 38/33/38 [id number]    Delora Fuel, MD 91/66/06 [phone]

## 2017-04-07 NOTE — ED Notes (Signed)
Patient transported to CT 

## 2017-04-10 ENCOUNTER — Ambulatory Visit (INDEPENDENT_AMBULATORY_CARE_PROVIDER_SITE_OTHER): Payer: Self-pay | Admitting: Orthopedic Surgery

## 2017-04-10 ENCOUNTER — Encounter (INDEPENDENT_AMBULATORY_CARE_PROVIDER_SITE_OTHER): Payer: Self-pay | Admitting: Orthopedic Surgery

## 2017-04-10 ENCOUNTER — Ambulatory Visit (INDEPENDENT_AMBULATORY_CARE_PROVIDER_SITE_OTHER): Payer: Self-pay

## 2017-04-10 DIAGNOSIS — M5442 Lumbago with sciatica, left side: Secondary | ICD-10-CM

## 2017-04-10 NOTE — Addendum Note (Signed)
Addended byLaurann Montana on: 04/10/2017 09:13 AM   Modules accepted: Orders

## 2017-04-10 NOTE — Progress Notes (Signed)
Office Visit Note   Patient: Aimee Figueroa           Date of Birth: 09-03-1971           MRN: [id number] Visit Date: 04/10/2017 Requested by: Forrest Moron, MD [place], [place] [address] PCP: Forrest Moron, MD  Subjective: Chief Complaint  Patient presents with  . Lower Back - Pain    HPI: Aimee Figueroa is a 46 year old patient with 2 month history of low back and pelvic pain.  Denies a history of injury.  She cleans houses.  This all started when she was mopping and had a twisting injury.  Had immediate onset of low back pain which she thought was more associated with her menstrual cycle.  She's had a workup from her gynecologist which is been negative.  She's had a CT scan of her abdomen which was negative and blood work was negative.  Reports pain all the time.  Reports some groin pain but the pain is primarily on the left-hand side.  Doesn't describe some numbness and tingling in that leg.  Takes ibuprofen and naproxen which helps a little bit.  It does interfere with her work.  She's also had some type of biopsy and ultrasound and they're having a difficult time finding a source from a gynecologic perspective for this pain.              ROS: All systems reviewed are negative as they relate to the chief complaint within the history of present illness.  Patient denies  fevers or chills.   Assessment & Plan: Visit Diagnoses:  1. Midline low back pain with left-sided sciatica, unspecified chronicity     Plan: Impression is low back pain and left-sided sciatica.  Plan MRI L-spine to evaluate possible central and left-sided H&P.  If that is negative then we will send her back to the gynecologist for further workup.  I think it's possible she may have something going on in her back.  I'll see her back after the study.  Continue with the Naprosyn.  Follow-Up Instructions: No Follow-up on file.   Orders:  Orders Placed This Encounter  Procedures  . XR Lumbar Spine 2-3 Views    No orders of the defined types were placed in this encounter.     Procedures: No procedures performed   Clinical Data: No additional findings.  Objective: Vital Signs: There were no vitals taken for this visit.  Physical Exam:   Constitutional: Patient appears well-developed HEENT:  Head: Normocephalic Eyes:EOM are normal Neck: Normal range of motion Cardiovascular: Normal rate Pulmonary/chest: Effort normal Neurologic: Patient is alert Skin: Skin is warm Psychiatric: Patient has normal mood and affect    Ortho Exam: Orthopedic exam demonstrates normal gait and alignment.  Palpable pedal pulses.  5 out of 5 ankle dorsi and plantar flexion quite hamstring strength.  Does have L5 paresthesias on the left compared to the right.  No groin pain with internal rotation of the leg does have tenderness to palpation in the left sacroiliac joint region.  No trochanteric tenderness is noted.  Does have pain with forward lateral bending.  Specialty Comments:  No specialty comments available.  Imaging: Xr Lumbar Spine 2-3 Views  Result Date: 04/10/2017 2 views lumbar spine reviewed.  The visualized bony pelvis normal with normal sacroiliac joints and hip joints.  Spine is straight.  Pedicles normal.  No spondylolisthesis or significant degenerative disc disease except for mild degenerative disc disease at L5-S1.  No real facet arthritis is present.    PMFS History: Patient Active Problem List   Diagnosis Date Noted  . Cephalalgia 09/06/2014  . Skin depigmentation 09/06/2014  . Accessory skin tags 09/06/2014  . High triglycerides 09/06/2014  . Depression 09/06/2014  . Abnormal LFTs 09/06/2014  . AMA (advanced maternal age) multigravida 35+ 10/01/2011  . History of cesarean section 10/01/2011   Past Medical History:  Diagnosis Date  . Abnormal Pap smear   . Depression   . Hyperlipemia   . Tuberculosis    46 yrs old    Family History  Problem Relation Age of Onset  .  Diabetes Father   . Hyperlipidemia Father   . Hyperlipidemia Mother   . Cancer Paternal Aunt        uterine  . Anesthesia problems Neg Hx     Past Surgical History:  Procedure Laterality Date  . CESAREAN SECTION    . GYNECOLOGIC CRYOSURGERY     Social History   Occupational History  . Not on file.   Social History Main Topics  . Smoking status: Never Smoker  . Smokeless tobacco: Never Used  . Alcohol use No  . Drug use: No  . Sexual activity: Yes    Partners: Male    Birth control/ protection: Pill

## 2017-04-13 ENCOUNTER — Ambulatory Visit (INDEPENDENT_AMBULATORY_CARE_PROVIDER_SITE_OTHER): Payer: Self-pay | Admitting: Orthopedic Surgery

## 2017-04-14 ENCOUNTER — Encounter: Payer: Self-pay | Admitting: Family Medicine

## 2017-04-14 ENCOUNTER — Ambulatory Visit: Payer: Self-pay | Attending: Family Medicine | Admitting: Family Medicine

## 2017-04-14 VITALS — BP 113/66 | HR 72 | Temp 98.5°F | Ht 61.0 in | Wt 156.2 lb

## 2017-04-14 DIAGNOSIS — Z1212 Encounter for screening for malignant neoplasm of rectum: Secondary | ICD-10-CM

## 2017-04-14 DIAGNOSIS — M5442 Lumbago with sciatica, left side: Secondary | ICD-10-CM | POA: Insufficient documentation

## 2017-04-14 DIAGNOSIS — Z1211 Encounter for screening for malignant neoplasm of colon: Secondary | ICD-10-CM

## 2017-04-14 DIAGNOSIS — K644 Residual hemorrhoidal skin tags: Secondary | ICD-10-CM

## 2017-04-14 DIAGNOSIS — R102 Pelvic and perineal pain: Secondary | ICD-10-CM | POA: Insufficient documentation

## 2017-04-14 DIAGNOSIS — Z87898 Personal history of other specified conditions: Secondary | ICD-10-CM

## 2017-04-14 DIAGNOSIS — G8929 Other chronic pain: Secondary | ICD-10-CM | POA: Insufficient documentation

## 2017-04-14 DIAGNOSIS — K5900 Constipation, unspecified: Secondary | ICD-10-CM

## 2017-04-14 DIAGNOSIS — R198 Other specified symptoms and signs involving the digestive system and abdomen: Secondary | ICD-10-CM

## 2017-04-14 LAB — HEMOCCULT GUIAC POC 1CARD (OFFICE): Fecal Occult Blood, POC: NEGATIVE

## 2017-04-14 LAB — POCT URINALYSIS DIPSTICK
BILIRUBIN UA: NEGATIVE
Blood, UA: NEGATIVE
Glucose, UA: NEGATIVE
KETONES UA: NEGATIVE
LEUKOCYTES UA: NEGATIVE
Nitrite, UA: NEGATIVE
PH UA: 6.5 (ref 5.0–8.0)
Protein, UA: NEGATIVE
SPEC GRAV UA: 1.01 (ref 1.010–1.025)
Urobilinogen, UA: 0.2 E.U./dL

## 2017-04-14 MED ORDER — SENNOSIDES-DOCUSATE SODIUM 8.6-50 MG PO TABS: 1.0000 | ORAL_TABLET | Freq: Every evening | ORAL | Status: DC | PRN

## 2017-04-14 MED ORDER — IBUPROFEN 600 MG PO TABS: 600.0000 mg | ORAL_TABLET | Freq: Three times a day (TID) | ORAL | 0 refills | Status: DC | PRN

## 2017-04-14 MED ORDER — HYDROCORTISONE 2.5 % RE CREA: 1.0000 "application " | TOPICAL_CREAM | Freq: Two times a day (BID) | RECTAL | 0 refills | Status: DC

## 2017-04-14 MED ORDER — PSYLLIUM 28 % PO PACK: 1.0000 | PACK | Freq: Two times a day (BID) | ORAL | Status: DC | PRN

## 2017-04-14 NOTE — Patient Instructions (Addendum)
Apply for orange card.   High-Fiber Diet Fiber, also called dietary fiber, is a type of carbohydrate found in fruits, vegetables, whole grains, and beans. A high-fiber diet can have many health benefits. Your health care provider may recommend a high-fiber diet to help:  Prevent constipation. Fiber can make your bowel movements more regular.  Lower your cholesterol.  Relieve hemorrhoids, uncomplicated diverticulosis, or irritable bowel syndrome.  Prevent overeating as part of a weight-loss plan.  Prevent heart disease, type 2 diabetes, and certain cancers.  What is my plan? The recommended daily intake of fiber includes:  38 grams for men under age 29.  59 grams for men over age 46.  75 grams for women under age 28.  23 grams for women over age 62.  You can get the recommended daily intake of dietary fiber by eating a variety of fruits, vegetables, grains, and beans. Your health care provider may also recommend a fiber supplement if it is not possible to get enough fiber through your diet. What do I need to know about a high-fiber diet?  Fiber supplements have not been widely studied for their effectiveness, so it is better to get fiber through food sources.  Always check the fiber content on thenutrition facts label of any prepackaged food. Look for foods that contain at least 5 grams of fiber per serving.  Ask your dietitian if you have questions about specific foods that are related to your condition, especially if those foods are not listed in the following section.  Increase your daily fiber consumption gradually. Increasing your intake of dietary fiber too quickly may cause bloating, cramping, or gas.  Drink plenty of water. Water helps you to digest fiber. What foods can I eat? Grains Whole-grain breads. Multigrain cereal. Oats and oatmeal. Brown rice. Barley. Bulgur wheat. Blanco. Bran muffins. Popcorn. Rye wafer crackers. Vegetables Sweet potatoes. Spinach. Kale.  Artichokes. Cabbage. Broccoli. Green peas. Carrots. Squash. Fruits Berries. Pears. Apples. Oranges. Avocados. Prunes and raisins. Dried figs. Meats and Other Protein Sources Navy, kidney, pinto, and soy beans. Split peas. Lentils. Nuts and seeds. Dairy Fiber-fortified yogurt. Beverages Fiber-fortified soy milk. Fiber-fortified orange juice. Other Fiber bars. The items listed above may not be a complete list of recommended foods or beverages. Contact your dietitian for more options. What foods are not recommended? Grains White bread. Pasta made with refined flour. White rice. Vegetables Fried potatoes. Canned vegetables. Well-cooked vegetables. Fruits Fruit juice. Cooked, strained fruit. Meats and Other Protein Sources Fatty cuts of meat. Fried Sales executive or fried fish. Dairy Milk. Yogurt. Cream cheese. Sour cream. Beverages Soft drinks. Other Cakes and pastries. Butter and oils. The items listed above may not be a complete list of foods and beverages to avoid. Contact your dietitian for more information. What are some tips for including high-fiber foods in my diet?  Eat a wide variety of high-fiber foods.  Make sure that half of all grains consumed each day are whole grains.  Replace breads and cereals made from refined flour or white flour with whole-grain breads and cereals.  Replace white rice with brown rice, bulgur wheat, or millet.  Start the day with a breakfast that is high in fiber, such as a cereal that contains at least 5 grams of fiber per serving.  Use beans in place of meat in soups, salads, or pasta.  Eat high-fiber snacks, such as berries, raw vegetables, nuts, or popcorn. This information is not intended to replace advice given to you by your health care  provider. Make sure you discuss any questions you have with your health care provider. Document Released: 09/15/2005 Document Revised: 02/21/2016 Document Reviewed: 02/28/2014 Elsevier Interactive Patient  Education  2017 [employer].

## 2017-04-14 NOTE — Progress Notes (Signed)
Subjective:  Patient ID: Aimee Figueroa, female    DOB: 11/07/1970  Age: 46 y.o. MRN: [id number]  CC: Back Pain and Pelvic Pain   HPI Aimee Figueroa presents for complains of chronic low back pain.  The patient first noted symptoms 58months ago. It was not related to no known injury. The pain is rated moderate to severe, and is located at the bilateral lumbar region. The pain is described as aching and occurs intermittently. The symptoms denies been progressive. Other associated symptoms include tingling in the left leg. Previous history of symptoms: the problem is long-standing. Treatment efforts have included prescription NSAIDS, acetaminophen, xrays and orthopedic referral.  She reports upcoming MRI lumbar. History of pelvic pain, most recent ultrasound showed 2.7 cm left uterine subserosal fibroid she reports being followed by gynecology. She also reports rectal pressure that began last Friday. Denies any hematochezia, melena, or family history of colorectal cancer. History of constipation or hemorrhoids.    Outpatient Medications Prior to Visit  Medication Sig Dispense Refill  . Calcium-Magnesium-Vitamin D (CALCIUM 500 PO) Take by mouth.    . cholecalciferol (VITAMIN D) 1000 units tablet Take 1,000 Units by mouth daily.    . naproxen (NAPROSYN) 500 MG tablet Take 1 tablet (500 mg total) by mouth 2 (two) times daily with a meal. 30 tablet 0  . omega-3 acid ethyl esters (LOVAZA) 1 G capsule Take by mouth 2 (two) times daily.    . norgestimate-ethinyl estradiol (SPRINTEC 28) 0.25-35 MG-MCG tablet Take 1 tablet by mouth daily. (Patient not taking: Reported on 04/14/2017) 1 Package 11   No facility-administered medications prior to visit.     ROS Review of Systems  Constitutional: Negative.   Respiratory: Negative.   Cardiovascular: Negative.   Gastrointestinal: Positive for constipation.       Rectal fullness  Musculoskeletal: Positive for back pain.  Skin: Negative.     Psychiatric/Behavioral: Negative.    Objective:  BP 113/66   Pulse 72   Temp 98.5 F (36.9 C) (Oral)   Ht 5\' 1"  (1.549 m)   Wt 156 lb 3.2 oz (70.9 kg)   SpO2 97%   BMI 29.51 kg/m   BP/Weight 04/14/2017 04/07/2017 11/10/9415  Systolic BP 408 144 818  Diastolic BP 66 72 78  Wt. (Lbs) 156.2 - -  BMI 29.51 - -   Physical Exam  Constitutional: She appears well-developed and well-nourished.  Eyes: Pupils are equal, round, and reactive to light. Conjunctivae are normal.  Neck: No JVD present.  Cardiovascular: Normal rate, regular rhythm, normal heart sounds and intact distal pulses.   Pulmonary/Chest: Effort normal and breath sounds normal.  Abdominal: Soft. Bowel sounds are normal. There is no tenderness.  Genitourinary: Rectal exam shows external hemorrhoid. Rectal exam shows guaiac negative stool.  Skin: Skin is warm and dry.  Psychiatric: Her mood appears anxious. Her speech is rapid and/or pressured. She expresses no homicidal and no suicidal ideation. She expresses no suicidal plans and no homicidal plans.  tearful  Nursing note and vitals reviewed.   Assessment & Plan:   Problem List Items Addressed This Visit      Nervous and Auditory   Chronic left-sided low back pain with left-sided sciatica - Primary   Follow-up with orthopedics.    Relevant Medications   ibuprofen (ADVIL,MOTRIN) 600 MG tablet   Other Relevant Orders   MR Lumbar Spine Wo Contrast   Ambulatory referral to Orthopedics   POCT urinalysis dipstick (Completed)    Other Visit Diagnoses  Rectal fullness       Relevant Orders   Hemoccult - 1 Card (office) (Completed)   Ambulatory referral to Gastroenterology   Constipation, unspecified constipation type       Relevant Medications   senna-docusate (SENOKOT-S) 8.6-50 MG tablet   psyllium (METAMUCIL SMOOTH TEXTURE) 28 % packet   External hemorrhoids       Relevant Medications   hydrocortisone (ANUSOL-HC) 2.5 % rectal cream   Other Relevant Orders    Hemoccult - 1 Card (office) (Completed)   Screening for colorectal cancer       Relevant Orders   Ambulatory referral to Gastroenterology   History of abdominal pain       Relevant Medications   senna-docusate (SENOKOT-S) 8.6-50 MG tablet   psyllium (METAMUCIL SMOOTH TEXTURE) 28 % packet   Other Relevant Orders   Ambulatory referral to Gastroenterology      Meds ordered this encounter  Medications  . senna-docusate (SENOKOT-S) 8.6-50 MG tablet    Sig: Take 1 tablet by mouth at bedtime as needed for mild constipation.    Order Specific Question:   Supervising Provider    Answer:   Tresa Garter [id number]  . psyllium (METAMUCIL SMOOTH TEXTURE) 28 % packet    Sig: Take 1 packet by mouth 2 (two) times daily as needed.    Order Specific Question:   Supervising Provider    Answer:   Tresa Garter [id number]  . ibuprofen (ADVIL,MOTRIN) 600 MG tablet    Sig: Take 1 tablet (600 mg total) by mouth every 8 (eight) hours as needed for moderate pain (Take with food.).    Dispense:  30 tablet    Refill:  0    Order Specific Question:   Supervising Provider    Answer:   Tresa Garter [id number]  . hydrocortisone (ANUSOL-HC) 2.5 % rectal cream    Sig: Place 1 application rectally 2 (two) times daily. For 14 days.    Dispense:  30 g    Refill:  0    Order Specific Question:   Supervising Provider    Answer:   Tresa Garter [id number]    Follow-up: Return As needed.   Alfonse Spruce FNP

## 2017-04-16 ENCOUNTER — Encounter: Payer: Self-pay | Admitting: Gastroenterology

## 2017-04-16 ENCOUNTER — Ambulatory Visit: Payer: Self-pay | Admitting: Family Medicine

## 2017-04-17 ENCOUNTER — Telehealth: Payer: Self-pay | Admitting: General Practice

## 2017-04-17 NOTE — Telephone Encounter (Signed)
LVM for pt to call office to reschedule apmt. 8/15

## 2017-04-22 ENCOUNTER — Encounter: Payer: Self-pay | Admitting: Physician Assistant

## 2017-04-22 ENCOUNTER — Telehealth: Payer: Self-pay | Admitting: *Deleted

## 2017-04-22 ENCOUNTER — Ambulatory Visit (INDEPENDENT_AMBULATORY_CARE_PROVIDER_SITE_OTHER): Payer: Self-pay | Admitting: Obstetrics & Gynecology

## 2017-04-22 ENCOUNTER — Encounter: Payer: Self-pay | Admitting: Obstetrics & Gynecology

## 2017-04-22 VITALS — BP 128/82

## 2017-04-22 DIAGNOSIS — K6289 Other specified diseases of anus and rectum: Secondary | ICD-10-CM

## 2017-04-22 DIAGNOSIS — R140 Abdominal distension (gaseous): Secondary | ICD-10-CM

## 2017-04-22 DIAGNOSIS — M545 Low back pain, unspecified: Secondary | ICD-10-CM

## 2017-04-22 DIAGNOSIS — R102 Pelvic and perineal pain: Secondary | ICD-10-CM

## 2017-04-22 DIAGNOSIS — Z789 Other specified health status: Secondary | ICD-10-CM

## 2017-04-22 DIAGNOSIS — K589 Irritable bowel syndrome without diarrhea: Secondary | ICD-10-CM

## 2017-04-22 NOTE — Progress Notes (Signed)
    Aimee Figueroa October 01, 1970 [id number]        46 y.o.  [id number] married.  Condoms and BCPs.  RP:  Continued Left lower back and pelvic pain with rectal pain  HPI:  Continued Pelvic discomfort, but now more intense rectal pain.  No diarrhea/no constipation.  No rectal bleeding.  No fever.  On BCPs.  Past medical history,surgical history, problem list, medications, allergies, family history and social history were all reviewed and documented in the EPIC chart.  Directed ROS with pertinent positives and negatives documented in the history of present illness/assessment and plan.  Exam:  Vitals:   04/22/17 0950  BP: 128/82   General appearance:  Normal  Gyn exam:  Normal vulva.                    Bimanual exam:  Uterus AV, normal volume, mobile, NT.  No adnexal mass, NT. Rectal exam:  Tender, but no mass/lesion felt.  No gross blood.                       Assessment/Plan:  46 y.o. [id number]   1. Rectal pain Main complaint today.  No rectal bleeding.  No diarrhea/constipation.  Requests referral to Gastro-Enterology for evaluation and management.  Referral requested.  2. Midline low back pain radiating to Left Leg with occasional numbing in toes F/U Ortho as needed.  3. Female pelvic pain Negative Gyn exam.   Recent Pelvic US neg.  STI screen neg. Vaginal Bx neg (r/o Endometriosis).  U. Culture neg.  Counseling on above issues >50% x 15 minutes.  [name] MD, 9:59 AM 04/22/2017

## 2017-04-22 NOTE — Telephone Encounter (Signed)
-----   Message from [name], MD sent at 04/22/2017 10:13 AM EDT ----- Regarding: Refer to Gastro-Enterology Please refer to G-E ASAP.  IBS?  Bloating/gas/discomfort.

## 2017-04-22 NOTE — Telephone Encounter (Signed)
Referral placed at [facility] they will contact pt to schedule. 

## 2017-04-23 ENCOUNTER — Ambulatory Visit (HOSPITAL_COMMUNITY)
Admission: RE | Admit: 2017-04-23 | Discharge: 2017-04-23 | Disposition: A | Discharge status: Home / Self Care | Payer: Self-pay | Source: Ambulatory Visit | Attending: Family Medicine | Admitting: Family Medicine

## 2017-04-23 DIAGNOSIS — M5442 Lumbago with sciatica, left side: Secondary | ICD-10-CM | POA: Insufficient documentation

## 2017-04-23 DIAGNOSIS — G8929 Other chronic pain: Secondary | ICD-10-CM | POA: Insufficient documentation

## 2017-04-23 DIAGNOSIS — M5137 Other intervertebral disc degeneration, lumbosacral region: Secondary | ICD-10-CM | POA: Insufficient documentation

## 2017-04-23 NOTE — Telephone Encounter (Signed)
Appointment on 04/30/17

## 2017-04-29 NOTE — Patient Instructions (Signed)
1. Rectal pain Main complaint today.  No rectal bleeding.  No diarrhea/constipation.  Requests referral to Gastro-Enterology for evaluation and management.  Referral requested.  2. Midline low back pain radiating to Left Leg with occasional numbing in toes F/U Ortho as needed.  3. Female pelvic pain Negative Gyn exam.   Recent Pelvic US neg.  STI screen neg.  U. Culture neg.  Aimee Figueroa, good to see you today!  I requested a referral to Gastro-Enterology for you.

## 2017-04-30 ENCOUNTER — Encounter: Payer: Self-pay | Admitting: Physician Assistant

## 2017-04-30 ENCOUNTER — Ambulatory Visit (INDEPENDENT_AMBULATORY_CARE_PROVIDER_SITE_OTHER): Payer: Self-pay | Admitting: Physician Assistant

## 2017-04-30 ENCOUNTER — Telehealth (INDEPENDENT_AMBULATORY_CARE_PROVIDER_SITE_OTHER): Payer: Self-pay

## 2017-04-30 ENCOUNTER — Other Ambulatory Visit: Payer: Self-pay | Admitting: Family Medicine

## 2017-04-30 ENCOUNTER — Telehealth: Payer: Self-pay | Admitting: Family Medicine

## 2017-04-30 VITALS — BP 106/70 | HR 84 | Ht 61.5 in | Wt 147.1 lb

## 2017-04-30 DIAGNOSIS — M5136 Other intervertebral disc degeneration, lumbar region: Secondary | ICD-10-CM

## 2017-04-30 DIAGNOSIS — R102 Pelvic and perineal pain: Secondary | ICD-10-CM

## 2017-04-30 DIAGNOSIS — R195 Other fecal abnormalities: Secondary | ICD-10-CM

## 2017-04-30 DIAGNOSIS — M5137 Other intervertebral disc degeneration, lumbosacral region: Secondary | ICD-10-CM

## 2017-04-30 DIAGNOSIS — M51379 Other intervertebral disc degeneration, lumbosacral region without mention of lumbar back pain or lower extremity pain: Secondary | ICD-10-CM | POA: Insufficient documentation

## 2017-04-30 DIAGNOSIS — M48061 Spinal stenosis, lumbar region without neurogenic claudication: Secondary | ICD-10-CM

## 2017-04-30 DIAGNOSIS — M5126 Other intervertebral disc displacement, lumbar region: Secondary | ICD-10-CM

## 2017-04-30 DIAGNOSIS — R194 Change in bowel habit: Secondary | ICD-10-CM

## 2017-04-30 DIAGNOSIS — R198 Other specified symptoms and signs involving the digestive system and abdomen: Secondary | ICD-10-CM

## 2017-04-30 MED ORDER — SUPREP BOWEL PREP KIT 17.5-3.13-1.6 GM/177ML PO SOLN: ORAL | 0 refills | Status: DC

## 2017-04-30 MED ORDER — OMEPRAZOLE 20 MG PO CPDR: 20.0000 mg | DELAYED_RELEASE_CAPSULE | Freq: Every day | ORAL | 1 refills | Status: DC

## 2017-04-30 NOTE — Telephone Encounter (Signed)
Patient would like a call concerning her MRI results.  CB# is [phone].  Please advise.  Thank You

## 2017-04-30 NOTE — Patient Instructions (Signed)
You have been scheduled for a colonoscopy. Please follow written instructions given to you at your visit today.  Please pick up your prep supplies at the pharmacy within the next 1-3 days. If you use inhalers (even only as needed), please bring them with you on the day of your procedure. Your physician has requested that you go to www.startemmi.com and enter the access code given to you at your visit today. This web site gives a general overview about your procedure. However, you should still follow specific instructions given to you by our office regarding your preparation for the procedure.  We have sent the following medications to your pharmacy for you to pick up at your convenience: Prilosec  If you are age 36 or older, your body mass index should be between 23-30. Your Body mass index is 27.35 kg/m. If this is out of the aforementioned range listed, please consider follow up with your Primary Care Provider.  If you are age 45 or younger, your body mass index should be between 19-25. Your Body mass index is 27.35 kg/m. If this is out of the aformentioned range listed, please consider follow up with your Primary Care Provider.

## 2017-04-30 NOTE — Progress Notes (Signed)
Subjective:    Patient ID: Aimee Figueroa, female    DOB: [birth year], 46 y.o.   MRN: [id number]  HPI Aimee Figueroa is a pleasant 46 year old Hispanic female, referred by Dr. Chalmers Cater /GYN for evaluation of left-sided abdominal pain, pelvic pain and complaints of rectal pressure. Patient says her symptoms started at the end of May 2018. She says actually that she was monitored pain and made a twisting motion and then developed pain within a couple of hours in her lower back, which has persisted. She says within a couple of weeks she also started noticing pain radiating into her left groin, a low pelvic discomfort and a sensation of rectal pressure especially with sitting and better with standing or lying down. She has been evaluated by GYN and had a negative workup. She was also referred to orthopedics and underwent an MRI of her lumbar spine last week. That result is available and affect and shows an L5-S1 degenerative disc disease with mild bilateral neural foraminal narrowing, and mild disc bulge, no spinal canal stenosis. Patient is also had CT of the abdomen and pelvis done with an ER visit in July 2018 and that was unremarkable. Exline She has had rectal exam by her PCP which was negative. She says over the past week or so she has begun having left-sided abdominal discomfort which does not seem to be positional. Is also developed some sensation of early satiety. He has been taking naproxen on a regular basis. No nausea or vomiting. She feels that her stools been a bit narrow or in caliber over the past month as well. No melena or hematochezia. She does get some minimal relief of abdominal discomfort and rectal discomfort after a bowel movement but then pains return. Family history is negative for GI diseases far she is well. Prior surgical history -status post C-section  Labs reviewed. CBC and CMET unremarkable July 2018.  Review of Systems Pertinent positive and negative review of systems were noted in  the above HPI section.  All other review of systems was otherwise negative.  Outpatient Encounter Prescriptions as of 04/30/2017  Medication Sig  . Calcium-Magnesium-Vitamin D (CALCIUM 500 PO) Take by mouth.  . cholecalciferol (VITAMIN D) 1000 units tablet Take 1,000 Units by mouth daily.  . naproxen (NAPROSYN) 500 MG tablet Take 1 tablet (500 mg total) by mouth 2 (two) times daily with a meal.  . omega-3 acid ethyl esters (LOVAZA) 1 G capsule Take by mouth 2 (two) times daily.  . psyllium (METAMUCIL SMOOTH TEXTURE) 28 % packet Take 1 packet by mouth 2 (two) times daily as needed. (Patient taking differently: Take 1 packet by mouth as needed. )  . omeprazole (PRILOSEC) 20 MG capsule Take 1 capsule (20 mg total) by mouth daily.  Manus Gunning BOWEL PREP KIT 17.5-3.13-1.6 GM/180ML SOLN Suprep-Use as directed  . [DISCONTINUED] hydrocortisone (ANUSOL-HC) 2.5 % rectal cream Place 1 application rectally 2 (two) times daily. For 14 days.  . [DISCONTINUED] ibuprofen (ADVIL,MOTRIN) 600 MG tablet Take 1 tablet (600 mg total) by mouth every 8 (eight) hours as needed for moderate pain (Take with food.).  . [DISCONTINUED] omeprazole (PRILOSEC) 20 MG capsule Take 1 capsule (20 mg total) by mouth daily.  . [DISCONTINUED] senna-docusate (SENOKOT-S) 8.6-50 MG tablet Take 1 tablet by mouth at bedtime as needed for mild constipation.  . [DISCONTINUED] SUPREP BOWEL PREP KIT 17.5-3.13-1.6 GM/180ML SOLN Suprep-Use as directed   No facility-administered encounter medications on file as of 04/30/2017.    Allergies  Allergen  Reactions  . Tylenol [Acetaminophen] Rash   Patient Active Problem List   Diagnosis Date Noted  . Degenerative disc disease at L5-S1 level 04/30/2017  . Lumbar disc herniation 04/30/2017  . Spinal stenosis of lumbar region 04/30/2017  . Chronic left-sided low back pain with left-sided sciatica 04/14/2017  . Cephalalgia 09/06/2014  . Skin depigmentation 09/06/2014  . Accessory skin tags 09/06/2014    . High triglycerides 09/06/2014  . Depression 09/06/2014  . Abnormal LFTs 09/06/2014  . AMA (advanced maternal age) multigravida 35+ 10/01/2011  . History of cesarean section 10/01/2011   Social History   Social History  . Marital status: Married    Spouse name: N/A  . Number of children: 5  . Years of education: N/A   Occupational History  . homemaker    Social History Main Topics  . Smoking status: Never Smoker  . Smokeless tobacco: Never Used  . Alcohol use No  . Drug use: No  . Sexual activity: Yes    Partners: Male    Birth control/ protection: Pill   Other Topics Concern  . Not on file   Social History Narrative  . No narrative on file    Aimee Figueroa's family history includes Cancer in her paternal aunt; Diabetes in her father; Hyperlipidemia in her father and mother; Ulcerative colitis in her cousin.      Objective:    Vitals:   04/30/17 0832  BP: 106/70  Pulse: 84    Physical Exam  well-developed Hispanic female in no acute distress, accompanied by her mother, very pleasant blood pressure 106/70, pulse 84, height 5 foot 1, weight 147, BMI 27.3. HEENT; nontraumatic, spell EOMI PERRLA sclera anicteric, Cardiovascular ;regular rate and rhythm with S1-S2 no murmur or gallop, Pulmonary ;clear bilaterally, Abdomen ;soft, she is tender in the left mid and left lower quadrant there is no guarding or rebound no palpable mass or hepatosplenomegaly bowel sounds are present, Rectal; exam not done, this was done by her PCP and negative, Extremities; no clubbing cyanosis or edema skin warm and dry, Neuropsych ;mood and affect appropriate       Assessment & Plan:   #45 46 year old Hispanic female with 2 month history of low back pain and pain radiating into the left groin. Now with 2-3 week history of low pelvic pain and rectal pressure particularly with sitting and new development of left-sided abdominal pain. Recent CT of the abdomen and pelvis was unremarkable, and MRI  of the lumbar spine last week does show L5-S1 disc disease with mild bulge and mild bilateral foraminal narrowing. I suspect that the majority of her symptoms are secondary to L5-S1 disc disease with radicular pain We will rule out any underlying colonic pathology, occult lesion.  #2 status post C-section   Plan; with patient taking regular NSAID doses will add short term omeprazole 20 mg by mouth every morning Patient will be scheduled for colonoscopy with Dr. Silverio Decamp. Procedure discussed in detail with the patient including risks and benefits and she is agreeable to proceed. Patient was advised to contact her orthopedic surgeon regarding the MRI results and then further recommendations.   (New patient, no procedure availability on supervising physician schedule, patient will be established with Dr. Silverio Decamp)  Amy Genia Harold PA-C 04/30/2017   Cc: Orlando Penner*

## 2017-04-30 NOTE — Telephone Encounter (Signed)
Please advise on results. Thanks.

## 2017-04-30 NOTE — Telephone Encounter (Signed)
Patient would like a call concerning her MRI results.  CB# is [phone].  Please advise.  Thank You.

## 2017-05-01 ENCOUNTER — Telehealth (INDEPENDENT_AMBULATORY_CARE_PROVIDER_SITE_OTHER): Payer: Self-pay | Admitting: *Deleted

## 2017-05-01 NOTE — Telephone Encounter (Signed)
Pt called for MRI results, she didn't have a follow up appt. I made her one but she is asking if this is something that can be discussed earlier over the phone.

## 2017-05-01 NOTE — Telephone Encounter (Signed)
I called mild wear at one level

## 2017-05-01 NOTE — Progress Notes (Signed)
Reviewed and agree with documentation and assessment and plan. K. Veena Nandigam , MD   

## 2017-05-01 NOTE — Telephone Encounter (Signed)
Please advise. Thanks.  

## 2017-05-04 ENCOUNTER — Telehealth: Payer: Self-pay | Admitting: Gastroenterology

## 2017-05-04 MED ORDER — SUPREP BOWEL PREP KIT 17.5-3.13-1.6 GM/177ML PO SOLN: ORAL | 0 refills | Status: DC

## 2017-05-04 MED FILL — SUPREP BOWEL PREP KIT: 17.5-3.13-1 | 354 days supply | Qty: 354 | Fill #0

## 2017-05-04 NOTE — Telephone Encounter (Signed)
CMA call regarding results  Patient verify DOB  Patient was aware and understood  

## 2017-05-04 NOTE — Telephone Encounter (Signed)
Suprep  re sent

## 2017-05-08 ENCOUNTER — Encounter: Payer: Self-pay | Admitting: Gastroenterology

## 2017-05-08 ENCOUNTER — Ambulatory Visit (AMBULATORY_SURGERY_CENTER): Payer: Self-pay | Admitting: Gastroenterology

## 2017-05-08 VITALS — BP 106/69 | HR 71 | Temp 98.9°F | Resp 17 | Ht 61.5 in | Wt 147.0 lb

## 2017-05-08 DIAGNOSIS — D125 Benign neoplasm of sigmoid colon: Secondary | ICD-10-CM

## 2017-05-08 DIAGNOSIS — R194 Change in bowel habit: Secondary | ICD-10-CM

## 2017-05-08 MED ORDER — SODIUM CHLORIDE 0.9 % IV SOLN: 500.0000 mL | INTRAVENOUS | Status: DC

## 2017-05-08 NOTE — Op Note (Signed)
[facility] Patient Name: Aimee Figueroa Procedure Date: 05/08/2017 1:19 PM MRN: [id number] Endoscopist: Mauri Pole , MD Age: 46 Referring MD:  Date of Birth: [birth year] Gender: Female Account #: [id number] Procedure:                Colonoscopy Indications:              Change in bowel habits, Change in stool caliber Medicines:                Monitored Anesthesia Care Procedure:                Pre-Anesthesia Assessment:                           - Prior to the procedure, a History and Physical                            was performed, and patient medications and                            allergies were reviewed. The patient's tolerance of                            previous anesthesia was also reviewed. The risks                            and benefits of the procedure and the sedation                            options and risks were discussed with the patient.                            All questions were answered, and informed consent                            was obtained. Prior Anticoagulants: The patient has                            taken no previous anticoagulant or antiplatelet                            agents. ASA Grade Assessment: II - A patient with                            mild systemic disease. After reviewing the risks                            and benefits, the patient was deemed in                            satisfactory condition to undergo the procedure.                           After obtaining informed consent, the colonoscope  was passed under direct vision. Throughout the                            procedure, the patient's blood pressure, pulse, and                            oxygen saturations were monitored continuously. The                            Model CF-HQ190L [id number]) scope was introduced                            through the anus and advanced to the the cecum,                            identified  by appendiceal orifice and ileocecal                            valve. The colonoscopy was performed without                            difficulty. The patient tolerated the procedure                            well. The quality of the bowel preparation was                            excellent. The ileocecal valve, appendiceal                            orifice, and rectum were photographed. Scope In: 1:24:18 PM Scope Out: 1:40:05 PM Scope Withdrawal Time: 0 hours 12 minutes 48 seconds  Total Procedure Duration: 0 hours 15 minutes 47 seconds  Findings:                 The perianal and digital rectal examinations were                            normal.                           A 7 mm polyp was found in the sigmoid colon. The                            polyp was semi-pedunculated. The polyp was removed                            with a hot snare. Resection and retrieval were                            complete.                           A few small-mouthed diverticula were found in the  sigmoid colon and descending colon.                           Non-bleeding internal hemorrhoids were found during                            retroflexion. The hemorrhoids were small. Complications:            No immediate complications. Estimated Blood Loss:     Estimated blood loss was minimal. Impression:               - One 7 mm polyp in the sigmoid colon, removed with                            a hot snare. Resected and retrieved.                           - Diverticulosis in the sigmoid colon and in the                            descending colon.                           - Non-bleeding internal hemorrhoids. Recommendation:           - Patient has a contact number available for                            emergencies. The signs and symptoms of potential                            delayed complications were discussed with the                            patient. Return to  normal activities tomorrow.                            Written discharge instructions were provided to the                            patient.                           - Resume previous diet.                           - Continue present medications.                           - Await pathology results.                           - Repeat colonoscopy in 5-10 years for surveillance                            based on pathology results. Mauri Pole, MD 05/08/2017 1:48:24 PM This report has been signed electronically.

## 2017-05-08 NOTE — Progress Notes (Signed)
Pt's states no medical or surgical changes since previsit or office visit. 

## 2017-05-08 NOTE — Patient Instructions (Signed)
YOU HAD AN ENDOSCOPIC PROCEDURE TODAY AT THE [facility]:   Refer to the procedure report that was given to you for any specific questions about what was found during the examination.  If the procedure report does not answer your questions, please call your gastroenterologist to clarify.  If you requested that your care partner not be given the details of your procedure findings, then the procedure report has been included in a sealed envelope for you to review at your convenience later.  YOU SHOULD EXPECT: Some feelings of bloating in the abdomen. Passage of more gas than usual.  Walking can help get rid of the air that was put into your GI tract during the procedure and reduce the bloating. If you had a lower endoscopy (such as a colonoscopy or flexible sigmoidoscopy) you may notice spotting of blood in your stool or on the toilet paper. If you underwent a bowel prep for your procedure, you may not have a normal bowel movement for a few days.  Please Note:  You might notice some irritation and congestion in your nose or some drainage.  This is from the oxygen used during your procedure.  There is no need for concern and it should clear up in a day or so.  SYMPTOMS TO REPORT IMMEDIATELY:   Following lower endoscopy (colonoscopy or flexible sigmoidoscopy):  Excessive amounts of blood in the stool  Significant tenderness or worsening of abdominal pains  Swelling of the abdomen that is new, acute  Fever of 100F or higher  For urgent or emergent issues, a gastroenterologist can be reached at any hour by calling [phone].   DIET:  We do recommend a small meal at first, but then you may proceed to your regular diet.  Drink plenty of fluids but you should avoid alcoholic beverages for 24 hours.  MEDICATIONS: Continue present medications.  Please see handouts given to you by your recovery nurse.  ACTIVITY:  You should plan to take it easy for the rest of today and you should NOT  DRIVE or use heavy machinery until tomorrow (because of the sedation medicines used during the test).    FOLLOW UP: Our staff will call the number listed on your records the next business day following your procedure to check on you and address any questions or concerns that you may have regarding the information given to you following your procedure. If we do not reach you, we will leave a message.  However, if you are feeling well and you are not experiencing any problems, there is no need to return our call.  We will assume that you have returned to your regular daily activities without incident.  If any biopsies were taken you will be contacted by phone or by letter within the next 1-3 weeks.  Please call us at [phone] if you have not heard about the biopsies in 3 weeks.   Thank you for allowing us to provide for your healthcare needs today.   SIGNATURES/CONFIDENTIALITY: You and/or your care partner have signed paperwork which will be entered into your electronic medical record.  These signatures attest to the fact that that the information above on your After Visit Summary has been reviewed and is understood.  Full responsibility of the confidentiality of this discharge information lies with you and/or your care-partner. 

## 2017-05-08 NOTE — Progress Notes (Signed)
Called to room to assist during endoscopic procedure.  Patient ID and intended procedure confirmed with present staff. Received instructions for my participation in the procedure from the performing physician.  

## 2017-05-08 NOTE — Progress Notes (Signed)
To recovery, report to Hines, RN, VSS 

## 2017-05-11 ENCOUNTER — Telehealth: Payer: Self-pay

## 2017-05-11 NOTE — Telephone Encounter (Signed)
  Follow up Call-  Call back number 05/08/2017  Post procedure Call Back phone  # [phone]  Permission to leave phone message Yes  Some recent data might be hidden     Patient questions:  Do you have a fever, pain , or abdominal swelling? No. Pain Score  0 *  Have you tolerated food without any problems? Yes.    Have you been able to return to your normal activities? Yes.    Do you have any questions about your discharge instructions: Diet   No. Medications  No. Follow up visit  No.  Do you have questions or concerns about your Care? No.  Actions: * If pain score is 4 or above: No action needed, pain <4.

## 2017-05-13 ENCOUNTER — Ambulatory Visit: Payer: Self-pay | Admitting: Family Medicine

## 2017-05-13 ENCOUNTER — Ambulatory Visit (INDEPENDENT_AMBULATORY_CARE_PROVIDER_SITE_OTHER): Payer: Self-pay | Admitting: Obstetrics & Gynecology

## 2017-05-13 ENCOUNTER — Encounter: Payer: Self-pay | Admitting: Obstetrics & Gynecology

## 2017-05-13 ENCOUNTER — Telehealth: Payer: Self-pay

## 2017-05-13 VITALS — BP 122/80

## 2017-05-13 DIAGNOSIS — Z803 Family history of malignant neoplasm of breast: Secondary | ICD-10-CM

## 2017-05-13 DIAGNOSIS — N921 Excessive and frequent menstruation with irregular cycle: Secondary | ICD-10-CM

## 2017-05-13 DIAGNOSIS — Z800 Family history of malignant neoplasm of digestive organs: Secondary | ICD-10-CM

## 2017-05-13 DIAGNOSIS — R102 Pelvic and perineal pain: Secondary | ICD-10-CM

## 2017-05-13 NOTE — Telephone Encounter (Signed)
-----   Message from Trecia Rogers, [place] sent at 05/13/2017  1:40 PM EDT ----- Please inform patient of MRI results

## 2017-05-13 NOTE — Telephone Encounter (Signed)
CMA call regarding lab results   Patient verify DOB  Patient was aware and understood  

## 2017-05-13 NOTE — Progress Notes (Signed)
    Aimee Figueroa March 19, 1971 [id number]        46 y.o.  [id number] Married.  RP: Irregular vaginal bleeding with persistent Left pelvic pain  HPI:  Worsening Dysmenorrhea and Left pelvic pain x 1 year.  4th visit with me since 02/2017 for that problem.  Had attempted to control with BCPs and then received DepoProvera 1st dose on 03/20/2017.  Pelvic US c/w Adenomyosis with a small SS Myoma 2.7 cm, normal endometrium and Ovaries.  Past medical history,surgical history, problem list, medications, allergies, family history and social history were all reviewed and documented in the EPIC chart.  Directed ROS with pertinent positives and negatives documented in the history of present illness/assessment and plan.  Exam:  Vitals:   05/13/17 1510  BP: 122/80   General appearance:  Normal  Gyn exam:  Vulva normal.  Bimanual exam:  Uterus AV, normal volume, mobile, mildly tender.  No adnexal mass felt.  Nodule still felt at left vaginal fornix (Previous Vaginal Bx was neg for Endometriosis).  Assessment/Plan:  46 y.o. [id number]   1. Menometrorrhagia Decision to proceed with 2nd dose of DepoProvera today in an attempt to control the Menometrorrhagia and Pelvic pain. - medroxyPROGESTERone (DEPO-PROVERA) injection 150 mg; Inject 1 mL (150 mg total) into the muscle once.  2. Female pelvic pain Persistent, resistant pelvic pain.  Probably secondary to Adenomyosis/Endometriosis.  46 yo, no desire to preserve fertility.  Recommend Robotic TLH/Bilateral Salpingectomy.  Surgery/Risks/Benefits reviewed.  Will schedule.  F/U Preop in 2 weeks.   - medroxyPROGESTERone (DEPO-PROVERA) injection 150 mg; Inject 1 mL (150 mg total) into the muscle once.  3. Family history of colon cancer Refer for Genetic counseling given Fam h/o Breast and Colon Ca.  4. Family history of breast cancer  Counseling on above issues >50% x 25 minutes.  [name] MD, 3:24 PM 05/13/2017

## 2017-05-14 ENCOUNTER — Telehealth: Payer: Self-pay

## 2017-05-14 ENCOUNTER — Telehealth: Payer: Self-pay | Admitting: *Deleted

## 2017-05-14 ENCOUNTER — Ambulatory Visit: Payer: Self-pay | Admitting: Family Medicine

## 2017-05-14 DIAGNOSIS — Z803 Family history of malignant neoplasm of breast: Secondary | ICD-10-CM

## 2017-05-14 DIAGNOSIS — Z800 Family history of malignant neoplasm of digestive organs: Secondary | ICD-10-CM

## 2017-05-14 MED ORDER — MEDROXYPROGESTERONE ACETATE 150 MG/ML IM SUSP
150.0000 mg | Freq: Once | INTRAMUSCULAR | Status: AC
  Administered 2017-05-14: 150 mg via INTRAMUSCULAR

## 2017-05-14 NOTE — Telephone Encounter (Signed)
-----   Message from [name], MD sent at 05/13/2017  4:41 PM EDT ----- Regarding: Refer for Genetic counseling Mother Colon Cancer.  Sister Breast Ca.  Would like Genetic counseling/testing.

## 2017-05-14 NOTE — Telephone Encounter (Signed)
Contacted pt to reschedule appt pt didn't answer lvm asking pt to give [place] a call to reschedule

## 2017-05-14 NOTE — Telephone Encounter (Signed)
Referral placed they will contact pt to schedule. 

## 2017-05-15 ENCOUNTER — Telehealth: Payer: Self-pay | Admitting: Gastroenterology

## 2017-05-15 NOTE — Telephone Encounter (Signed)
Left message to call back regarding abdominal discomfort. Left message to call PCP asap about her SOB.

## 2017-05-15 NOTE — Telephone Encounter (Signed)
Spoke with the patient. Her abdominal pain is upper and "to my left." Burping. Worsens pain with food. No change in her pain with deep breathes.  Appointment scheduled.

## 2017-05-15 NOTE — Patient Instructions (Signed)
1. Menometrorrhagia Decision to proceed with 2nd dose of DepoProvera today in an attempt to control the Menometrorrhagia and Pelvic pain. - medroxyPROGESTERone (DEPO-PROVERA) injection 150 mg; Inject 1 mL (150 mg total) into the muscle once.  2. Female pelvic pain Persistent, resistant pelvic pain.  Probably secondary to Adenomyosis/Endometriosis.  46 yo, no desire to preserve fertility.  Recommend Robotic TLH/Bilateral Salpingectomy.  Surgery/Risks/Benefits reviewed.  Will schedule.  F/U Preop in 2 weeks.   - medroxyPROGESTERone (DEPO-PROVERA) injection 150 mg; Inject 1 mL (150 mg total) into the muscle once.  3. Family history of colon cancer Refer for Genetic counseling given Fam h/o Breast and Colon Ca.  4. Family history of breast cancer  Aimee Figueroa, it was a pleasure to see you today!  See you soon for the Preop visit.

## 2017-05-18 ENCOUNTER — Encounter: Payer: Self-pay | Admitting: Family Medicine

## 2017-05-18 ENCOUNTER — Other Ambulatory Visit: Payer: Self-pay | Admitting: Family Medicine

## 2017-05-18 ENCOUNTER — Ambulatory Visit (HOSPITAL_COMMUNITY)
Admission: RE | Admit: 2017-05-18 | Discharge: 2017-05-18 | Disposition: A | Discharge status: Home / Self Care | Payer: Self-pay | Source: Ambulatory Visit | Attending: Family Medicine | Admitting: Family Medicine

## 2017-05-18 ENCOUNTER — Encounter: Payer: Self-pay | Admitting: Gastroenterology

## 2017-05-18 ENCOUNTER — Ambulatory Visit: Payer: Self-pay | Admitting: Licensed Clinical Social Worker

## 2017-05-18 ENCOUNTER — Ambulatory Visit: Payer: Self-pay | Attending: Family Medicine | Admitting: Family Medicine

## 2017-05-18 VITALS — BP 106/67 | HR 71 | Temp 99.1°F | Resp 18 | Ht 62.0 in | Wt 150.0 lb

## 2017-05-18 DIAGNOSIS — F419 Anxiety disorder, unspecified: Secondary | ICD-10-CM

## 2017-05-18 DIAGNOSIS — G8929 Other chronic pain: Secondary | ICD-10-CM | POA: Insufficient documentation

## 2017-05-18 DIAGNOSIS — R1013 Epigastric pain: Secondary | ICD-10-CM

## 2017-05-18 DIAGNOSIS — R1030 Lower abdominal pain, unspecified: Secondary | ICD-10-CM | POA: Insufficient documentation

## 2017-05-18 DIAGNOSIS — R109 Unspecified abdominal pain: Secondary | ICD-10-CM | POA: Insufficient documentation

## 2017-05-18 DIAGNOSIS — M5136 Other intervertebral disc degeneration, lumbar region: Secondary | ICD-10-CM

## 2017-05-18 DIAGNOSIS — M5442 Lumbago with sciatica, left side: Secondary | ICD-10-CM | POA: Insufficient documentation

## 2017-05-18 DIAGNOSIS — K219 Gastro-esophageal reflux disease without esophagitis: Secondary | ICD-10-CM | POA: Insufficient documentation

## 2017-05-18 DIAGNOSIS — M5137 Other intervertebral disc degeneration, lumbosacral region: Secondary | ICD-10-CM | POA: Insufficient documentation

## 2017-05-18 DIAGNOSIS — M5126 Other intervertebral disc displacement, lumbar region: Secondary | ICD-10-CM | POA: Insufficient documentation

## 2017-05-18 LAB — POCT URINALYSIS DIPSTICK
BILIRUBIN UA: NEGATIVE
GLUCOSE UA: NEGATIVE
KETONES UA: NEGATIVE
Leukocytes, UA: NEGATIVE
NITRITE UA: NEGATIVE
Protein, UA: NEGATIVE
Spec Grav, UA: 1.01 (ref 1.010–1.025)
Urobilinogen, UA: 0.2 E.U./dL
pH, UA: 7 (ref 5.0–8.0)

## 2017-05-18 MED ORDER — OMEPRAZOLE 20 MG PO CPDR: 20.0000 mg | DELAYED_RELEASE_CAPSULE | Freq: Every day | ORAL | 2 refills | Status: DC

## 2017-05-18 MED ORDER — IBUPROFEN 600 MG PO TABS: 600.0000 mg | ORAL_TABLET | Freq: Three times a day (TID) | ORAL | 0 refills | Status: DC | PRN

## 2017-05-18 MED ORDER — FLUOXETINE HCL 20 MG PO TABS: 20.0000 mg | ORAL_TABLET | Freq: Every day | ORAL | 2 refills | Status: DC

## 2017-05-18 MED FILL — FLUoxetine HCL 20 MG CAPS: 20 | 30 days supply | Qty: 30 | Fill #0

## 2017-05-18 MED FILL — IBUPROFEN 600 MG TABLET: 600 | 10 days supply | Qty: 30 | Fill #0

## 2017-05-18 NOTE — Patient Instructions (Addendum)
Schedule appointment with LCSW in 4 weeks. Schedule appointment with PCP in 8 weeks.   Fluoxetine capsules or tablets (Depression/Mood Disorders) What is this medicine? FLUOXETINE (floo OX e teen) belongs to a class of drugs known as selective serotonin reuptake inhibitors (SSRIs). It helps to treat mood problems such as depression, obsessive compulsive disorder, and panic attacks. It can also treat certain eating disorders. This medicine may be used for other purposes; ask your health care provider or pharmacist if you have questions. COMMON BRAND NAME(S): Prozac What should I tell my health care provider before I take this medicine? They need to know if you have any of these conditions: -bipolar disorder or a family history of bipolar disorder -bleeding disorders -glaucoma -heart disease -liver disease -low levels of sodium in the blood -seizures -suicidal thoughts, plans, or attempt; a previous suicide attempt by you or a family member -take MAOIs like Carbex, Eldepryl, Marplan, Nardil, and Parnate -take medicines that treat or prevent blood clots -thyroid disease -an unusual or allergic reaction to fluoxetine, other medicines, foods, dyes, or preservatives -pregnant or trying to get pregnant -breast-feeding How should I use this medicine? Take this medicine by mouth with a glass of water. Follow the directions on the prescription label. You can take this medicine with or without food. Take your medicine at regular intervals. Do not take it more often than directed. Do not stop taking this medicine suddenly except upon the advice of your doctor. Stopping this medicine too quickly may cause serious side effects or your condition may worsen. A special MedGuide will be given to you by the pharmacist with each prescription and refill. Be sure to read this information carefully each time. Talk to your pediatrician regarding the use of this medicine in children. While this drug may be  prescribed for children as young as 7 years for selected conditions, precautions do apply. Overdosage: If you think you have taken too much of this medicine contact a poison control center or emergency room at once. NOTE: This medicine is only for you. Do not share this medicine with others. What if I miss a dose? If you miss a dose, skip the missed dose and go back to your regular dosing schedule. Do not take double or extra doses. What may interact with this medicine? Do not take this medicine with any of the following medications: -other medicines containing fluoxetine, like Sarafem or Symbyax -cisapride -linezolid -MAOIs like Carbex, Eldepryl, Marplan, Nardil, and Parnate -methylene blue (injected into a vein) -pimozide -thioridazine This medicine may also interact with the following medications: -alcohol -amphetamines -aspirin and aspirin-like medicines -carbamazepine -certain medicines for depression, anxiety, or psychotic disturbances -certain medicines for migraine headaches like almotriptan, eletriptan, frovatriptan, naratriptan, rizatriptan, sumatriptan, zolmitriptan -digoxin -diuretics -fentanyl -flecainide -furazolidone -isoniazid -lithium -medicines for sleep -medicines that treat or prevent blood clots like warfarin, enoxaparin, and dalteparin -NSAIDs, medicines for pain and inflammation, like ibuprofen or naproxen -phenytoin -procarbazine -propafenone -rasagiline -ritonavir -supplements like St. John's wort, kava kava, valerian -tramadol -tryptophan -vinblastine This list may not describe all possible interactions. Give your health care provider a list of all the medicines, herbs, non-prescription drugs, or dietary supplements you use. Also tell them if you smoke, drink alcohol, or use illegal drugs. Some items may interact with your medicine. What should I watch for while using this medicine? Tell your doctor if your symptoms do not get better or if they get  worse. Visit your doctor or health care professional for regular checks on your  progress. Because it may take several weeks to see the full effects of this medicine, it is important to continue your treatment as prescribed by your doctor. Patients and their families should watch out for new or worsening thoughts of suicide or depression. Also watch out for sudden changes in feelings such as feeling anxious, agitated, panicky, irritable, hostile, aggressive, impulsive, severely restless, overly excited and hyperactive, or not being able to sleep. If this happens, especially at the beginning of treatment or after a change in dose, call your health care professional. Aimee Figueroa may get drowsy or dizzy. Do not drive, use machinery, or do anything that needs mental alertness until you know how this medicine affects you. Do not stand or sit up quickly, especially if you are an older patient. This reduces the risk of dizzy or fainting spells. Alcohol may interfere with the effect of this medicine. Avoid alcoholic drinks. Your mouth may get dry. Chewing sugarless gum or sucking hard candy, and drinking plenty of water may help. Contact your doctor if the problem does not go away or is severe. This medicine may affect blood sugar levels. If you have diabetes, check with your doctor or health care professional before you change your diet or the dose of your diabetic medicine. What side effects may I notice from receiving this medicine? Side effects that you should report to your doctor or health care professional as soon as possible: -allergic reactions like skin rash, itching or hives, swelling of the face, lips, or tongue -anxious -black, tarry stools -breathing problems -changes in vision -confusion -elevated mood, decreased need for sleep, racing thoughts, impulsive behavior -eye pain -fast, irregular heartbeat -feeling faint or lightheaded, falls -feeling agitated, angry, or irritable -hallucination, loss of  contact with reality -loss of balance or coordination -loss of memory -painful or prolonged erections -restlessness, pacing, inability to keep still -seizures -stiff muscles -suicidal thoughts or other mood changes -trouble sleeping -unusual bleeding or bruising -unusually weak or tired -vomiting Side effects that usually do not require medical attention (report to your doctor or health care professional if they continue or are bothersome): -change in appetite or weight -change in sex drive or performance -diarrhea -dry mouth -headache -increased sweating -nausea -tremors This list may not describe all possible side effects. Call your doctor for medical advice about side effects. You may report side effects to FDA at 1-800-FDA-1088. Where should I keep my medicine? Keep out of the reach of children. Store at room temperature between 15 and 30 degrees C (59 and 86 degrees F). Throw away any unused medicine after the expiration date. NOTE: This sheet is a summary. It may not cover all possible information. If you have questions about this medicine, talk to your doctor, pharmacist, or health care provider.  2018 Elsevier/Gold Standard (2016-02-16 15:55:27) Food Choices for Gastroesophageal Reflux Disease, Adult When you have gastroesophageal reflux disease (GERD), the foods you eat and your eating habits are very important. Choosing the right foods can help ease your discomfort. What guidelines do I need to follow?  Choose fruits, vegetables, whole grains, and low-fat dairy products.  Choose low-fat meat, fish, and poultry.  Limit fats such as oils, salad dressings, butter, nuts, and avocado.  Keep a food diary. This helps you identify foods that cause symptoms.  Avoid foods that cause symptoms. These may be different for everyone.  Eat small meals often instead of 3 large meals a day.  Eat your meals slowly, in a place where you are relaxed.  Limit fried foods.  Cook  foods using methods other than frying.  Avoid drinking alcohol.  Avoid drinking large amounts of liquids with your meals.  Avoid bending over or lying down until 2-3 hours after eating. What foods are not recommended? These are some foods and drinks that may make your symptoms worse: Vegetables Tomatoes. Tomato juice. Tomato and spaghetti sauce. Chili peppers. Onion and garlic. Horseradish. Fruits Oranges, grapefruit, and lemon (fruit and juice). Meats High-fat meats, fish, and poultry. This includes hot dogs, ribs, ham, sausage, salami, and bacon. Dairy Whole milk and chocolate milk. Sour cream. Cream. Butter. Ice cream. Cream cheese. Drinks Coffee and tea. Bubbly (carbonated) drinks or energy drinks. Condiments Hot sauce. Barbecue sauce. Sweets/Desserts Chocolate and cocoa. Donuts. Peppermint and spearmint. Fats and Oils High-fat foods. This includes [place] fries and potato chips. Other Vinegar. Strong spices. This includes black pepper, white pepper, red pepper, cayenne, curry powder, cloves, ginger, and chili powder. The items listed above may not be a complete list of foods and drinks to avoid. Contact your dietitian for more information. This information is not intended to replace advice given to you by your health care provider. Make sure you discuss any questions you have with your health care provider. Document Released: 03/16/2012 Document Revised: 02/21/2016 Document Reviewed: 07/20/2013 Elsevier Interactive Patient Education  2017 [place]. Abdominal Pain, Adult Many things can cause belly (abdominal) pain. Most times, belly pain is not dangerous. Many cases of belly pain can be watched and treated at home. Sometimes belly pain is serious, though. Your doctor will try to find the cause of your belly pain. Follow these instructions at home:  Take over-the-counter and prescription medicines only as told by your doctor. Do not take medicines that help you poop  (laxatives) unless told to by your doctor.  Drink enough fluid to keep your pee (urine) clear or pale yellow.  Watch your belly pain for any changes.  Keep all follow-up visits as told by your doctor. This is important. Contact a doctor if:  Your belly pain changes or gets worse.  You are not hungry, or you lose weight without trying.  You are having trouble pooping (constipated) or have watery poop (diarrhea) for more than 2-3 days.  You have pain when you pee or poop.  Your belly pain wakes you up at night.  Your pain gets worse with meals, after eating, or with certain foods.  You are throwing up and cannot keep anything down.  You have a fever. Get help right away if:  Your pain does not go away as soon as your doctor says it should.  You cannot stop throwing up.  Your pain is only in areas of your belly, such as the right side or the left lower part of the belly.  You have bloody or black poop, or poop that looks like tar.  You have very bad pain, cramping, or bloating in your belly.  You have signs of not having enough fluid or water in your body (dehydration), such as: ? Dark pee, very little pee, or no pee. ? Cracked lips. ? Dry mouth. ? Sunken eyes. ? Sleepiness. ? Weakness. This information is not intended to replace advice given to you by your health care provider. Make sure you discuss any questions you have with your health care provider. Document Released: 03/03/2008 Document Revised: 04/04/2016 Document Reviewed: 02/27/2016 Elsevier Interactive Patient Education  2017 [employer].

## 2017-05-18 NOTE — Progress Notes (Signed)
Subjective:  Patient ID: Aimee Figueroa, female    DOB: [birth year]  Age: 46 y.o. MRN: [id number]  CC: Back Pain and Abdominal Pain   HPI Aimee Figueroa presents for complains of abdominal pain. The pain is described as aching, and is moderate in  intensity. Pain is located in the epigastric, lower quadrants with radiation to lower back. Onset was 1 month ago. Symptoms have been unchanged since. Aggravating factors: eating food quickly  Alleviating factors: none. Associated symptoms: belching. The patient denies constipation, hematochezia, melena, nausea and vomiting. Recent history of colonoscopy which showed 7 mm sigmoid polyp. History of lower abdominal pain with menorrhagia. She is being followed by gynecology who recommended procedure, which is scheduled. MRI lumbar spine done 04/23/17 which showed DDD of L5-S1 and disc desiccation. of  History of anxious mood.  She has the following symptoms: difficulty concentrating, feelings of losing control, racing thoughts. Onset of symptoms was approximately several years ago, unchanged since that time. She denies current suicidal and homicidal ideation. Possible organic causes contributing are: none. Risk factors: previous episode of depression Previous treatment includes nortriptyline which she reports taking 2 years ago, with adequate relief of symptoms. She is agreeable to speaking with LCSW today.    Outpatient Medications Prior to Visit  Medication Sig Dispense Refill  . naproxen (NAPROSYN) 500 MG tablet Take 1 tablet (500 mg total) by mouth 2 (two) times daily with a meal. 30 tablet 0  . Calcium-Magnesium-Vitamin D (CALCIUM 500 PO) Take by mouth.    . cholecalciferol (VITAMIN D) 1000 units tablet Take 1,000 Units by mouth daily.    Marland Kitchen omega-3 acid ethyl esters (LOVAZA) 1 G capsule Take by mouth 2 (two) times daily.     Facility-Administered Medications Prior to Visit  Medication Dose Route Frequency Provider Last Rate Last Dose  . 0.9 %  sodium  chloride infusion  500 mL Intravenous Continuous Nandigam, Venia Minks, MD        ROS Review of Systems  Constitutional: Negative.   HENT: Negative.   Eyes: Negative.   Respiratory: Negative.   Cardiovascular: Negative.   Gastrointestinal: Positive for abdominal pain.  Genitourinary: Negative.   Musculoskeletal: Positive for back pain.  Neurological: Negative.   Psychiatric/Behavioral: Negative for suicidal ideas. The patient is nervous/anxious.        Objective:  BP 106/67 (BP Location: Left Arm, Patient Position: Sitting, Cuff Size: Normal)   Pulse 71   Temp 99.1 F (37.3 C) (Oral)   Resp 18   Ht 5' 2"  (1.575 m)   Wt 150 lb (68 kg)   LMP 05/01/2017   SpO2 100%   BMI 27.44 kg/m   BP/Weight 05/18/2017 05/13/2017 6/96/7893  Systolic BP 810 175 102  Diastolic BP 67 80 69  Wt. (Lbs) 150 - 147  BMI 27.44 - 27.33    Physical Exam  Constitutional: She appears well-developed and well-nourished.  HENT:  Head: Normocephalic and atraumatic.  Right Ear: External ear normal.  Left Ear: External ear normal.  Nose: Nose normal.  Mouth/Throat: Oropharynx is clear and moist.  Eyes: Pupils are equal, round, and reactive to light. Conjunctivae are normal.  Neck: Normal range of motion. Neck supple.  Cardiovascular: Normal rate, regular rhythm, normal heart sounds and intact distal pulses.   Pulmonary/Chest: Effort normal and breath sounds normal.  Abdominal: Soft. Bowel sounds are normal. There is tenderness (lower quadrants/ epigastric).  Lymphadenopathy:    She has no cervical adenopathy.  Skin: Skin is warm and dry.  Psychiatric: Her mood appears anxious. Her speech is tangential. She expresses no homicidal and no suicidal ideation. She expresses no suicidal plans and no homicidal plans.  Nursing note and vitals reviewed.  Assessment & Plan:   Problem List Items Addressed This Visit      Nervous and Auditory   Chronic left-sided low back pain with left-sided sciatica    Relevant Medications   ibuprofen (ADVIL,MOTRIN) 600 MG tablet     Musculoskeletal and Integument   Degenerative disc disease at L5-S1 level   Relevant Medications   ibuprofen (ADVIL,MOTRIN) 600 MG tablet   Other Relevant Orders   Ambulatory referral to Physical Therapy   Ambulatory referral to Orthopedics   Lumbar disc herniation   Relevant Orders   Ambulatory referral to Physical Therapy   Ambulatory referral to Orthopedics    Other Visit Diagnoses    Lower abdominal pain    -  Primary   Chronic abdominal pain, patient is appears very anxious. Will obtain imaging and labs to rule out causes.   Recommend following up with gynecologist recommendations.   Relevant Medications   ibuprofen (ADVIL,MOTRIN) 600 MG tablet   Other Relevant Orders   Urinalysis Dipstick (Completed)   CMP14+EGFR   CBC with Differential   DG Abd 1 View   Lipid Panel   Lipase   Epigastric abdominal pain       Relevant Orders   H. pylori breath test   Anxious mood       LCSW spoke with patient and provided resources   Follow up with LCSW in 4 weeks.   Follow up with PCP in 8 weeks.   Relevant Medications   FLUoxetine (PROZAC) 20 MG tablet   Gastroesophageal reflux disease without esophagitis       Will order PPI to help with symptoms.   Relevant Orders   H. pylori breath test      Meds ordered this encounter  Medications  . ibuprofen (ADVIL,MOTRIN) 600 MG tablet    Sig: Take 1 tablet (600 mg total) by mouth every 8 (eight) hours as needed.    Dispense:  30 tablet    Refill:  0    Order Specific Question:   Supervising Provider    Answer:   Tresa Garter [id number]  . FLUoxetine (PROZAC) 20 MG tablet    Sig: Take 1 tablet (20 mg total) by mouth daily.    Dispense:  30 tablet    Refill:  2    Order Specific Question:   Supervising Provider    Answer:   Tresa Garter [id number]    Follow-up: Return in about 4 weeks (around 06/15/2017), or if symptoms worsen or fail to improve, for  [facility].   Alfonse Spruce FNP

## 2017-05-18 NOTE — Progress Notes (Signed)
`  Patient is here for lower abdomen pain & back pain constantly

## 2017-05-18 NOTE — BH Specialist Note (Signed)
Integrated Behavioral Health Initial Visit  MRN: [id number] Name: Aimee Figueroa   Session Start time: 10:45 AM Session End time: 11:15 AM Total time: 30 minutes  Type of Service: [place] Interpretor:No. Interpretor Name and Language: N.A   Warm Hand Off Completed.       SUBJECTIVE: Aimee Figueroa is a 46 y.o. female accompanied by patient. Patient was referred by FNP [place] for anxiety and depression. 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 She has an adult child who is out of the home. Pt shared that she is unable to talk to spouse about mental health because "he doesn't understand" School/Work: Pt's spouse is employed. She stays home with the children. The family does not receive any public benefits. Pt has an orange card. Self-Care: Pt is open to medication management. She attends church, prays, and reads the bible to cope with stressors. Life Changes: Pt is experiencing increased abdominal pain since May 2018. She is concerned about having cancer; however, reports specialists have not indicated any concerns.  GOALS ADDRESSED: Patient will reduce symptoms of: anxiety and depression and increase knowledge and/or ability of: coping skills and also: Increase adequate support systems for patient/family   INTERVENTIONS: Solution-Focused Strategies, Supportive Counseling, Psychoeducation and/or Health Education and Link to [employer]  Standardized Assessments completed: GAD-7 and PHQ 2&9  ASSESSMENT: Patient  currently experiencing depression and anxiety triggered by chronic lower abdomen pain. She reports feelings of sadness, guilt about parenting skills, and low energy. She receives limited emotional support from spouse. Patient may benefit from psychoeducation, psychotherapy, and medication management. LCSWA educated pt on the correlation between one's mental and physical health. LCSWA discussed benefits of applying healthy coping skills to decrease symptoms. Pt successfully identified healthy strategies to implement on a routine basis. She is open to participating in medication management through PCP. LCSWA provided pt with community resources for crisis intervention and psychotherapy.   PLAN: 1. Follow up with behavioral health clinician on : Pt was encouraged to contact LCSWA if symptoms worsen or fail to improve to schedule behavioral appointments at [facility]. 2. Behavioral recommendations: LCSWA recommends that pt apply healthy coping skills discussed, comply with medication management, and utilize provided resources. Pt is encouraged to schedule follow up appointment with LCSWA 3. Referral(s): [facility] (LME/Outside Clinic) 4. "From scale of 1-10, how likely are you to follow plan?": 9/10  Rebekah Chesterfield, LCSW 05/21/17 9:06 AM

## 2017-05-19 ENCOUNTER — Ambulatory Visit (INDEPENDENT_AMBULATORY_CARE_PROVIDER_SITE_OTHER): Payer: Self-pay | Admitting: Physician Assistant

## 2017-05-19 ENCOUNTER — Ambulatory Visit (INDEPENDENT_AMBULATORY_CARE_PROVIDER_SITE_OTHER): Payer: Self-pay | Admitting: Women's Health

## 2017-05-19 ENCOUNTER — Encounter: Payer: Self-pay | Admitting: Women's Health

## 2017-05-19 ENCOUNTER — Encounter: Payer: Self-pay | Admitting: Physician Assistant

## 2017-05-19 VITALS — BP 110/70 | Ht 62.0 in | Wt 150.0 lb

## 2017-05-19 DIAGNOSIS — K648 Other hemorrhoids: Secondary | ICD-10-CM

## 2017-05-19 DIAGNOSIS — R1030 Lower abdominal pain, unspecified: Secondary | ICD-10-CM

## 2017-05-19 DIAGNOSIS — K219 Gastro-esophageal reflux disease without esophagitis: Secondary | ICD-10-CM

## 2017-05-19 DIAGNOSIS — K5730 Diverticulosis of large intestine without perforation or abscess without bleeding: Secondary | ICD-10-CM

## 2017-05-19 LAB — CBC WITH DIFFERENTIAL/PLATELET
BASOS ABS: 0 10*3/uL (ref 0.0–0.2)
Basos: 1 %
EOS (ABSOLUTE): 0.1 10*3/uL (ref 0.0–0.4)
EOS: 1 %
HEMATOCRIT: 40.8 % (ref 34.0–46.6)
HEMOGLOBIN: 13.7 g/dL (ref 11.1–15.9)
IMMATURE GRANS (ABS): 0 10*3/uL (ref 0.0–0.1)
Immature Granulocytes: 0 %
LYMPHS ABS: 1.6 10*3/uL (ref 0.7–3.1)
LYMPHS: 37 %
MCH: 29.7 pg (ref 26.6–33.0)
MCHC: 33.6 g/dL (ref 31.5–35.7)
MCV: 89 fL (ref 79–97)
MONOCYTES: 6 %
Monocytes Absolute: 0.3 10*3/uL (ref 0.1–0.9)
NEUTROS ABS: 2.4 10*3/uL (ref 1.4–7.0)
Neutrophils: 55 %
Platelets: 168 10*3/uL (ref 150–379)
RBC: 4.61 x10E6/uL (ref 3.77–5.28)
RDW: 18.8 % — ABNORMAL HIGH (ref 12.3–15.4)
WBC: 4.3 10*3/uL (ref 3.4–10.8)

## 2017-05-19 LAB — CMP14+EGFR
ALBUMIN: 4.7 g/dL (ref 3.5–5.5)
ALT: 40 IU/L — AB (ref 0–32)
AST: 26 IU/L (ref 0–40)
Albumin/Globulin Ratio: 1.7 (ref 1.2–2.2)
Alkaline Phosphatase: 50 IU/L (ref 39–117)
BUN / CREAT RATIO: 11 (ref 9–23)
BUN: 6 mg/dL (ref 6–24)
Bilirubin Total: 0.4 mg/dL (ref 0.0–1.2)
CALCIUM: 10.2 mg/dL (ref 8.7–10.2)
CO2: 23 mmol/L (ref 20–29)
CREATININE: 0.53 mg/dL — AB (ref 0.57–1.00)
Chloride: 104 mmol/L (ref 96–106)
GFR, EST AFRICAN AMERICAN: 133 mL/min/{1.73_m2} (ref >59)
GFR, EST NON AFRICAN AMERICAN: 115 mL/min/{1.73_m2} (ref >59)
GLUCOSE: 108 mg/dL — AB (ref 65–99)
Globulin, Total: 2.7 g/dL (ref 1.5–4.5)
Potassium: 4 mmol/L (ref 3.5–5.2)
Sodium: 142 mmol/L (ref 134–144)
TOTAL PROTEIN: 7.4 g/dL (ref 6.0–8.5)

## 2017-05-19 LAB — LIPID PANEL
CHOL/HDL RATIO: 4 ratio (ref 0.0–4.4)
CHOLESTEROL TOTAL: 147 mg/dL (ref 100–199)
HDL: 37 mg/dL — AB (ref >39)
LDL CALC: 43 mg/dL (ref 0–99)
TRIGLYCERIDES: 336 mg/dL — AB (ref 0–149)
VLDL Cholesterol Cal: 67 mg/dL — ABNORMAL HIGH (ref 5–40)

## 2017-05-19 LAB — H. PYLORI BREATH TEST: H pylori Breath Test: POSITIVE — AB

## 2017-05-19 LAB — LIPASE: LIPASE: 48 U/L (ref 14–72)

## 2017-05-19 MED ORDER — LORAZEPAM 0.5 MG PO TABS: ORAL_TABLET | ORAL | 0 refills | Status: DC

## 2017-05-19 MED ORDER — OMEPRAZOLE 20 MG PO CPDR: DELAYED_RELEASE_CAPSULE | ORAL | 3 refills | Status: DC

## 2017-05-19 NOTE — Progress Notes (Signed)
Presents with acute persistent left lower abdominal pain, dysmenorrhea which has been increasing over the past 6 months. Has had numerous office visits due to this low abdominal pain by orthopedist, internal medicine (05/18/17), gastroenterologist, Dr. Dellis Filbert and was seen in the ER 04/07/2017. 04/23/2017 MRI of lumbar spine showed degenerative disc disease L5 to S1. History of anxiety. Was seen by Dr. Dellis Filbert 4 times since June. Pain had been attempted to be controled with birth control pills, Depo-Provera first 03/20/2017, second 05/13/2017. Pelvic ultrasound consistent with adenomyosis and a small fibroid 2.7 mm. Robotic TL H bilateral salpingectomy planned, (not scheduled at this time).  46 year old MHF  G7 P5 . Caring for 4 children at home, youngest 12, oldest daughter is 87 at [facility]. Reports minimal bleeding today, but has had menstrual cramping.  Exam: Tearful states is worried that she may have cancer, friend from church age 66 recently died from uterine cancer.  Abdominal pain/dysmenorrhea/adenomyosis pending TL H BSO Anxiety  Plan: We'll call with available dates for surgery, reassurance given, reviewed nothing appears to be cancer related at this time. Questions answered regarding surgery.

## 2017-05-19 NOTE — Patient Instructions (Addendum)
Continue Prilosec 20 mg, take 1 tab by mouth  Every morning. We sent a prescription to [place].  Minimize Naproxen.  We have given you a presecription to take to [facility] .

## 2017-05-20 NOTE — Progress Notes (Signed)
Subjective:    Patient ID: Aimee Figueroa, female    DOB: 10/06/1970, 46 y.o.   MRN: [id number]  HPI Aimee Figueroa is a pleasant 46 year old Hispanic female, recently established with Dr. Homero Fellers known to myself from previous visits. She comes in today with ongoing complaints of abdominal pain. She was initially seen on 04/30/2017 with complaints of 3 month history of primarily left-sided abdominal pain pelvic pain and rectal pressure. She had undergone CT of the abdomen and pelvis which was negative, MRI showed L5-S1 disc disease. She has since had colonoscopy on 05/08/2017 with one 7 mm polyp removed from the sigmoid colon which was a tubular adenoma noted to have few sigmoid diverticuli and internal hemorrhoids. She will follow-up with colonoscopy in 5 years. She has also been undergoing GYN evaluation. I GYN notes she has now been diagnosed with uterine  adenomyosis, and a small fibroid. Symptoms have not improved with birth control pills and plan he is now for surgery for dysmenorrhea/adenomyosis with hysterectomy and BSO. Patient just had appointment with GYN today. She is very anxious and tearful at this appointment, and his expresses that she's afraid that she has cancer once to have surgery as soon as possible. Her complaints are primarily of the same lower abdominal and pelvic discomfort. She says this is worse when she's having menstrual bleeding, since being on birth control pills apparently she has been having some ongoing low-grade bleeding. Her bowel movements are fairly normal. She also has had some migrating upper abdominal discomfort. She has been taking an NSAID on a regular basis for her pelvic pain but is also been on omeprazole 20 mg daily. Her appetite is been okay, she's not having any nausea or vomiting, no postprandial worsening of pain. Generally sleeping okay but occasionally awakened with lower abdominal discomfort.  Review of Systems Pertinent positive and negative review of systems  were noted in the above HPI section.  All other review of systems was otherwise negative.  Outpatient Encounter Prescriptions as of 05/19/2017  Medication Sig  . Calcium-Magnesium-Vitamin D (CALCIUM 500 PO) Take by mouth.  . cholecalciferol (VITAMIN D) 1000 units tablet Take 1,000 Units by mouth daily.  Marland Kitchen FLUoxetine (PROZAC) 20 MG tablet Take 1 tablet (20 mg total) by mouth daily.  Marland Kitchen ibuprofen (ADVIL,MOTRIN) 600 MG tablet Take 1 tablet (600 mg total) by mouth every 8 (eight) hours as needed.  Marland Kitchen LORazepam (ATIVAN) 0.5 MG tablet Take 1/2 to 1 tablet twice daily as needed for anxiety.  Marland Kitchen omega-3 acid ethyl esters (LOVAZA) 1 G capsule Take by mouth 2 (two) times daily.  Marland Kitchen omeprazole (PRILOSEC) 20 MG capsule Take 1 tab by mouth every morning.  . [DISCONTINUED] omeprazole (PRILOSEC) 20 MG capsule Take 1 capsule (20 mg total) by mouth daily.   Facility-Administered Encounter Medications as of 05/19/2017  Medication  . 0.9 %  sodium chloride infusion   Allergies  Allergen Reactions  . Tylenol [Acetaminophen] Rash   Patient Active Problem List   Diagnosis Date Noted  . Degenerative disc disease at L5-S1 level 04/30/2017  . Lumbar disc herniation 04/30/2017  . Spinal stenosis of lumbar region 04/30/2017  . Chronic left-sided low back pain with left-sided sciatica 04/14/2017  . Cephalalgia 09/06/2014  . Skin depigmentation 09/06/2014  . Accessory skin tags 09/06/2014  . High triglycerides 09/06/2014  . Depression 09/06/2014  . Abnormal LFTs 09/06/2014  . AMA (advanced maternal age) multigravida 35+ 10/01/2011  . History of cesarean section 10/01/2011   Social History  Social History  . Marital status: Married    Spouse name: N/A  . Number of children: 5  . Years of education: N/A   Occupational History  . homemaker    Social History Main Topics  . Smoking status: Never Smoker  . Smokeless tobacco: Never Used  . Alcohol use No  . Drug use: No  . Sexual activity: Yes     Partners: Male    Birth control/ protection: Pill     Comment: depoprovera   Other Topics Concern  . Not on file   Social History Narrative  . No narrative on file    Aimee Figueroa's family history includes Cancer in her paternal aunt; Diabetes in her father; Hyperlipidemia in her father and mother; Ulcerative colitis in her cousin.      Objective:    Vitals:   05/19/17 1442  BP: 100/60  Pulse: 86    Physical Exam   well-developed Hispanic female in no acute distress, anxious blood pressure 100/60 pulse 86, BMI 27.4. HEENT; nontraumatic normocephalic EOMI PERRLA sclera anicteric, Cardiovascular regular rate and rhythm with S1-S2 no murmur or gallop, Pulmonary ;clear bilaterally abdomen soft, she has some mild rather generalized subjective abdominal tenderness and some mild tenderness across the lower Abdomen; there is no guarding or rebound no palpable mass or hepatosplenomegaly bowel sounds are present, Rectal ;exam not done, Neuropsych; mood and affect appropriate, tearful and anxious       Assessment & Plan:   #52  46 year old Hispanic female with 3 -4 month history of persistent lower abdominal/pelvic pain. She has had rather extensive evaluation, and no GI etiology for her pain has been found. She certainly may have a component of IBS. She is extremely anxious at this point and all of her symptoms are being exacerbated by anxiety She has been diagnosed with adenomyosis, and has a very small fibroid and GYN is in process of scheduling her for hysterectomy and BSO. #2 adenomatous colon polyps-will need follow-up colonoscopy 2023 #3 mild diverticulosis #4 small internal hemorrhoids  Plan continue omeprazole 20 mg by mouth daily for another month, covering her primarily for continued NSAID use Patient asked to limit naproxen use as much as possible We'll give her a short-term prescription for Ativan 0.5 mg, start with one half tablet by mouth twice a day when necessary for anxiety  #20 no refills. Long discussion today about her workup to date, and reassurance that there has been no evidence of malignancy to date.  Flint Hakeem S Teal Bontrager PA-C 05/20/2017   Cc: Orlando Penner*

## 2017-05-21 ENCOUNTER — Ambulatory Visit (INDEPENDENT_AMBULATORY_CARE_PROVIDER_SITE_OTHER): Payer: Self-pay | Admitting: Obstetrics & Gynecology

## 2017-05-21 ENCOUNTER — Encounter: Payer: Self-pay | Admitting: Obstetrics & Gynecology

## 2017-05-21 ENCOUNTER — Ambulatory Visit (INDEPENDENT_AMBULATORY_CARE_PROVIDER_SITE_OTHER): Payer: Self-pay | Admitting: Orthopedic Surgery

## 2017-05-21 VITALS — BP 128/86

## 2017-05-21 DIAGNOSIS — Z641 Problems related to multiparity: Secondary | ICD-10-CM

## 2017-05-21 DIAGNOSIS — M5137 Other intervertebral disc degeneration, lumbosacral region: Secondary | ICD-10-CM

## 2017-05-21 DIAGNOSIS — R102 Pelvic and perineal pain: Secondary | ICD-10-CM

## 2017-05-21 DIAGNOSIS — N921 Excessive and frequent menstruation with irregular cycle: Secondary | ICD-10-CM

## 2017-05-21 DIAGNOSIS — M5136 Other intervertebral disc degeneration, lumbar region: Secondary | ICD-10-CM

## 2017-05-21 NOTE — Patient Instructions (Signed)
1. Menometrorrhagia Persistent after 2 injections of DepoProvera and associated with severe pelvic cramps and pain.  Multiparous with h/o 4 SVD/1 C/S.  Decision to proceed with LAVH/Bilateral Salpingectomy.  Surgery and risks reviewed in details.  Preparation for procedure and Postop reviewed.  Will schedule asap.                        Patient was counseled as to the risk of surgery to include the following:  1. Infection (prohylactic antibiotics will be administered)  2. DVT/Pulmonary Embolism (prophylactic pneumo compression stockings will be used)  3.Trauma to internal organs requiring additional surgical procedure to repair any injury to     Internal organs requiring perhaps additional hospitalization days.  4.Hemmorhage requiring transfusion and blood products which carry risks such as anaphylactic reaction, hepatitis and AIDS  Patient had received literature information on the procedure scheduled and all her questions were answered and fully accepts all risk.   2. Pelvic pain in female Probable Adenomyosis.  3. Degenerative disc disease at L5-S1 level Lower back pain.  4. Multiparous H/O 4 SVD, 1 C/S.   Aimee Figueroa, it was a pleasure to see you today with your family!  I will schedule the surgery as soon as possible.

## 2017-05-21 NOTE — Progress Notes (Signed)
    Aimee Figueroa 1971-05-07 [id number]        46 y.o.  [id number]   RP:  Menometrorrhagia with persistent pelvic pain for preop LAVH/Bilateral Salpingectomy   HPI:  Received second dose of DepoProvera on 05/13/2017.  Since then the vaginal bleeding is milder, but still present daily with severe pelvic cramps and pain.     Past medical history,surgical history, problem list, medications, allergies, family history and social history were all reviewed and documented in the EPIC chart.  Directed ROS with pertinent positives and negatives documented in the history of present illness/assessment and plan.  Exam:  There were no vitals filed for this visit. General appearance:  Normal  Pelvic US 03/06/2017:  Uterus:  Measurements: 10.7 x 4.8 x 6.9 cm. Mildly heterogeneous echotexture.  Left posterolateral small subserosal uterine fibroid measures 2.7 x 1.9 x 2.4 cm.  Endometrium:  Thickness: 8 mm.  No focal abnormality visualized.  Right ovary:  Measurements: 3.4 x 2.0 x 2.2 cm. Normal appearance/no adnexal mass.  Left ovary:  Measurements: 2.5 x 2.1 x 2.1 cm. Normal appearance/no adnexal mass.  No abnormal free fluid.   Assessment/Plan:  46 y.o. [id number]   1. Menometrorrhagia Persistent after 2 injections of DepoProvera and associated with severe pelvic cramps and pain.  Multiparous with h/o 4 SVD/1 C/S.  Decision to proceed with LAVH/Bilateral Salpingectomy.  Surgery and risks reviewed in details.  Preparation for procedure and Postop reviewed.  Will schedule asap.                        Patient was counseled as to the risk of surgery to include the following:  1. Infection (prohylactic antibiotics will be administered)  2. DVT/Pulmonary Embolism (prophylactic pneumo compression stockings will be used)  3.Trauma to internal organs requiring additional surgical procedure to repair any injury to     Internal organs requiring perhaps additional hospitalization days.  4.Hemmorhage requiring  transfusion and blood products which carry risks such as anaphylactic reaction, hepatitis and AIDS  Patient had received literature information on the procedure scheduled and all her questions were answered and fully accepts all risk.   2. Pelvic pain in female Probable Adenomyosis.  3. Degenerative disc disease at L5-S1 level Lower back pain.  4. Multiparous H/O 4 SVD, 1 C/S.  Counseling on above issues >50% x 15 minutes.   [name] MD, 10:42 AM 05/21/2017

## 2017-05-22 ENCOUNTER — Other Ambulatory Visit: Payer: Self-pay | Admitting: Family Medicine

## 2017-05-22 ENCOUNTER — Telehealth: Payer: Self-pay | Admitting: Family Medicine

## 2017-05-22 ENCOUNTER — Telehealth: Payer: Self-pay | Admitting: *Deleted

## 2017-05-22 DIAGNOSIS — E782 Mixed hyperlipidemia: Secondary | ICD-10-CM

## 2017-05-22 DIAGNOSIS — B9681 Helicobacter pylori [H. pylori] as the cause of diseases classified elsewhere: Secondary | ICD-10-CM

## 2017-05-22 DIAGNOSIS — E785 Hyperlipidemia, unspecified: Secondary | ICD-10-CM | POA: Insufficient documentation

## 2017-05-22 DIAGNOSIS — A048 Other specified bacterial intestinal infections: Secondary | ICD-10-CM

## 2017-05-22 DIAGNOSIS — E781 Pure hyperglyceridemia: Secondary | ICD-10-CM

## 2017-05-22 DIAGNOSIS — K253 Acute gastric ulcer without hemorrhage or perforation: Secondary | ICD-10-CM

## 2017-05-22 HISTORY — DX: Helicobacter pylori (H. pylori) as the cause of diseases classified elsewhere: K25.3

## 2017-05-22 HISTORY — DX: Helicobacter pylori (H. pylori) as the cause of diseases classified elsewhere: B96.81

## 2017-05-22 MED ORDER — HYDROCODONE-ACETAMINOPHEN 10-300 MG PO TABS: ORAL_TABLET | ORAL | 0 refills | Status: DC

## 2017-05-22 MED ORDER — IBUPROFEN 800 MG PO TABS: 800.0000 mg | ORAL_TABLET | Freq: Three times a day (TID) | ORAL | 0 refills | Status: DC | PRN

## 2017-05-22 MED ORDER — OMEPRAZOLE 20 MG PO CPDR: DELAYED_RELEASE_CAPSULE | ORAL | 3 refills | Status: DC

## 2017-05-22 MED ORDER — AMOXICILLIN 500 MG PO TABS: 1000.0000 mg | ORAL_TABLET | Freq: Two times a day (BID) | ORAL | 0 refills | Status: DC

## 2017-05-22 MED ORDER — ATORVASTATIN CALCIUM 20 MG PO TABS: 20.0000 mg | ORAL_TABLET | Freq: Every day | ORAL | 2 refills | Status: DC

## 2017-05-22 MED ORDER — CLARITHROMYCIN 500 MG PO TABS: 500.0000 mg | ORAL_TABLET | Freq: Two times a day (BID) | ORAL | 0 refills | Status: DC

## 2017-05-22 NOTE — Telephone Encounter (Signed)
Can send Vicodin 1 tab PO q 4-6 hours as needed.  #20.  Recommend Ibuprofen 800 mg PO q8 hours regularly first, then if not enough, Vicodin as needed.

## 2017-05-22 NOTE — Telephone Encounter (Signed)
Called and spoke with patient. Elevated lipids and positive H.pylori screen. Will prescribed atorvastatin to help lower levels and 14 day course of ABT and PPI to treat. X-ray results normal. She communicates understanding and is agreeable to plan.

## 2017-05-22 NOTE — Patient Instructions (Signed)
Your procedure is scheduled on:  Tuesday, Aug. 28, 2018  Enter through the [employer] [facility] at:  9:30 AM  Pick up the phone at the desk and dial [phone].  Call this number if you have problems the morning of surgery: [phone].  Remember: Do NOT eat food or drink after:  Midnight Monday  Take these medicines the morning of surgery with a SIP OF WATER:  Lorazepam if needed  Stop ALL herbal medications at this time  Do NOT smoke the day of surgery.  Do NOT wear jewelry (body piercing), metal hair clips/bobby pins, make-up, artifical eyelashes or nail polish. Do NOT wear lotions, powders, or perfumes.  You may wear deodorant. Do NOT shave for 48 hours prior to surgery. Do NOT bring valuables to the hospital. Contacts, dentures, or bridgework may not be worn into surgery.  Leave suitcase in car.  After surgery it may be brought to your room.  For patients admitted to the hospital, checkout time is 11:00 AM the day of discharge.  Bring a copy of your healthcare power of attorney and living will documents.

## 2017-05-22 NOTE — Telephone Encounter (Signed)
Pt called spoke with Rosemarie Ax c/o severe pelvic pain asked if Rx could be given to help pain.

## 2017-05-22 NOTE — Telephone Encounter (Signed)
Pt called to request lab result please follow up

## 2017-05-22 NOTE — Telephone Encounter (Signed)
CMA call regarding patient was requesting lab results   Patient did not answer but left a VM that it takes 4-5 business day to be resulted & that we will call patient once we get them

## 2017-05-22 NOTE — Telephone Encounter (Signed)
Aimee Figueroa will relay Rx printed for both Rx, pt will come pick up.

## 2017-05-23 NOTE — Progress Notes (Signed)
Reviewed and agree with documentation and assessment and plan. K. Veena Shuntel Fishburn , MD   

## 2017-05-25 ENCOUNTER — Encounter (HOSPITAL_COMMUNITY)
Admission: RE | Admit: 2017-05-25 | Discharge: 2017-05-25 | Disposition: A | Discharge status: Home / Self Care | Payer: Self-pay | Source: Ambulatory Visit | Attending: Obstetrics & Gynecology | Admitting: Obstetrics & Gynecology

## 2017-05-25 ENCOUNTER — Telehealth: Payer: Self-pay | Admitting: *Deleted

## 2017-05-25 ENCOUNTER — Encounter (HOSPITAL_COMMUNITY): Payer: Self-pay

## 2017-05-25 DIAGNOSIS — M5126 Other intervertebral disc displacement, lumbar region: Secondary | ICD-10-CM | POA: Insufficient documentation

## 2017-05-25 DIAGNOSIS — Z0183 Encounter for blood typing: Secondary | ICD-10-CM | POA: Insufficient documentation

## 2017-05-25 DIAGNOSIS — Z01812 Encounter for preprocedural laboratory examination: Secondary | ICD-10-CM | POA: Insufficient documentation

## 2017-05-25 DIAGNOSIS — M5137 Other intervertebral disc degeneration, lumbosacral region: Secondary | ICD-10-CM | POA: Insufficient documentation

## 2017-05-25 HISTORY — DX: Fibromyalgia: M79.7

## 2017-05-25 HISTORY — DX: Hypotension, unspecified: I95.9

## 2017-05-25 LAB — TYPE AND SCREEN
ABO/RH(D): O POS
Antibody Screen: NEGATIVE

## 2017-05-25 MED FILL — ?ATORVASTATIN 20 MG TABLET: 20 | 30 days supply | Qty: 30 | Fill #0

## 2017-05-25 MED FILL — CLARITHROMYCIN 500 MG TAB: 500 | 14 days supply | Qty: 28 | Fill #0

## 2017-05-25 MED FILL — AMOXICILLIN 500 MG CAPSULE: 500 | 14 days supply | Qty: 56 | Fill #0

## 2017-05-25 MED FILL — ?OMEPRAZOLE DR 20 MG CAPSUL: 20 | 15 days supply | Qty: 30 | Fill #0

## 2017-05-25 NOTE — Telephone Encounter (Signed)
Patient had pre-op labs done at hospital today , Tamika called stating pt had a low grade fever 100.4 and she noticed patient H. Pylori breath test was positive at her PCP office on 05/22/17, Tamika states pt told her MD prescribed a Rx but told her not to take until after surgery. Tamika just wanted you to be aware of this.

## 2017-05-26 ENCOUNTER — Encounter (HOSPITAL_COMMUNITY): Payer: Self-pay | Admitting: *Deleted

## 2017-05-26 ENCOUNTER — Ambulatory Visit (HOSPITAL_COMMUNITY): Payer: Self-pay | Admitting: Anesthesiology

## 2017-05-26 ENCOUNTER — Observation Stay (HOSPITAL_COMMUNITY)
Admission: RE | Admit: 2017-05-26 | Discharge: 2017-05-27 | Disposition: A | Discharge status: Home / Self Care | Payer: Self-pay | Source: Ambulatory Visit | Attending: Obstetrics & Gynecology | Admitting: Obstetrics & Gynecology

## 2017-05-26 ENCOUNTER — Encounter (HOSPITAL_COMMUNITY)
Admission: RE | Disposition: A | Discharge status: Home / Self Care | Payer: Self-pay | Source: Ambulatory Visit | Attending: Obstetrics & Gynecology

## 2017-05-26 DIAGNOSIS — Z886 Allergy status to analgesic agent status: Secondary | ICD-10-CM | POA: Insufficient documentation

## 2017-05-26 DIAGNOSIS — G8929 Other chronic pain: Secondary | ICD-10-CM | POA: Insufficient documentation

## 2017-05-26 DIAGNOSIS — Z8611 Personal history of tuberculosis: Secondary | ICD-10-CM | POA: Insufficient documentation

## 2017-05-26 DIAGNOSIS — D259 Leiomyoma of uterus, unspecified: Principal | ICD-10-CM | POA: Insufficient documentation

## 2017-05-26 DIAGNOSIS — N921 Excessive and frequent menstruation with irregular cycle: Secondary | ICD-10-CM

## 2017-05-26 DIAGNOSIS — M797 Fibromyalgia: Secondary | ICD-10-CM | POA: Insufficient documentation

## 2017-05-26 DIAGNOSIS — D252 Subserosal leiomyoma of uterus: Secondary | ICD-10-CM

## 2017-05-26 DIAGNOSIS — N736 Female pelvic peritoneal adhesions (postinfective): Secondary | ICD-10-CM

## 2017-05-26 DIAGNOSIS — Z833 Family history of diabetes mellitus: Secondary | ICD-10-CM | POA: Insufficient documentation

## 2017-05-26 DIAGNOSIS — Z8711 Personal history of peptic ulcer disease: Secondary | ICD-10-CM | POA: Insufficient documentation

## 2017-05-26 DIAGNOSIS — Z8049 Family history of malignant neoplasm of other genital organs: Secondary | ICD-10-CM | POA: Insufficient documentation

## 2017-05-26 DIAGNOSIS — I959 Hypotension, unspecified: Secondary | ICD-10-CM | POA: Insufficient documentation

## 2017-05-26 DIAGNOSIS — E785 Hyperlipidemia, unspecified: Secondary | ICD-10-CM | POA: Insufficient documentation

## 2017-05-26 DIAGNOSIS — Z8249 Family history of ischemic heart disease and other diseases of the circulatory system: Secondary | ICD-10-CM | POA: Insufficient documentation

## 2017-05-26 DIAGNOSIS — F329 Major depressive disorder, single episode, unspecified: Secondary | ICD-10-CM | POA: Insufficient documentation

## 2017-05-26 DIAGNOSIS — Z9889 Other specified postprocedural states: Secondary | ICD-10-CM

## 2017-05-26 DIAGNOSIS — M5137 Other intervertebral disc degeneration, lumbosacral region: Secondary | ICD-10-CM | POA: Insufficient documentation

## 2017-05-26 DIAGNOSIS — R102 Pelvic and perineal pain: Secondary | ICD-10-CM

## 2017-05-26 DIAGNOSIS — M1990 Unspecified osteoarthritis, unspecified site: Secondary | ICD-10-CM | POA: Insufficient documentation

## 2017-05-26 DIAGNOSIS — Z8379 Family history of other diseases of the digestive system: Secondary | ICD-10-CM | POA: Insufficient documentation

## 2017-05-26 HISTORY — DX: Helicobacter pylori (H. pylori) as the cause of diseases classified elsewhere: B96.81

## 2017-05-26 HISTORY — DX: Acute gastric ulcer without hemorrhage or perforation: K25.3

## 2017-05-26 HISTORY — PX: LAPAROSCOPIC BILATERAL SALPINGECTOMY: SHX5889

## 2017-05-26 HISTORY — PX: LAPAROSCOPIC ASSISTED VAGINAL HYSTERECTOMY: SHX5398

## 2017-05-26 LAB — PREGNANCY, URINE: PREG TEST UR: NEGATIVE

## 2017-05-26 SURGERY — HYSTERECTOMY, VAGINAL, LAPAROSCOPY-ASSISTED
Anesthesia: General | Site: Abdomen

## 2017-05-26 MED ORDER — PHENYLEPHRINE HCL 10 MG/ML IJ SOLN
INTRAMUSCULAR | Status: DC | PRN
  Administered 2017-05-26 (×6): 80 ug via INTRAVENOUS
  Administered 2017-05-26: 40 ug via INTRAVENOUS
  Administered 2017-05-26 (×3): 80 ug via INTRAVENOUS

## 2017-05-26 MED ORDER — DEXAMETHASONE SODIUM PHOSPHATE 10 MG/ML IJ SOLN
INTRAMUSCULAR | Status: AC
  Filled 2017-05-26: qty 1

## 2017-05-26 MED ORDER — HYDROMORPHONE HCL 1 MG/ML IJ SOLN
INTRAMUSCULAR | Status: AC
  Filled 2017-05-26: qty 0.5

## 2017-05-26 MED ORDER — ALBUMIN HUMAN 5 % IV SOLN
INTRAVENOUS | Status: DC | PRN
  Administered 2017-05-26: 13:00:00 via INTRAVENOUS

## 2017-05-26 MED ORDER — HYDROMORPHONE HCL 1 MG/ML IJ SOLN
0.2500 mg | INTRAMUSCULAR | Status: DC | PRN
  Administered 2017-05-26 (×4): 0.5 mg via INTRAVENOUS

## 2017-05-26 MED ORDER — DIPHENHYDRAMINE HCL 50 MG/ML IJ SOLN
INTRAMUSCULAR | Status: AC
  Administered 2017-05-26: 12.5 mg
  Filled 2017-05-26: qty 1

## 2017-05-26 MED ORDER — PROMETHAZINE HCL 25 MG/ML IJ SOLN: 6.2500 mg | INTRAMUSCULAR | Status: DC | PRN

## 2017-05-26 MED ORDER — LIDOCAINE-EPINEPHRINE (PF) 1 %-1:200000 IJ SOLN
INTRAMUSCULAR | Status: AC
  Filled 2017-05-26: qty 30

## 2017-05-26 MED ORDER — HYDROMORPHONE HCL 1 MG/ML IJ SOLN
0.5000 mg | INTRAMUSCULAR | Status: DC | PRN
  Administered 2017-05-26 (×3): 0.5 mg via INTRAVENOUS

## 2017-05-26 MED ORDER — PROPOFOL 10 MG/ML IV BOLUS
INTRAVENOUS | Status: DC | PRN
  Administered 2017-05-26: 200 mg via INTRAVENOUS

## 2017-05-26 MED ORDER — AMOXICILLIN 500 MG PO TABS: 1000.0000 mg | ORAL_TABLET | Freq: Two times a day (BID) | ORAL | Status: DC

## 2017-05-26 MED ORDER — SILVER NITRATE-POT NITRATE 75-25 % EX MISC
CUTANEOUS | Status: AC
  Filled 2017-05-26: qty 1

## 2017-05-26 MED ORDER — LIDOCAINE HCL (CARDIAC) 20 MG/ML IV SOLN
INTRAVENOUS | Status: AC
  Filled 2017-05-26: qty 5

## 2017-05-26 MED ORDER — TRAMADOL HCL 50 MG PO TABS
50.0000 mg | ORAL_TABLET | Freq: Four times a day (QID) | ORAL | Status: DC | PRN
  Administered 2017-05-26 – 2017-05-27 (×2): 50 mg via ORAL
  Filled 2017-05-26 (×2): qty 1

## 2017-05-26 MED ORDER — BUPIVACAINE HCL (PF) 0.25 % IJ SOLN
INTRAMUSCULAR | Status: AC
  Filled 2017-05-26: qty 30

## 2017-05-26 MED ORDER — PROPOFOL 10 MG/ML IV BOLUS
INTRAVENOUS | Status: AC
  Filled 2017-05-26: qty 20

## 2017-05-26 MED ORDER — LACTATED RINGERS IV SOLN
INTRAVENOUS | Status: DC
  Administered 2017-05-26: 17:00:00 via INTRAVENOUS

## 2017-05-26 MED ORDER — DIPHENHYDRAMINE HCL 50 MG/ML IJ SOLN
12.5000 mg | Freq: Once | INTRAMUSCULAR | Status: AC
  Administered 2017-05-26: 12.5 mg via INTRAVENOUS

## 2017-05-26 MED ORDER — LACTATED RINGERS IV SOLN
INTRAVENOUS | Status: DC
  Administered 2017-05-26 (×3): via INTRAVENOUS

## 2017-05-26 MED ORDER — SCOPOLAMINE 1 MG/3DAYS TD PT72
1.0000 | MEDICATED_PATCH | Freq: Once | TRANSDERMAL | Status: DC
  Administered 2017-05-26: 1.5 mg via TRANSDERMAL

## 2017-05-26 MED ORDER — PANTOPRAZOLE SODIUM 40 MG PO TBEC
80.0000 mg | DELAYED_RELEASE_TABLET | Freq: Every day | ORAL | Status: DC
  Administered 2017-05-27: 80 mg via ORAL
  Filled 2017-05-26 (×2): qty 2

## 2017-05-26 MED ORDER — ROCURONIUM BROMIDE 100 MG/10ML IV SOLN
INTRAVENOUS | Status: DC | PRN
  Administered 2017-05-26: 50 mg via INTRAVENOUS

## 2017-05-26 MED ORDER — HYDROMORPHONE HCL 1 MG/ML IJ SOLN
0.5000 mg | INTRAMUSCULAR | Status: DC | PRN
  Administered 2017-05-26 (×2): 0.5 mg via INTRAVENOUS
  Filled 2017-05-26 (×2): qty 0.5

## 2017-05-26 MED ORDER — ONDANSETRON HCL 4 MG/2ML IJ SOLN
INTRAMUSCULAR | Status: DC | PRN
  Administered 2017-05-26: 4 mg via INTRAVENOUS

## 2017-05-26 MED ORDER — CEFAZOLIN SODIUM-DEXTROSE 2-4 GM/100ML-% IV SOLN
2.0000 g | INTRAVENOUS | Status: AC
  Administered 2017-05-26: 2 g via INTRAVENOUS

## 2017-05-26 MED ORDER — SCOPOLAMINE 1 MG/3DAYS TD PT72
MEDICATED_PATCH | TRANSDERMAL | Status: AC
  Administered 2017-05-26: 1.5 mg via TRANSDERMAL
  Filled 2017-05-26: qty 1

## 2017-05-26 MED ORDER — HYDROMORPHONE HCL 1 MG/ML IJ SOLN
INTRAMUSCULAR | Status: AC
  Filled 2017-05-26: qty 1

## 2017-05-26 MED ORDER — SODIUM CHLORIDE 0.9 % IR SOLN
Status: DC | PRN
  Administered 2017-05-26: 3000 mL

## 2017-05-26 MED ORDER — SUGAMMADEX SODIUM 200 MG/2ML IV SOLN
INTRAVENOUS | Status: AC
  Filled 2017-05-26: qty 2

## 2017-05-26 MED ORDER — DIPHENHYDRAMINE HCL 12.5 MG/5ML PO ELIX
12.5000 mg | ORAL_SOLUTION | Freq: Once | ORAL | Status: DC
  Filled 2017-05-26: qty 5

## 2017-05-26 MED ORDER — ONDANSETRON HCL 4 MG/2ML IJ SOLN
INTRAMUSCULAR | Status: AC
  Filled 2017-05-26: qty 2

## 2017-05-26 MED ORDER — IBUPROFEN 600 MG PO TABS
600.0000 mg | ORAL_TABLET | Freq: Four times a day (QID) | ORAL | Status: DC | PRN
  Administered 2017-05-26 – 2017-05-27 (×2): 600 mg via ORAL
  Filled 2017-05-26 (×2): qty 1

## 2017-05-26 MED ORDER — MIDAZOLAM HCL 2 MG/2ML IJ SOLN
INTRAMUSCULAR | Status: AC
  Filled 2017-05-26: qty 2

## 2017-05-26 MED ORDER — LIDOCAINE HCL (CARDIAC) 20 MG/ML IV SOLN
INTRAVENOUS | Status: DC | PRN
  Administered 2017-05-26: 80 mg via INTRAVENOUS

## 2017-05-26 MED ORDER — ATORVASTATIN CALCIUM 20 MG PO TABS
20.0000 mg | ORAL_TABLET | Freq: Every day | ORAL | Status: DC
  Administered 2017-05-27: 20 mg via ORAL
  Filled 2017-05-26 (×3): qty 1

## 2017-05-26 MED ORDER — AMOXICILLIN 500 MG PO CAPS
1000.0000 mg | ORAL_CAPSULE | Freq: Two times a day (BID) | ORAL | Status: DC
  Administered 2017-05-26 – 2017-05-27 (×2): 1000 mg via ORAL
  Filled 2017-05-26 (×3): qty 2

## 2017-05-26 MED ORDER — FENTANYL CITRATE (PF) 250 MCG/5ML IJ SOLN
INTRAMUSCULAR | Status: AC
  Filled 2017-05-26: qty 5

## 2017-05-26 MED ORDER — MEPERIDINE HCL 25 MG/ML IJ SOLN: 6.2500 mg | INTRAMUSCULAR | Status: DC | PRN

## 2017-05-26 MED ORDER — OXYCODONE HCL 5 MG PO TABS: 5.0000 mg | ORAL_TABLET | Freq: Once | ORAL | Status: DC | PRN

## 2017-05-26 MED ORDER — FENTANYL CITRATE (PF) 100 MCG/2ML IJ SOLN
INTRAMUSCULAR | Status: DC | PRN
  Administered 2017-05-26: 50 ug via INTRAVENOUS
  Administered 2017-05-26: 100 ug via INTRAVENOUS
  Administered 2017-05-26 (×2): 50 ug via INTRAVENOUS

## 2017-05-26 MED ORDER — CEFAZOLIN SODIUM-DEXTROSE 2-4 GM/100ML-% IV SOLN
INTRAVENOUS | Status: AC
  Filled 2017-05-26: qty 100

## 2017-05-26 MED ORDER — OXYCODONE HCL 5 MG/5ML PO SOLN: 5.0000 mg | Freq: Once | ORAL | Status: DC | PRN

## 2017-05-26 MED ORDER — KETOROLAC TROMETHAMINE 30 MG/ML IJ SOLN: 30.0000 mg | Freq: Once | INTRAMUSCULAR | Status: DC | PRN

## 2017-05-26 MED ORDER — LIDOCAINE-EPINEPHRINE (PF) 1 %-1:200000 IJ SOLN
INTRAMUSCULAR | Status: DC | PRN
  Administered 2017-05-26: 20 mL

## 2017-05-26 MED ORDER — DEXAMETHASONE SODIUM PHOSPHATE 10 MG/ML IJ SOLN
INTRAMUSCULAR | Status: DC | PRN
  Administered 2017-05-26: 10 mg via INTRAVENOUS

## 2017-05-26 MED ORDER — MIDAZOLAM HCL 2 MG/2ML IJ SOLN
INTRAMUSCULAR | Status: DC | PRN
  Administered 2017-05-26: 2 mg via INTRAVENOUS

## 2017-05-26 MED ORDER — HYDROMORPHONE HCL 1 MG/ML IJ SOLN
INTRAMUSCULAR | Status: DC | PRN
  Administered 2017-05-26 (×2): 0.5 mg via INTRAVENOUS

## 2017-05-26 MED ORDER — SUGAMMADEX SODIUM 200 MG/2ML IV SOLN
INTRAVENOUS | Status: DC | PRN
  Administered 2017-05-26: 200 mg via INTRAVENOUS

## 2017-05-26 MED ORDER — CLARITHROMYCIN 500 MG PO TABS
500.0000 mg | ORAL_TABLET | Freq: Two times a day (BID) | ORAL | Status: DC
  Filled 2017-05-26 (×2): qty 1

## 2017-05-26 MED ORDER — ALBUMIN HUMAN 5 % IV SOLN
INTRAVENOUS | Status: AC
  Filled 2017-05-26: qty 250

## 2017-05-26 MED ORDER — PHENYLEPHRINE 40 MCG/ML (10ML) SYRINGE FOR IV PUSH (FOR BLOOD PRESSURE SUPPORT)
PREFILLED_SYRINGE | INTRAVENOUS | Status: AC
  Filled 2017-05-26: qty 20

## 2017-05-26 SURGICAL SUPPLY — 58 items
ADH SKN CLS APL DERMABOND .7 (GAUZE/BANDAGES/DRESSINGS) ×2
APPLICATOR COTTON TIP 6IN STRL (MISCELLANEOUS) ×4 IMPLANT
BAG SPEC RTRVL LRG 6X4 10 (ENDOMECHANICALS)
CABLE HIGH FREQUENCY MONO STRZ (ELECTRODE) IMPLANT
CATH ROBINSON RED A/P 16FR (CATHETERS) ×2 IMPLANT
CLOTH BEACON ORANGE TIMEOUT ST (SAFETY) ×4 IMPLANT
CONT PATH 16OZ SNAP LID 3702 (MISCELLANEOUS) ×4 IMPLANT
COVER BACK TABLE 60X90IN (DRAPES) ×4 IMPLANT
DECANTER SPIKE VIAL GLASS SM (MISCELLANEOUS) ×4 IMPLANT
DERMABOND ADVANCED (GAUZE/BANDAGES/DRESSINGS) ×2
DERMABOND ADVANCED .7 DNX12 (GAUZE/BANDAGES/DRESSINGS) ×2 IMPLANT
DRSG OPSITE POSTOP 3X4 (GAUZE/BANDAGES/DRESSINGS) ×2 IMPLANT
DURAPREP 26ML APPLICATOR (WOUND CARE) ×4 IMPLANT
ELECT REM PT RETURN 9FT ADLT (ELECTROSURGICAL) ×4
ELECTRODE REM PT RTRN 9FT ADLT (ELECTROSURGICAL) ×2 IMPLANT
FILTER SMOKE EVAC LAPAROSHD (FILTER) IMPLANT
GAUZE PACKING IODOFORM 1X5 (MISCELLANEOUS) IMPLANT
GLOVE BIO SURGEON STRL SZ 6.5 (GLOVE) ×6 IMPLANT
GLOVE BIO SURGEONS STRL SZ 6.5 (GLOVE) ×2
GLOVE BIOGEL PI IND STRL 7.0 (GLOVE) ×8 IMPLANT
GLOVE BIOGEL PI INDICATOR 7.0 (GLOVE) ×8
GOWN STRL REUS W/TWL LRG LVL3 (GOWN DISPOSABLE) ×12 IMPLANT
IV STOPCOCK 4 WAY 40  W/Y SET (IV SOLUTION)
IV STOPCOCK 4 WAY 40 W/Y SET (IV SOLUTION) IMPLANT
LEGGING LITHOTOMY PAIR STRL (DRAPES) ×4 IMPLANT
MANIPULATOR UTERINE 4.5 ZUMI (MISCELLANEOUS) IMPLANT
NDL MAYO CATGUT SZ4 TPR NDL (NEEDLE) ×2 IMPLANT
NEEDLE MAYO CATGUT SZ4 (NEEDLE) ×4 IMPLANT
PACK LAPAROSCOPY BASIN (CUSTOM PROCEDURE TRAY) ×2 IMPLANT
PACK LAVH (CUSTOM PROCEDURE TRAY) ×4 IMPLANT
PACK ROBOTIC GOWN (GOWN DISPOSABLE) ×4 IMPLANT
PACK TRENDGUARD 450 HYBRID PRO (MISCELLANEOUS) IMPLANT
PACK TRENDGUARD 600 HYBRD PROC (MISCELLANEOUS) IMPLANT
PENCIL BUTTON HOLSTER BLD 10FT (ELECTRODE) ×4 IMPLANT
POUCH LAPAROSCOPIC INSTRUMENT (MISCELLANEOUS) ×2 IMPLANT
POUCH SPECIMEN RETRIEVAL 10MM (ENDOMECHANICALS) IMPLANT
PROTECTOR NERVE ULNAR (MISCELLANEOUS) ×8 IMPLANT
SCALPEL HARMONIC ACE (MISCELLANEOUS) ×2 IMPLANT
SCISSORS LAP 5X35 DISP (ENDOMECHANICALS) IMPLANT
SEALER TISSUE G2 CVD JAW 35 (ENDOMECHANICALS) IMPLANT
SEALER TISSUE G2 CVD JAW 45CM (ENDOMECHANICALS) IMPLANT
SET IRRIG TUBING LAPAROSCOPIC (IRRIGATION / IRRIGATOR) ×4 IMPLANT
SLEEVE XCEL OPT CAN 5 100 (ENDOMECHANICALS) ×4 IMPLANT
SOLUTION ELECTROLUBE (MISCELLANEOUS) ×2 IMPLANT
SUT CHROMIC 0 CT 1 (SUTURE) IMPLANT
SUT MNCRL AB 4-0 PS2 18 (SUTURE) ×8 IMPLANT
SUT VIC AB 0 CT1 27 (SUTURE) ×8
SUT VIC AB 0 CT1 27XBRD ANBCTR (SUTURE) ×2 IMPLANT
SUT VICRYL #0 CTB 1 (SUTURE) ×12 IMPLANT
SUT VICRYL 0 TIES 12 18 (SUTURE) ×4 IMPLANT
SUT VICRYL 0 UR6 27IN ABS (SUTURE) ×8 IMPLANT
TOWEL OR 17X24 6PK STRL BLUE (TOWEL DISPOSABLE) ×8 IMPLANT
TRAY FOLEY CATH SILVER 14FR (SET/KITS/TRAYS/PACK) ×4 IMPLANT
TRENDGUARD 450 HYBRID PRO PACK (MISCELLANEOUS) ×4
TRENDGUARD 600 HYBRID PROC PK (MISCELLANEOUS)
TROCAR BALLN 12MMX100 BLUNT (TROCAR) ×4 IMPLANT
TROCAR XCEL NON-BLD 11X100MML (ENDOMECHANICALS) IMPLANT
TROCAR XCEL NON-BLD 5MMX100MML (ENDOMECHANICALS) ×4 IMPLANT

## 2017-05-26 NOTE — H&P (Signed)
Aimee Figueroa is an 46 y.o. female.  [id number]   RP:  Menometrorrhagia with persistent pelvic pain for preop LAVH/Bilateral Salpingectomy   HPI:  Received second dose of DepoProvera on 05/13/2017.  Since then the vaginal bleeding is milder, but still present daily with severe pelvic cramps and pain.     Pertinent Gynecological History: Menses: Menometro Contraception: Depo-Provera injections Blood transfusions: none Sexually transmitted diseases: HPV Previous GYN Procedures: Cryosurgery  Last mammogram: normal  Last pap: normal     Menstrual History:  Patient's last menstrual period was 05/01/2017.    Past Medical History:  Diagnosis Date  . Abnormal Pap smear   . Depression   . Fibromyalgia   . Hyperlipemia   . Low blood pressure   . Pyloric ulcer associated with Helicobacter pylori, acute 05/22/2017   patient not sure if she has an ulcer - first dx  . SVD (spontaneous vaginal delivery)    x 4  . Tuberculosis    47 yrs old    Past Surgical History:  Procedure Laterality Date  . CESAREAN SECTION    . COLONOSCOPY    . GYNECOLOGIC CRYOSURGERY      Family History  Problem Relation Age of Onset  . Diabetes Father   . Hyperlipidemia Father   . Hyperlipidemia Mother   . Cancer Paternal Aunt        uterine  . Ulcerative colitis Cousin   . Anesthesia problems Neg Hx   . Colon cancer Neg Hx   . Rectal cancer Neg Hx   . Esophageal cancer Neg Hx   . Liver cancer Neg Hx     Social History:  reports that she has never smoked. She has never used smokeless tobacco. She reports that she does not drink alcohol or use drugs.  Allergies:  Allergies  Allergen Reactions  . Tylenol [Acetaminophen] Rash    Facility-Administered Medications Prior to Admission  Medication Dose Route Frequency Provider Last Rate Last Dose  . 0.9 %  sodium chloride infusion  500 mL Intravenous Continuous Nandigam, Venia Minks, MD       Prescriptions Prior to Admission  Medication Sig  Dispense Refill Last Dose  . amoxicillin (AMOXIL) 500 MG tablet Take 2 tablets (1,000 mg total) by mouth 2 (two) times daily. 56 tablet 0 05/25/2017 at Unknown time  . Cholecalciferol (VITAMIN D3) 2000 units TABS Take 2,000 Units by mouth daily.   05/25/2017 at Unknown time  . clarithromycin (BIAXIN) 500 MG tablet Take 1 tablet (500 mg total) by mouth 2 (two) times daily. 28 tablet 0   . ibuprofen (ADVIL,MOTRIN) 600 MG tablet Take 1 tablet (600 mg total) by mouth every 8 (eight) hours as needed. (Patient taking differently: Take 600 mg by mouth every 8 (eight) hours as needed (for pain.). ) 30 tablet 0 05/25/2017 at Unknown time  . ibuprofen (ADVIL,MOTRIN) 800 MG tablet Take 1 tablet (800 mg total) by mouth every 8 (eight) hours as needed. 30 tablet 0 Past Week at Unknown time  . LORazepam (ATIVAN) 0.5 MG tablet Take 1/2 to 1 tablet twice daily as needed for anxiety. (Patient taking differently: Take 0.25 mg by mouth 2 (two) times daily as needed (for anxiety). ) 20 tablet 0 Taking  . medroxyPROGESTERone (DEPO-PROVERA) 150 MG/ML injection Inject 150 mg into the muscle every 3 (three) months.     . Multiple Vitamin (MULTIVITAMIN WITH MINERALS) TABS tablet Take 1 tablet by mouth daily.   05/25/2017 at Unknown time  . naproxen (NAPROSYN) 500  MG tablet Take 500 mg by mouth 2 (two) times daily as needed (for pain.).     Marland Kitchen omega-3 acid ethyl esters (LOVAZA) 1 G capsule Take 1 g by mouth 2 (two) times daily.    05/25/2017 at Unknown time  . omeprazole (PRILOSEC) 20 MG capsule TAKE ONE TABLET BY MOUTH TWICE A DAY FOR 14 DAYS. THEN TAKE ONE TABLET BY MOUTH EVERY MORNING. 30 capsule 3   . atorvastatin (LIPITOR) 20 MG tablet Take 1 tablet (20 mg total) by mouth daily. 30 tablet 2   . calcium citrate (CALCITRATE - DOSED IN MG ELEMENTAL CALCIUM) 950 MG tablet Take 200 mg of elemental calcium by mouth 2 (two) times daily.   Unknown at Unknown time  . FLUoxetine (PROZAC) 20 MG tablet Take 1 tablet (20 mg total) by mouth  daily. (Patient not taking: Reported on 05/22/2017) 30 tablet 2 Not Taking at Unknown time  . Hydrocodone-Acetaminophen 10-300 MG TABS Take one tablet by mouth every 4-6 hours as needed for pain 20 each 0     ROS Neg  Blood pressure 124/64, pulse 77, temperature 98.9 F (37.2 C), temperature source Oral, resp. rate 16, last menstrual period 05/01/2017, SpO2 100 %. Physical Exam  Results for orders placed or performed during the hospital encounter of 05/26/17 (from the past 24 hour(s))  Pregnancy, urine     Status: None   Collection Time: 05/26/17  9:32 AM  Result Value Ref Range   Preg Test, Ur NEGATIVE NEGATIVE    Pelvic US 03/06/2017:  Uterus:  Measurements: 10.7 x 4.8 x 6.9 cm. Mildly heterogeneous echotexture.  Left posterolateral small subserosal uterine fibroid measures 2.7 x 1.9 x 2.4 cm.  Endometrium:  Thickness: 8 mm. No focal abnormality visualized.  Right ovary:  Measurements: 3.4 x 2.0 x 2.2 cm. Normal appearance/no adnexal mass.  Left ovary:  Measurements: 2.5 x 2.1 x 2.1 cm. Normal appearance/no adnexal mass.  No abnormal free fluid.   Assessment/Plan:  46 y.o. [id number]   1. Menometrorrhagia Persistent after 2 injections of DepoProvera and associated with severe pelvic cramps and pain.  Multiparous with h/o 4 SVD/1 C/S.  Decision to proceed with LAVH/Bilateral Salpingectomy.  Surgery and risks reviewed in details.  Preparation for procedure and Postop reviewed.  Will schedule asap.                        Patient was counseled as to the risk of surgery to include the following:  1. Infection (prohylactic antibiotics will be administered)  2. DVT/Pulmonary Embolism (prophylactic pneumo compression stockings will be used)  3.Trauma to internal organs requiring additional surgical procedure to repair any injury to     Internal organs requiring perhaps additional hospitalization days.  4.Hemmorhage requiring transfusion and blood products which carry risks such as  anaphylactic reaction, hepatitis and AIDS  Patient had received literature information on the procedure scheduled and all her questions were answered and fully accepts all risk.   2. Pelvic pain in female Probable Adenomyosis.  3. Degenerative disc disease at L5-S1 level Lower back pain.  4. Multiparous H/O 4 SVD, 1 C/S.ssessment/Plan:   Marie-Lyne Garvin Ellena 05/26/2017, 11:05 AM

## 2017-05-26 NOTE — Anesthesia Preprocedure Evaluation (Addendum)
Anesthesia Evaluation  Patient identified by MRN, date of birth, ID band Patient awake    Reviewed: Allergy & Precautions, NPO status , Patient's Chart, lab work & pertinent test results  Airway Mallampati: II  TM Distance: >3 FB Neck ROM: Full    Dental no notable dental hx.    Pulmonary neg pulmonary ROS,    Pulmonary exam normal breath sounds clear to auscultation       Cardiovascular negative cardio ROS Normal cardiovascular exam Rhythm:Regular Rate:Normal     Neuro/Psych  Headaches, PSYCHIATRIC DISORDERS Depression    GI/Hepatic negative GI ROS, Neg liver ROS,   Endo/Other  negative endocrine ROS  Renal/GU negative Renal ROS   persistant pelvic pain, menometrorrhagia, fibroids    Musculoskeletal  (+) Arthritis , Fibromyalgia -  Abdominal   Peds  Hematology negative hematology ROS (+)   Anesthesia Other Findings Hyperlipemia  Reproductive/Obstetrics                            Anesthesia Physical Anesthesia Plan  ASA: II  Anesthesia Plan: General   Post-op Pain Management:    Induction: Intravenous  PONV Risk Score and Plan: 4 or greater and Ondansetron, Dexamethasone, Midazolam and Scopolamine patch - Pre-op  Airway Management Planned: Oral ETT  Additional Equipment:   Intra-op Plan:   Post-operative Plan: Extubation in OR  Informed Consent: I have reviewed the patients History and Physical, chart, labs and discussed the procedure including the risks, benefits and alternatives for the proposed anesthesia with the patient or authorized representative who has indicated his/her understanding and acceptance.   Dental advisory given  Plan Discussed with: CRNA  Anesthesia Plan Comments:         Anesthesia Quick Evaluation

## 2017-05-26 NOTE — Op Note (Signed)
Operative Note  05/26/2017  1:45 PM  PATIENT:  Aimee Figueroa  46 y.o. female  PRE-OPERATIVE DIAGNOSIS:  persistant pelvic pain, menometrorrhagia, fibroids  POST-OPERATIVE DIAGNOSIS:  persistant pelvic pain, menometrorrhagia, fibroids, adhesions between uterus and anterior abdominal wall and anterior pelvic adhesions  PROCEDURE:  Procedure(s): LAPAROSCOPIC ASSISTED VAGINAL HYSTERECTOMY LAPAROSCOPIC BILATERAL SALPINGECTOMY AND LYSIS OF ADHESIONS  SURGEON:  Surgeon(s): [name], MD Fontaine, Belinda Block, MD  ANESTHESIA:   general  FINDINGS:  Uterus with fibroids, normal tubes and ovaries.  Anterior pelvic adhesions and Utero-anterior abdominal wall adhesions  DESCRIPTION OF OPERATION:  Under general anesthesia with endotracheal intubation the patient is in lithotomy position. She is prepped with DuraPrep on the abdomen and with Betadine on the suprapubic, vulvar and vaginal areas. The Foley is inserted in the bladder. The patient is draped as usual. A vaginal exam reveals an anteverted uterus slightly increased in size no adnexal mass. The speculum is inserted in the vagina and the anterior lip of the cervix is grasped with a tenaculum. A one tooth uterine cannula is inserted and the tenaculum is removed. The speculum is removed as well. We go to the abdomen. A 1.5 cm incision is done infraumbilically after infiltrating Marcaine one quarter plain. The skin was grasped with Allises. The aponeurosis is grasped with Coker clamps. The aponeurosis is opened under direct vision with Mayo scissors. The parietal peritoneum is opened bluntly with the finger. A pursestring stitch is put on the aponeurosis with of Vicryl 0. The Sheryle Hail is inserted at that level under direct vision and a pneumoperitoneum is created with CO2.  Inspection of the abdominopelvic cavities reveals a uterus with fibroids.  The anterior mid uterus is adherent to the anterior wall of the abdomen.  Both tubes and Ovaries are  normal to inspection.  Filmy adhesions are present in the anterior cul-de sac and between the bladder and uterus.  No lesion of endometriosis is seen.  Infiltration of Marcaine 1/4% plain at the right and left lower abdomen.  5 mm incisions made with the scalpel at both sites.  5 mm ports inserted under direct vision at both sites.  We use the Harmonic scalpel to first release the anterior adhesion between the uterus and the anterior abdominal wall.  We then cauterize and section the right mesosalpinx with the Harmonic scalpel, right utero-ovarian ligament and the right round ligament.  We lyse the adhesions in the anterior cul the sac.  We open the visceral peritoneum anteriorly and start lowering the bladder.  We proceed exactly the same way on the left side.  We cauterize the uterine arteries bilaterally high up to reduce the back flow.  Hemostasis is adequate.  The laparoscopic instruments are removed.  A sterile drape is added to over the abdomen.  We then go to vaginal time.        The weighted speculum is inserted in the vagina.  The cervix is grasped with 2 tenaculums.  Infiltration of the junction between the cervix and vagina with an Epinephrine/Lidocaine solution.  A circular incision is made with the scalpel at that level.  Retraction of the vagina with a sponge on the finger.  The visceral peritoneum is opened anteriorly.  The Deaver retractor is inserted at that level.  The vagina is retracted posteriorly and the visceral peritoneum is opened posteriorly.  The narrow weighted speculum is inserted at that level.  We clamp the Cardinal and Utero-sacral ligaments on the left side with a curved Heaney clamp, we section  with the Mayo scissors and suture with a Heaney stitch of Vicryl 0.  The stitch is kept on a Hemostat.  We proceed the same way on the right side.  We then clamp the left Uterine Artery with a Heaney clamp, section with Mayo scissors, suture with a Vicryl 0.  We proceed the same way on the  right side.  We continue up close to the uterus on the left and right side, clamping with a Heaney clamp, sectioning with the Mayo scissors and suturing with Vicryl 0 until we reach the top of the uterus and the Uterus with cervix and tubes is completely detached and sent to pathology.  Hemostasis is controled with a right angle on a small artery close to the cuff and sutured with a vicryl 0.  We then run the vaginal cuff with a vicryl 0 posteriorly, including the Uterosacral ligaments on each side.  The vaginal cuff is then closed with figures of eight.  Hemostasis is adequate.  All vaginal instruments are removed.  We go back to laparoscopy time.      The pneumoperitoneum is recreated with CO2.  The abdominopelvic cavities are irrigated and suctioned.  Hemostasis is confirmed at all levels.  All laparoscopic instruments are removed.  The ports are removed under direct vision.  The CO2 is evacuated.  The umbilical incision is closed by attaching the suture at the aponeurosis.  All incisions are closed with a Vicryl 4-0.  Dermabond is added on all incisions.  The patient is brought to recovery room in good and stable status.  ESTIMATED BLOOD LOSS: 450 cc   Intake/Output Summary (Last 24 hours) at 05/26/17 1345 Last data filed at 05/26/17 1342  Gross per 24 hour  Intake             2250 ml  Output              750 ml  Net             1500 ml     BLOOD ADMINISTERED:none   LOCAL MEDICATIONS USED:  MARCAINE     SPECIMEN:  Source of Specimen:  Uterus with cervix and bilateral tubes  DISPOSITION OF SPECIMEN:  PATHOLOGY  COUNTS:  YES  PLAN OF CARE: Transfer to PACU  Marie-Lyne LavoieMD1:45 PM

## 2017-05-26 NOTE — Transfer of Care (Signed)
Immediate Anesthesia Transfer of Care Note  Patient: Rokia Bosket  Procedure(s) Performed: Procedure(s): LAPAROSCOPIC ASSISTED VAGINAL HYSTERECTOMY (N/A) LAPAROSCOPIC BILATERAL SALPINGECTOMY (Bilateral)  Patient Location: PACU  Anesthesia Type:General  Level of Consciousness: sedated  Airway & Oxygen Therapy: Patient Spontanous Breathing and Patient connected to nasal cannula oxygen  Post-op Assessment: Report given to RN  Post vital signs: Reviewed and stable  Last Vitals:  Vitals:   05/26/17 0937  BP: 124/64  Pulse: 77  Resp: 16  Temp: 37.2 C  SpO2: 100%    Last Pain:  Vitals:   05/26/17 0937  TempSrc: Oral      Patients Stated Pain Goal: 4 (64/15/83 0940)  Complications: No apparent anesthesia complications

## 2017-05-26 NOTE — Anesthesia Procedure Notes (Signed)
Procedure Name: Intubation Date/Time: 05/26/2017 11:20 AM Performed by: Hewitt Blade Pre-anesthesia Checklist: Patient identified, Emergency Drugs available, Suction available and Patient being monitored Patient Re-evaluated:Patient Re-evaluated prior to induction Oxygen Delivery Method: Circle system utilized Preoxygenation: Pre-oxygenation with 100% oxygen Induction Type: IV induction Ventilation: Mask ventilation without difficulty Laryngoscope Size: Mac and 3 Grade View: Grade II Tube type: Oral Tube size: 7.0 mm Number of attempts: 1 Airway Equipment and Method: Stylet Placement Confirmation: ETT inserted through vocal cords under direct vision,  positive ETCO2 and breath sounds checked- equal and bilateral Secured at: 22 cm Tube secured with: Tape Dental Injury: Teeth and Oropharynx as per pre-operative assessment  Comments: ETT ex-changed over a bougie due to a possible leak in cuff. +BBS, VSS.

## 2017-05-26 NOTE — Anesthesia Postprocedure Evaluation (Signed)
Anesthesia Post Note  Patient: Aimee Figueroa  Procedure(s) Performed: Procedure(s) (LRB): LAPAROSCOPIC ASSISTED VAGINAL HYSTERECTOMY (N/A) LAPAROSCOPIC BILATERAL SALPINGECTOMY (Bilateral)     Patient location during evaluation: PACU Anesthesia Type: General Level of consciousness: awake and alert Pain management: pain level not controlled Vital Signs Assessment: post-procedure vital signs reviewed and stable Respiratory status: spontaneous breathing, nonlabored ventilation, respiratory function stable and patient connected to nasal cannula oxygen Cardiovascular status: blood pressure returned to baseline and stable Postop Assessment: no signs of nausea or vomiting Anesthetic complications: no    Last Vitals:  Vitals:   05/26/17 1630 05/26/17 1645  BP:  (!) 92/45  Pulse:  73  Resp:    Temp: 36.8 C 37.1 C  SpO2:  97%    Last Pain:  Vitals:   05/26/17 1645  TempSrc:   PainSc: 6    Pain Goal: Patients Stated Pain Goal: 4 (05/26/17 1645)               Ryan P Ellender

## 2017-05-27 ENCOUNTER — Encounter (HOSPITAL_COMMUNITY): Payer: Self-pay | Admitting: Obstetrics & Gynecology

## 2017-05-27 LAB — CBC
HEMATOCRIT: 28.6 % — AB (ref 36.0–46.0)
HEMOGLOBIN: 10 g/dL — AB (ref 12.0–15.0)
MCH: 30.9 pg (ref 26.0–34.0)
MCHC: 35 g/dL (ref 30.0–36.0)
MCV: 88.3 fL (ref 78.0–100.0)
Platelets: 138 10*3/uL — ABNORMAL LOW (ref 150–400)
RBC: 3.24 MIL/uL — ABNORMAL LOW (ref 3.87–5.11)
RDW: 17.1 % — ABNORMAL HIGH (ref 11.5–15.5)
WBC: 7.5 10*3/uL (ref 4.0–10.5)

## 2017-05-27 MED ORDER — HYDROMORPHONE HCL 2 MG PO TABS: 2.0000 mg | ORAL_TABLET | ORAL | 0 refills | Status: DC | PRN

## 2017-05-27 NOTE — Addendum Note (Signed)
Addendum  created 05/27/17 0941 by Jonna Munro, CRNA   Sign clinical note

## 2017-05-27 NOTE — Discharge Summary (Signed)
Physician Discharge Summary  Patient ID: Aimee Figueroa MRN: [id number] DOB/AGE: 1971/06/14 46 y.o.  Admit date: 05/26/2017 Discharge date: 05/27/2017  Admission Diagnoses: persistant pelvic pain, menometrorrhagia, fibroids   Discharge Diagnoses: persistant pelvic pain, menometrorrhagia, fibroids, adhesions Active Problems:   Postoperative state   Discharged Condition: good  Consults:None  Significant diagnostic studies: None  Treatments:surgery: Laparoscopic Total Vaginal Hysterectomy with Bilateral Salpingectomy and Lysis of Adhesions  Vitals:   05/27/17 0416 05/27/17 0800  BP: (!) 94/43 (!) 99/51  Pulse: 82 76  Resp: 18 18  Temp: 98.7 F (37.1 C) 99.2 F (37.3 C)  SpO2: 98% 98%     Total I/O In: 229.2 [I.V.:229.2] Out: -    Hospital Course:  Good  Discharge Exam:  Normal postop  Disposition: 01-Home or Self Care    Allergies as of 05/27/2017      Reactions   Tylenol [acetaminophen] Rash      Medication List    STOP taking these medications   Hydrocodone-Acetaminophen 10-300 MG Tabs     TAKE these medications   amoxicillin 500 MG tablet Commonly known as:  AMOXIL Take 2 tablets (1,000 mg total) by mouth 2 (two) times daily.   atorvastatin 20 MG tablet Commonly known as:  LIPITOR Take 1 tablet (20 mg total) by mouth daily.   calcium citrate 950 MG tablet Commonly known as:  CALCITRATE - dosed in mg elemental calcium Take 200 mg of elemental calcium by mouth 2 (two) times daily.   clarithromycin 500 MG tablet Commonly known as:  BIAXIN Take 1 tablet (500 mg total) by mouth 2 (two) times daily.   FLUoxetine 20 MG tablet Commonly known as:  PROZAC Take 1 tablet (20 mg total) by mouth daily.   HYDROmorphone 2 MG tablet Commonly known as:  DILAUDID Take 1 tablet (2 mg total) by mouth every 4 (four) hours as needed for severe pain.   ibuprofen 600 MG tablet Commonly known as:  ADVIL,MOTRIN Take 1 tablet (600 mg total) by mouth every 8 (eight)  hours as needed. What changed:  reasons to take this   ibuprofen 800 MG tablet Commonly known as:  ADVIL,MOTRIN Take 1 tablet (800 mg total) by mouth every 8 (eight) hours as needed. What changed:  Another medication with the same name was changed. Make sure you understand how and when to take each.   LORazepam 0.5 MG tablet Commonly known as:  ATIVAN Take 1/2 to 1 tablet twice daily as needed for anxiety. What changed:  how much to take  how to take this  when to take this  reasons to take this  additional instructions   medroxyPROGESTERone 150 MG/ML injection Commonly known as:  DEPO-PROVERA Inject 150 mg into the muscle every 3 (three) months.   multivitamin with minerals Tabs tablet Take 1 tablet by mouth daily.   naproxen 500 MG tablet Commonly known as:  NAPROSYN Take 500 mg by mouth 2 (two) times daily as needed (for pain.).   omega-3 acid ethyl esters 1 g capsule Commonly known as:  LOVAZA Take 1 g by mouth 2 (two) times daily.   omeprazole 20 MG capsule Commonly known as:  PRILOSEC TAKE ONE TABLET BY MOUTH TWICE A DAY FOR 14 DAYS. THEN TAKE ONE TABLET BY MOUTH EVERY MORNING.   Vitamin D3 2000 units Tabs Take 2,000 Units by mouth daily.            Discharge Care Instructions        Start  Ordered   05/27/17 0000  HYDROmorphone (DILAUDID) 2 MG tablet  Every 4 hours PRN     05/27/17 9163       Follow-up Information    [name], MD In 3 weeks.   Specialty:  Obstetrics and Gynecology Contact information: [address] [place] [address] [phone]            Signed: [name] 05/27/2017, 9:30 AM

## 2017-05-27 NOTE — Progress Notes (Signed)
Discharge teaching complete with pt. Pt understood all information and did not have any questions. Pt pushed via wheelchair and discharged home to family.  

## 2017-05-27 NOTE — Progress Notes (Signed)
POD#1 LAVH/Bilateral Salpingectomy, Lysis of Adhesions  Subjective: Patient reports tolerating PO and no problems voiding.    Objective: I have reviewed patient's vital signs.  vital signs, intake and output, medications and labs.  Vitals:   05/27/17 0416 05/27/17 0800  BP: (!) 94/43 (!) 99/51  Pulse: 82 76  Resp: 18 18  Temp: 98.7 F (37.1 C) 99.2 F (37.3 C)  SpO2: 98% 98%   I/O last 3 completed shifts: In: 4020.4 [P.O.:60; I.V.:3710.4; IV Piggyback:250] Out: 1308 [Urine:1300; Blood:450] Total I/O In: 229.2 [I.V.:229.2] Out: -   Results for orders placed or performed during the hospital encounter of 05/26/17 (from the past 24 hour(s))  Pregnancy, urine     Status: None   Collection Time: 05/26/17  9:32 AM  Result Value Ref Range   Preg Test, Ur NEGATIVE NEGATIVE  CBC     Status: Abnormal   Collection Time: 05/27/17  5:19 AM  Result Value Ref Range   WBC 7.5 4.0 - 10.5 K/uL   RBC 3.24 (L) 3.87 - 5.11 MIL/uL   Hemoglobin 10.0 (L) 12.0 - 15.0 g/dL   HCT 28.6 (L) 36.0 - 46.0 %   MCV 88.3 78.0 - 100.0 fL   MCH 30.9 26.0 - 34.0 pg   MCHC 35.0 30.0 - 36.0 g/dL   RDW 17.1 (H) 11.5 - 15.5 %   Platelets 138 (L) 150 - 400 K/uL    EXAM General: alert and cooperative Resp: clear to auscultation bilaterally Cardio: regular rate and rhythm GI: soft, non-tender; bowel sounds normal; no masses,  no organomegaly and incision: clean, dry and intact Extremities: no edema, redness or tenderness in the calves or thighs Vaginal Bleeding: none  Assessment: s/p Procedure(s): LAPAROSCOPIC ASSISTED VAGINAL HYSTERECTOMY LAPAROSCOPIC BILATERAL SALPINGECTOMY, LYSIS OF ADHESIONS: progressing well and tolerating diet  Plan: Advance diet Encourage ambulation Discharge home  LOS: 0 days    [name], MD 05/27/2017 9:28 AM    05/27/2017, 9:28 AM

## 2017-05-27 NOTE — Anesthesia Postprocedure Evaluation (Signed)
Anesthesia Post Note  Patient: Aimee Figueroa  Procedure(s) Performed: Procedure(s) (LRB): LAPAROSCOPIC ASSISTED VAGINAL HYSTERECTOMY (N/A) LAPAROSCOPIC BILATERAL SALPINGECTOMY (Bilateral)     Patient location during evaluation: [facility] Anesthesia Type: General Level of consciousness: awake and alert and oriented Pain management: pain level controlled Vital Signs Assessment: post-procedure vital signs reviewed and stable Respiratory status: spontaneous breathing and respiratory function stable Cardiovascular status: stable Postop Assessment: no signs of nausea or vomiting and adequate PO intake Anesthetic complications: no    Last Vitals:  Vitals:   05/27/17 0416 05/27/17 0800  BP: (!) 94/43 (!) 99/51  Pulse: 82 76  Resp: 18 18  Temp: 37.1 C 37.3 C  SpO2: 98% 98%    Last Pain:  Vitals:   05/27/17 0800  TempSrc: Oral  PainSc:    Pain Goal: Patients Stated Pain Goal: 4 (05/27/17 0730)               Willa Rough

## 2017-05-27 NOTE — Discharge Instructions (Signed)
Laparoscopically Assisted Vaginal Hysterectomy, Care After °Refer to this sheet in the next few weeks. These instructions provide you with information on caring for yourself after your procedure. Your health care provider may also give you more specific instructions. Your treatment has been planned according to current medical practices, but problems sometimes occur. Call your health care provider if you have any problems or questions after your procedure. °What can I expect after the procedure? °After your procedure, it is typical to have the following: °· Abdominal pain. You will be given pain medicine to control it. °· Sore throat from the breathing tube that was inserted during surgery. ° °Follow these instructions at home: °· Only take over-the-counter or prescription medicines for pain, discomfort, or fever as directed by your health care provider. °· Do not take aspirin. It can cause bleeding. °· Do not drive when taking pain medicine. °· Follow your health care provider's advice regarding diet, exercise, lifting, driving, and general activities. °· Resume your usual diet as directed and allowed. °· Get plenty of rest and sleep. °· Do not douche, use tampons, or have sexual intercourse for at least 6 weeks, or until your health care provider gives you permission. °· Change your bandages (dressings) as directed by your health care provider. °· Monitor your temperature and notify your health care provider of a fever. °· Take showers instead of baths for 2-3 weeks. °· Do not drink alcohol until your health care provider gives you permission. °· If you develop constipation, you may take a mild laxative with your health care provider's permission. Bran foods may help with constipation problems. Drinking enough fluids to keep your urine clear or pale yellow may help as well. °· Try to have someone home with you for 1-2 weeks to help around the house. °· Keep all of your follow-up appointments as directed by your  health care provider. °Contact a health care provider if: °· You have swelling, redness, or increasing pain around your incision sites. °· You have pus coming from your incision. °· You notice a bad smell coming from your incision. °· Your incision breaks open. °· You feel dizzy or lightheaded. °· You have pain or bleeding when you urinate. °· You have persistent diarrhea. °· You have persistent nausea and vomiting. °· You have abnormal vaginal discharge. °· You have a rash. °· You have any type of abnormal reaction or develop an allergy to your medicine. °· You have poor pain control with your prescribed medicine. °Get help right away if: °· You have a fever. °· You have severe abdominal pain. °· You have chest pain. °· You have shortness of breath. °· You faint. °· You have pain, swelling, or redness in your leg. °· You have heavy vaginal bleeding with blood clots. °This information is not intended to replace advice given to you by your health care provider. Make sure you discuss any questions you have with your health care provider. °Document Released: 09/04/2011 Document Revised: 02/21/2016 Document Reviewed: 03/31/2013 °Elsevier Interactive Patient Education © 2017 [employer] ° °

## 2017-05-28 ENCOUNTER — Ambulatory Visit: Payer: Self-pay | Admitting: Obstetrics & Gynecology

## 2017-06-02 ENCOUNTER — Ambulatory Visit: Payer: Self-pay

## 2017-06-04 ENCOUNTER — Telehealth: Payer: Self-pay

## 2017-06-04 NOTE — Telephone Encounter (Signed)
Patient called complaining of pain. Had surgery 05/26/17. She said pain is pelvic pain. She said she did not take her pain medicine because she thought she didn't need it and her pain was bad 7-8 (on 1-10 scale.) She took her pain meds and reduced to 4-5.  She does not note any fever or any UTI sx.  She has had a little pink discharge.  She is scheduled to see you for visit tomorrow at 1:30pm.

## 2017-06-05 ENCOUNTER — Ambulatory Visit: Payer: Self-pay | Admitting: Obstetrics & Gynecology

## 2017-06-08 ENCOUNTER — Ambulatory Visit (INDEPENDENT_AMBULATORY_CARE_PROVIDER_SITE_OTHER): Payer: Self-pay | Admitting: Obstetrics & Gynecology

## 2017-06-08 ENCOUNTER — Encounter: Payer: Self-pay | Admitting: Obstetrics & Gynecology

## 2017-06-08 ENCOUNTER — Telehealth: Payer: Self-pay | Admitting: *Deleted

## 2017-06-08 VITALS — BP 128/86

## 2017-06-08 DIAGNOSIS — N898 Other specified noninflammatory disorders of vagina: Secondary | ICD-10-CM

## 2017-06-08 DIAGNOSIS — Z09 Encounter for follow-up examination after completed treatment for conditions other than malignant neoplasm: Secondary | ICD-10-CM

## 2017-06-08 LAB — WET PREP FOR TRICH, YEAST, CLUE

## 2017-06-08 MED ORDER — TINIDAZOLE 500 MG PO TABS
2.0000 g | ORAL_TABLET | Freq: Every day | ORAL | 0 refills | Status: DC
Start: 2017-06-08 — End: 2017-06-08

## 2017-06-08 MED ORDER — TINIDAZOLE 500 MG PO TABS: ORAL_TABLET | ORAL | 0 refills | Status: DC

## 2017-06-08 NOTE — Telephone Encounter (Signed)
Ok will see patient today.  May need a Pelvic US.  Will see patient first.

## 2017-06-08 NOTE — Patient Instructions (Signed)
1. Follow-up examination after gynecological surgery Good post op healing.  No evidence of complication.  Can increase activities, but no IC until >6 wks postop.  F/U in 4 weeks.  2. Vaginal discharge Bacterial Vaginosis present.  Tinidazole prescribed.  Usage explained.   - WET PREP FOR [place], YEAST, [place], good to see you today!

## 2017-06-08 NOTE — Progress Notes (Signed)
    Aimee Figueroa Sep 08, 1971 [id number]        46 y.o.  [id number]  RP: 2 wk Postop visit LAVH  HPI:  Doing well.  Still feeling pelvic discomfort, but improving.  No vaginal bleeding.  Mild vaginal d/c.  No fever.  Mictions/BMs wnl.  Past medical history,surgical history, problem list, medications, allergies, family history and social history were all reviewed and documented in the EPIC chart.  Directed ROS with pertinent positives and negatives documented in the history of present illness/assessment and plan.  Exam:  Vitals:   06/08/17 1128  BP: 128/86   General appearance:  Normal  Abdo:  Soft, NT, no distention.  Incisions healing well.  Gyn exam:  Vulva normal                     Speculum Vaginal vault intact.  Mild increase vaginal d/c, wet prep done.                     Bimanual exam:  No pelvic mass or bulging.  Mild normal tenderness for postop.  Assessment/Plan:  46 y.o. [id number]   1. Follow-up examination after gynecological surgery Good post op healing.  No evidence of complication.  Can increase activities, but no IC until >6 wks postop.  F/U in 4 weeks.  2. Vaginal discharge Bacterial Vaginosis present.  Tinidazole prescribed.  Usage explained.   - WET PREP FOR [place], YEAST, CLUE  [name] MD, 12:04 PM 06/08/2017

## 2017-06-08 NOTE — Telephone Encounter (Signed)
Rx for tindamax 500mg  has 2 set of directions on Rx, I sent new Rx with take 2 tablet po twice a day for 2 days as Dr.Lavoie noted.

## 2017-06-10 ENCOUNTER — Ambulatory Visit: Payer: Self-pay | Admitting: Gastroenterology

## 2017-06-10 ENCOUNTER — Telehealth: Payer: Self-pay | Admitting: Gynecology

## 2017-06-10 NOTE — Telephone Encounter (Signed)
On Call Note:  Patient notes bright red discharge tonight.  Having brownish discharge previously but now bright red.  S/P LAVH 2 weeks ago with follow up exam normal 2 days ago.  No pain, odor, irritation, UTI symptoms, fever or chills.  Eating, drinking, voiding, BM's without difficulty.  Options for ER eval tonight vs obs with follow up in office if continues.  Patient prefers obs for now. OV if continues. ASAP call precautions reviewed.

## 2017-06-11 ENCOUNTER — Telehealth: Payer: Self-pay | Admitting: *Deleted

## 2017-06-11 NOTE — Telephone Encounter (Signed)
Pt called to follow up from speaking with Dr.Fontaine telephone encounter on 06/10/17, states bleeding has stopped and pain is less.

## 2017-06-15 ENCOUNTER — Ambulatory Visit: Payer: Self-pay | Admitting: Licensed Clinical Social Worker

## 2017-06-15 ENCOUNTER — Telehealth: Payer: Self-pay | Admitting: Gastroenterology

## 2017-06-15 NOTE — Telephone Encounter (Signed)
Patient had laparoscopic hysterectomy /28/18. She is calling today with left upper quadrant pain. Eating does not improve the pain. Feels constipated but says she has taken something for that. She has been taking Ibuprofen 800 mg every 8 hours for pain. Appointment scheduled for evaluation.

## 2017-06-16 ENCOUNTER — Ambulatory Visit: Payer: Self-pay | Admitting: Licensed Clinical Social Worker

## 2017-06-16 ENCOUNTER — Encounter: Payer: Self-pay | Admitting: Physician Assistant

## 2017-06-16 ENCOUNTER — Ambulatory Visit (INDEPENDENT_AMBULATORY_CARE_PROVIDER_SITE_OTHER): Payer: Self-pay | Admitting: Physician Assistant

## 2017-06-16 ENCOUNTER — Encounter: Payer: Self-pay | Admitting: Genetics

## 2017-06-16 VITALS — BP 122/68 | HR 66 | Ht 61.0 in | Wt 146.5 lb

## 2017-06-16 DIAGNOSIS — R12 Heartburn: Secondary | ICD-10-CM

## 2017-06-16 DIAGNOSIS — R1012 Left upper quadrant pain: Secondary | ICD-10-CM

## 2017-06-16 MED ORDER — OMEPRAZOLE 40 MG PO CPDR: 40.0000 mg | DELAYED_RELEASE_CAPSULE | Freq: Two times a day (BID) | ORAL | 2 refills | Status: DC

## 2017-06-16 NOTE — Patient Instructions (Addendum)
Please call the office with an update of your symptoms in the next one week and ask for Katina Degree, RN.   We have sent the following medications to your pharmacy for you to pick up at your convenience: Omeprazole 40 mg twice a day 30-60 mins before lunch and dinner.   Limit the use of your NSAIDs.

## 2017-06-16 NOTE — Progress Notes (Signed)
Chief Complaint: Abdominal Pain  HPI:  [name] is a 46 year old Hispanic female with a past medical history of fibromyalgia and others listed below,  who was referred to me by Alfonse Spruce, F* for a complaint of abdominal pain.   Patient is followed by Dr. Silverio Decamp and was last seen in our clinic 05/19/17 with ongoing complaints of abdominal pain. She was initially seen 04/30/17 with complaints of a 3 month history of primary left-sided abdominal pain, pelvic pain and rectal pressure. She underwent CT of the abdomen and pelvis which was negative, MRI showed L5-S1 disc disease. She had a colonoscopy 05/08/17 with one 7 mm polyp removed in the sigmoid colon which was a tubular adenoma as well as a few sigmoid diverticuli and internal hemorrhoids. She also underwent GYN evaluation and was diagnosed with uterine adenomyosis and a small fibroid. Her symptoms did not improve with birth control pills and patient was planned for surgery for hysterectomy and BSO. At time of last visit, her complaints were still of lower abdominal and pelvic discomfort. Patient was continued on Omeprazole 20 mg once daily and asked to limit Naproxen use as much as possible. She was also given a short-term prescription for Ativan 0.5 mg and told to start with one half tablet by mouth twice a day when necessary for anxiety.   Since her last appointment patient underwent a laparoscopic assisted vaginal hysterectomy on 05/26/17.   Patient called our clinic yesterday 06/15/17 with a complaint of left upper quadrant pain which was not improved with eating. Patient also described feeling constipated but was taking something for that. She did admit to taking Ibuprofen 800 mg every 8 hours for pain related to her recent gynecologic surgery.     Today, the patient presents to clinic and explains that she continues with a left upper quadrant abdominal pain. She describes that everyone was hoping this pain would go away once she had her  hysterectomy, but this happened 3 weeks ago and she has continued with this constant pain which is sometimes worse than others. Eating tends to make this pain worse and almost immediately after she swallows the patient describes "rumbling and noises" in there and a 5/10 burning pain. Sometimes this is increased to a 6-7/10. Patient also describes a lot of burping and gas whenever she eats or drinks. She does admit to using Ibuprofen 800 mg sometimes multiple times per day prior to her recent hysterectomy and even after. She stopped her pain medicines 2 days ago and has been using over-the-counter Advil 400 mg as needed. Patient remembers being on Omeprazole 20 mg a while ago as prescribed by our clinic, but is unsure if this really made any difference to her symptoms. She is not currently on this medication.   Of note patient does become somewhat tearful during time of interview today and explains that she has 5 children at home and has to be around for them.   Patient denies fever, chills, blood in her stool, melena, weight loss, fatigue, anorexia, nausea, vomiting, heartburn or reflux.    Past Medical History:  Diagnosis Date  . Abnormal Pap smear   . Depression   . Fibromyalgia   . Hyperlipemia   . Low blood pressure   . Pyloric ulcer associated with Helicobacter pylori, acute 05/22/2017   patient not sure if she has an ulcer - first dx  . SVD (spontaneous vaginal delivery)    x 4  . Tuberculosis    46 yrs  old    Past Surgical History:  Procedure Laterality Date  . CESAREAN SECTION    . COLONOSCOPY    . GYNECOLOGIC CRYOSURGERY    . LAPAROSCOPIC ASSISTED VAGINAL HYSTERECTOMY N/A 05/26/2017   Procedure: LAPAROSCOPIC ASSISTED VAGINAL HYSTERECTOMY;  Surgeon: [name], MD;  Location: [facility];  Service: Gynecology;  Laterality: N/A;  . LAPAROSCOPIC BILATERAL SALPINGECTOMY Bilateral 05/26/2017   Procedure: LAPAROSCOPIC BILATERAL SALPINGECTOMY;  Surgeon: [name], MD;   Location: [facility];  Service: Gynecology;  Laterality: Bilateral;    Current Outpatient Prescriptions  Medication Sig Dispense Refill  . amoxicillin (AMOXIL) 500 MG tablet Take 2 tablets (1,000 mg total) by mouth 2 (two) times daily. 56 tablet 0  . atorvastatin (LIPITOR) 20 MG tablet Take 1 tablet (20 mg total) by mouth daily. 30 tablet 2  . calcium citrate (CALCITRATE - DOSED IN MG ELEMENTAL CALCIUM) 950 MG tablet Take 200 mg of elemental calcium by mouth 2 (two) times daily.    . Cholecalciferol (VITAMIN D3) 2000 units TABS Take 2,000 Units by mouth daily.    Marland Kitchen HYDROmorphone (DILAUDID) 2 MG tablet Take 1 tablet (2 mg total) by mouth every 4 (four) hours as needed for severe pain. 30 tablet 0  . ibuprofen (ADVIL,MOTRIN) 600 MG tablet Take 1 tablet (600 mg total) by mouth every 8 (eight) hours as needed. (Patient taking differently: Take 600 mg by mouth every 8 (eight) hours as needed (for pain.). ) 30 tablet 0  . ibuprofen (ADVIL,MOTRIN) 800 MG tablet Take 1 tablet (800 mg total) by mouth every 8 (eight) hours as needed. 30 tablet 0  . Multiple Vitamin (MULTIVITAMIN WITH MINERALS) TABS tablet Take 1 tablet by mouth daily.    . naproxen (NAPROSYN) 500 MG tablet Take 500 mg by mouth 2 (two) times daily as needed (for pain.).     No current facility-administered medications for this visit.     Allergies as of 06/16/2017 - Review Complete 06/16/2017  Allergen Reaction Noted  . Tylenol [acetaminophen] Rash 09/17/2011    Family History  Problem Relation Age of Onset  . Diabetes Father   . Hyperlipidemia Father   . Hyperlipidemia Mother   . Cancer Paternal Aunt        uterine  . Ulcerative colitis Cousin   . Anesthesia problems Neg Hx   . Colon cancer Neg Hx   . Rectal cancer Neg Hx   . Esophageal cancer Neg Hx   . Liver cancer Neg Hx     Social History   Social History  . Marital status: Married    Spouse name: N/A  . Number of children: 5  . Years of education: N/A    Occupational History  . homemaker    Social History Main Topics  . Smoking status: Never Smoker  . Smokeless tobacco: Never Used  . Alcohol use No  . Drug use: No  . Sexual activity: Yes    Partners: Male    Birth control/ protection: Injection     Comment: depo provera 05/13/17   Other Topics Concern  . Not on file   Social History Narrative  . No narrative on file    Review of Systems:    Constitutional: No weight loss, fever or chills Cardiovascular: No chest pain  Respiratory: No SOB  Gastrointestinal: See HPI and otherwise negative   Physical Exam:  Vital signs: BP 122/68   Pulse 66   Ht 5\' 1"  (1.549 m)   Wt 146 lb 8 oz (66.5 kg)  LMP 05/01/2017   BMI 27.68 kg/m    Constitutional:   Pleasant Hispanic female appears to be in NAD, Well developed, Well nourished, alert and cooperative Respiratory: Respirations even and unlabored. Lungs clear to auscultation bilaterally.   No wheezes, crackles, or rhonchi.  Cardiovascular: Normal S1, S2. No MRG. Regular rate and rhythm. No peripheral edema, cyanosis or pallor.  Gastrointestinal:  Soft, nondistended, moderate LUQ/Epigastric ttp No rebound or guarding. Normal bowel sounds. No appreciable masses or hepatomegaly. Healing surgical scars Psychiatric: Demonstrates good judgement and reason without abnormal affect or behaviors.  MOST RECENT LABS AND IMAGING: CBC    Component Value Date/Time   WBC 7.5 05/27/2017 0519   RBC 3.24 (L) 05/27/2017 0519   HGB 10.0 (L) 05/27/2017 0519   HGB 13.7 05/18/2017 1147   HCT 28.6 (L) 05/27/2017 0519   HCT 40.8 05/18/2017 1147   PLT 138 (L) 05/27/2017 0519   PLT 168 05/18/2017 1147   MCV 88.3 05/27/2017 0519   MCV 89 05/18/2017 1147   MCH 30.9 05/27/2017 0519   MCHC 35.0 05/27/2017 0519   RDW 17.1 (H) 05/27/2017 0519   RDW 18.8 (H) 05/18/2017 1147   LYMPHSABS 1.6 05/18/2017 1147   MONOABS 306 03/25/2017 1547   EOSABS 0.1 05/18/2017 1147   BASOSABS 0.0 05/18/2017 1147     CMP     Component Value Date/Time   NA 142 05/18/2017 1147   K 4.0 05/18/2017 1147   CL 104 05/18/2017 1147   CO2 23 05/18/2017 1147   GLUCOSE 108 (H) 05/18/2017 1147   GLUCOSE 96 04/07/2017 1353   GLUCOSE 80 01/06/2012 1131   BUN 6 05/18/2017 1147   CREATININE 0.53 (L) 05/18/2017 1147   CREATININE 0.51 05/31/2015 1151   CALCIUM 10.2 05/18/2017 1147   PROT 7.4 05/18/2017 1147   ALBUMIN 4.7 05/18/2017 1147   AST 26 05/18/2017 1147   ALT 40 (H) 05/18/2017 1147   ALKPHOS 50 05/18/2017 1147   BILITOT 0.4 05/18/2017 1147   GFRNONAA 115 05/18/2017 1147   GFRNONAA >89 09/06/2014 1551   GFRAA 133 05/18/2017 1147   GFRAA >89 09/06/2014 1551    Assessment: 1. Left upper quadrant abdominal pain: Immediately after eating, recent use of a large amount of NSAIDs around time of hysterectomy; Consider PUD versus gastritis versus other 2. L5-S1 disease: Could be contributing to abdominal pain, though patient is describing this as left upper quadrant discomfort  Plan: 1. Discussed with the patient today that I believe she has a gastritis/reflux, certainly exacerbated by recent NSAID use. 2. We discussed possible EGD, though I would like to wait on another anesthetic procedure as patient has had 2 in the recent months. 3. Prescribed Omeprazole 40 mg twice a day 30 days #60 with one refill 4. We will call patient in a week, if she has had no improvement of her symptoms we will schedule her for an EGD with Dr. Silverio Decamp. 5. Recommend the patient discontinue her use of NSAIDs or certainly limit them 6. Reviewed antireflux diet and lifestyle modifications 7. Patient to return to clinic in 2-3 weeks with Dr. Silverio Decamp, if not already scheduled for EGD  Ellouise Newer, PA-C [facility] 06/16/2017, 1:16 PM  Cc: Alfonse Spruce, F*

## 2017-06-17 NOTE — Progress Notes (Signed)
Reviewed and agree with documentation and assessment and plan. K. Veena Tynetta Bachmann , MD   

## 2017-06-18 ENCOUNTER — Ambulatory Visit: Payer: Self-pay | Attending: Family Medicine | Admitting: Licensed Clinical Social Worker

## 2017-06-18 DIAGNOSIS — Z09 Encounter for follow-up examination after completed treatment for conditions other than malignant neoplasm: Secondary | ICD-10-CM | POA: Insufficient documentation

## 2017-06-18 DIAGNOSIS — F329 Major depressive disorder, single episode, unspecified: Secondary | ICD-10-CM | POA: Insufficient documentation

## 2017-06-18 DIAGNOSIS — F411 Generalized anxiety disorder: Secondary | ICD-10-CM

## 2017-06-18 DIAGNOSIS — F419 Anxiety disorder, unspecified: Secondary | ICD-10-CM | POA: Insufficient documentation

## 2017-06-18 NOTE — BH Specialist Note (Signed)
Integrated Behavioral Health Follow Up Visit  MRN: [id number] Name: Aimee Figueroa  Number of [place] Clinician visits: 2/6 Session Start time: 11:30 AM  Session End time: 12:30 PM Total time: 1 hour  Type of Service: [place] Interpretor:No. Interpretor Name and Language: N/A  SUBJECTIVE: Aimee Figueroa is a 46 y.o. female accompanied by self Patient was referred by FNP [place] for anxiety and depression. Patient reports the following symptoms/concerns: feelings of sadness, guilt about parenting skills, and worry about health, and low energy  Duration of problem: Ongoing Pt self reports postpartum depression with all of her children; Severity of problem: moderate  OBJECTIVE: Mood: Anxious and Affect: Appropriate and Tearful Risk of harm to self or others: No plan to harm self or others  LIFE CONTEXT: Family and Social: Pt resides with spouse and four minor children. She has an adult child who is out of the home. Pt shared that she is unable to talk to spouse about mental health because "he doesn't understand" School/Work: Pt's spouse is employed. She stays home with the children. The family does not receive any public benefits. Pt has an orange card. Self-Care: Pt is open to medication management. She attends church, prays, and reads the bible to cope with stressors. Life Changes: Pt recently had a hysterectomy. She is still experiencing abdominal discomfort  GOALS ADDRESSED: Patient will: 1.  Reduce symptoms of: anxiety and depression  2.  Increase knowledge and/or ability of: coping skills  3.  Demonstrate ability to: Increase healthy adjustment to current life circumstances and Increase adequate support systems for patient/family  INTERVENTIONS: Interventions utilized:  Mindfulness or Relaxation Training and Supportive Counseling Standardized Assessments completed: Not Needed  ASSESSMENT: Patient currently experiencing  anxiety and depression triggered by health concerns. She receives limited emotional support from spouse.   Patient may benefit from psychotherapy. She is participating in medication management through PCP. Patient was educated on how identifying rational and irrational thoughts can decrease symptoms and assist in managing anxiety. LCSWA discussed therapeutic interventions pt can utilize to challenge anxious/negative thoughts.   PLAN: 1. Follow up with behavioral health clinician on : Pt was encouraged tocontact LCSWA if symptoms worsen or fail to improveto schedule behavioral appointments at [facility]. 2. Behavioral recommendations: LCSWA recommends that pt apply healthy coping skills discussed, comply with medication management, and utilize provided resources. Pt is encouraged to schedule follow up appointment with LCSWA 3. Referral(s): [place] (In Clinic) 4. "From scale of 1-10, how likely are you to follow plan?": 9/10  Rebekah Chesterfield, LCSW 06/22/17 10:56 AM

## 2017-06-22 NOTE — Telephone Encounter (Signed)
Appointment 07/16/17 @ 1:00pm

## 2017-06-24 ENCOUNTER — Telehealth: Payer: Self-pay | Admitting: Physician Assistant

## 2017-06-24 ENCOUNTER — Ambulatory Visit (INDEPENDENT_AMBULATORY_CARE_PROVIDER_SITE_OTHER): Payer: Self-pay | Admitting: Orthopaedic Surgery

## 2017-06-24 ENCOUNTER — Ambulatory Visit: Payer: Self-pay | Attending: Family Medicine | Admitting: Family Medicine

## 2017-06-24 ENCOUNTER — Encounter: Payer: Self-pay | Admitting: Family Medicine

## 2017-06-24 VITALS — BP 114/74 | HR 84 | Temp 98.7°F | Resp 18 | Ht 61.0 in | Wt 146.0 lb

## 2017-06-24 DIAGNOSIS — Z79899 Other long term (current) drug therapy: Secondary | ICD-10-CM | POA: Insufficient documentation

## 2017-06-24 DIAGNOSIS — Z09 Encounter for follow-up examination after completed treatment for conditions other than malignant neoplasm: Secondary | ICD-10-CM

## 2017-06-24 DIAGNOSIS — Z8619 Personal history of other infectious and parasitic diseases: Secondary | ICD-10-CM

## 2017-06-24 DIAGNOSIS — B9681 Helicobacter pylori [H. pylori] as the cause of diseases classified elsewhere: Secondary | ICD-10-CM | POA: Insufficient documentation

## 2017-06-24 DIAGNOSIS — F329 Major depressive disorder, single episode, unspecified: Secondary | ICD-10-CM | POA: Insufficient documentation

## 2017-06-24 DIAGNOSIS — Z8659 Personal history of other mental and behavioral disorders: Secondary | ICD-10-CM

## 2017-06-24 DIAGNOSIS — K219 Gastro-esophageal reflux disease without esophagitis: Secondary | ICD-10-CM | POA: Insufficient documentation

## 2017-06-24 DIAGNOSIS — Z90710 Acquired absence of both cervix and uterus: Secondary | ICD-10-CM | POA: Insufficient documentation

## 2017-06-24 DIAGNOSIS — F419 Anxiety disorder, unspecified: Secondary | ICD-10-CM | POA: Insufficient documentation

## 2017-06-24 DIAGNOSIS — Z8719 Personal history of other diseases of the digestive system: Secondary | ICD-10-CM

## 2017-06-24 NOTE — Telephone Encounter (Signed)
Left message on machine to call back  

## 2017-06-24 NOTE — Patient Instructions (Signed)
Food Choices for Gastroesophageal Reflux Disease, Adult When you have gastroesophageal reflux disease (GERD), the foods you eat and your eating habits are very important. Choosing the right foods can help ease your discomfort. What guidelines do I need to follow?  Choose fruits, vegetables, whole grains, and low-fat dairy products.  Choose low-fat meat, fish, and poultry.  Limit fats such as oils, salad dressings, butter, nuts, and avocado.  Keep a food diary. This helps you identify foods that cause symptoms.  Avoid foods that cause symptoms. These may be different for everyone.  Eat small meals often instead of 3 large meals a day.  Eat your meals slowly, in a place where you are relaxed.  Limit fried foods.  Cook foods using methods other than frying.  Avoid drinking alcohol.  Avoid drinking large amounts of liquids with your meals.  Avoid bending over or lying down until 2-3 hours after eating. What foods are not recommended? These are some foods and drinks that may make your symptoms worse: Vegetables  Tomatoes. Tomato juice. Tomato and spaghetti sauce. Chili peppers. Onion and garlic. Horseradish. Fruits  Oranges, grapefruit, and lemon (fruit and juice). Meats  High-fat meats, fish, and poultry. This includes hot dogs, ribs, ham, sausage, salami, and bacon. Dairy  Whole milk and chocolate milk. Sour cream. Cream. Butter. Ice cream. Cream cheese. Drinks  Coffee and tea. Bubbly (carbonated) drinks or energy drinks. Condiments  Hot sauce. Barbecue sauce. Sweets/Desserts  Chocolate and cocoa. Donuts. Peppermint and spearmint. Fats and Oils  High-fat foods. This includes French fries and potato chips. Other  Vinegar. Strong spices. This includes black pepper, white pepper, red pepper, cayenne, curry powder, cloves, ginger, and chili powder. The items listed above may not be a complete list of foods and drinks to avoid. Contact your dietitian for more information.    This information is not intended to replace advice given to you by your health care provider. Make sure you discuss any questions you have with your health care provider. Document Released: 03/16/2012 Document Revised: 02/21/2016 Document Reviewed: 07/20/2013 Elsevier Interactive Patient Education  2017 Elsevier Inc.  

## 2017-06-24 NOTE — Progress Notes (Signed)
Patient is here for f/up of hysterectomy been 4 weeks they done it   Patient has a burning pain on her lower abdomen

## 2017-06-25 ENCOUNTER — Other Ambulatory Visit: Payer: Self-pay

## 2017-06-25 MED ORDER — SUCRALFATE 1 G PO TABS: 1.0000 g | ORAL_TABLET | Freq: Three times a day (TID) | ORAL | 3 refills | Status: DC

## 2017-06-25 MED FILL — SUCRALFATE 1 GM TABLET: 1 | 30 days supply | Qty: 120 | Fill #0

## 2017-06-25 NOTE — Telephone Encounter (Signed)
Please continue Omeprazole 40mg  BID and can you send in additionally prescription for Carafte QID 30 min before means- and at bedtime. Thanks-JLL

## 2017-06-25 NOTE — Telephone Encounter (Signed)
Patient is advised. Rx for Carafate to the pharmacy at [place]

## 2017-06-25 NOTE — Telephone Encounter (Signed)
Patient states she "is the same". Asked to be specific in her present symptoms. States Left sided pain , burping and intestinal gas. Feel sh is not hungry but the sensation "the way you feel when hungry and the noise your stomach makes when you are hungry."  Confirmed she is on Omeprazole 40 mg. Denies any NSAIDs, diarrhea or bloody stools. Her return office appointment is in November. Please advise.

## 2017-06-25 NOTE — Telephone Encounter (Signed)
Patient returning call to nurse  

## 2017-06-28 NOTE — Progress Notes (Signed)
Subjective:  Patient ID: Aimee Figueroa, female    DOB: 01-13-1971  Age: 46 y.o. MRN: [id number]  CC: Follow-up   HPI [name] presents for follow up. She reports recent history of hysterectomy. She denies any fevers, dysuria, or abdominal bloating. She does report post-op tenderness. History of anxious mood.  She has the following symptoms: difficulty concentrating, feelings of losing control, racing thoughts. Onset of symptoms was approximately several years ago, unchanged since that time. She denies current suicidal and homicidal ideation. Possible organic causes contributing are: none. Risk factors: previous episode of depression Previous treatment includes nortriptyline which she reports taking 2 years ago, with adequate relief of symptoms. She is agreeable to speaking with LCSW today. History of GERD with + h pylori screen. SHe reports completing course of antibitoics course.   Outpatient Medications Prior to Visit  Medication Sig Dispense Refill  . atorvastatin (LIPITOR) 20 MG tablet Take 1 tablet (20 mg total) by mouth daily. (Patient not taking: Reported on 06/24/2017) 30 tablet 2  . calcium citrate (CALCITRATE - DOSED IN MG ELEMENTAL CALCIUM) 950 MG tablet Take 200 mg of elemental calcium by mouth 2 (two) times daily.    . Cholecalciferol (VITAMIN D3) 2000 units TABS Take 2,000 Units by mouth daily.    . Multiple Vitamin (MULTIVITAMIN WITH MINERALS) TABS tablet Take 1 tablet by mouth daily.    Marland Kitchen omeprazole (PRILOSEC) 40 MG capsule Take 1 capsule (40 mg total) by mouth 2 (two) times daily. 30-60 mins before breakfast and dinner (Patient not taking: Reported on 06/24/2017) 60 capsule 2   No facility-administered medications prior to visit.     ROS Review of Systems  Constitutional: Negative.   Respiratory: Negative.   Cardiovascular: Negative.   Gastrointestinal: Positive for abdominal pain (tenderness).  Genitourinary: Negative.   Neurological: Negative.   Psychiatric/Behavioral:  Negative for suicidal ideas. The patient is nervous/anxious.    Objective:  BP 114/74 (BP Location: Left Arm, Patient Position: Sitting, Cuff Size: Normal)   Pulse 84   Temp 98.7 F (37.1 C) (Oral)   Resp 18   Ht 5\' 1"  (1.549 m)   Wt 146 lb (66.2 kg)   LMP 05/01/2017   SpO2 99%   BMI 27.59 kg/m   BP/Weight 06/24/2017 06/16/2017 09/29/7508  Systolic BP 258 527 782  Diastolic BP 74 68 86  Wt. (Lbs) 146 146.5 -  BMI 27.59 27.68 -    Physical Exam  Constitutional: She appears well-developed and well-nourished.  HENT:  Head: Normocephalic and atraumatic.  Right Ear: External ear normal.  Left Ear: External ear normal.  Nose: Nose normal.  Mouth/Throat: Oropharynx is clear and moist.  Neck: Normal range of motion. Neck supple.  Cardiovascular: Normal rate, regular rhythm, normal heart sounds and intact distal pulses.   Pulmonary/Chest: Effort normal and breath sounds normal.  Abdominal: Soft. Bowel sounds are normal. There is tenderness (pelvic (recent history of surgery)). There is no guarding.  Lymphadenopathy:    She has no cervical adenopathy.  Skin: Skin is warm and dry.  Psychiatric: Her mood appears anxious. She expresses no homicidal and no suicidal ideation. She expresses no suicidal plans and no homicidal plans.  Nursing note and vitals reviewed.  Assessment & Plan:  1. Follow up   2. History of Helicobacter pylori infection She reports completing course of antibiotics 2 weeks ago. Future order placed for repeat h pylori screen to follow up with eradication of infection. Follow up lab visit  in 4 weeks. - H.  pylori breath test; Future  3. History of depression Reports follow up with LCSW. She declines medications at this time.  4. History of anxiety Reports follow up with LCSW. She declines medications at this time.  5. History of gastroesophageal reflux (GERD) Continue use of PPI.   No orders of the defined types were placed in this  encounter.   Follow-up: Return in about 3 months (around 09/23/2017), or if symptoms worsen or fail to improve, for HLD/ Depression.   Alfonse Spruce FNP

## 2017-07-07 ENCOUNTER — Telehealth: Payer: Self-pay | Admitting: Physician Assistant

## 2017-07-07 ENCOUNTER — Ambulatory Visit (INDEPENDENT_AMBULATORY_CARE_PROVIDER_SITE_OTHER): Payer: Self-pay | Admitting: Obstetrics & Gynecology

## 2017-07-07 ENCOUNTER — Encounter: Payer: Self-pay | Admitting: Obstetrics & Gynecology

## 2017-07-07 VITALS — BP 130/86

## 2017-07-07 DIAGNOSIS — Z09 Encounter for follow-up examination after completed treatment for conditions other than malignant neoplasm: Secondary | ICD-10-CM

## 2017-07-07 NOTE — Progress Notes (Signed)
    Aimee Figueroa [birth year] [id number]        46 y.o.  [id number] Married  RP:  Postop visit [facility] 05/26/2017  HPI:  5+ wks postop.  Doing much better, has more energy.  No pelvic pain.  No vaginal bleeding or d/c.  No fever.  Mictions/BMs wnl.  Will f/u with GI for Lt upper quadrant pain and with Ortho for lower back pain.    Past medical history,surgical history, problem list, medications, allergies, family history and social history were all reviewed and documented in the EPIC chart.  Family Hx discussed with patient.  No Fam Hx of Colon, Breast, Ovarian Ca.  Therefore, Genetic counseling not indicated, canceled.  Patient relieved.  Directed ROS with pertinent positives and negatives documented in the history of present illness/assessment and plan.  Exam:  Vitals:   07/07/17 0932  BP: 130/86   General appearance:  Normal  Abdo:  Incisions well-healed.  Soft, NT, no distention  Gyn exam:  Vulva normal.  Speculum exam:  Good healing of vaginal vault.  1 small granuloma burt with Silver Nitrate.  Bimanual exam:  No pelvic mass.  Mildly tender.  Assessment/Plan:  46 y.o. [id number]   1. Follow-up examination after gynecological surgery Excellent post op healing.  Can resume all physical activity.  Given mild tenderness on exam of vaginal vault, recommend to wait another 2 weeks before resuming sexual activity with intercourse.  F/U Annual/Gyn exam in 1 year.   Aimee Bruins MD, 9:44 AM 07/07/2017

## 2017-07-07 NOTE — Patient Instructions (Signed)
1. Follow-up examination after gynecological surgery Excellent post op healing.  Can resume all physical activity.  Given mild tenderness on exam of vaginal vault, recommend to wait another 2 weeks before resuming sexual activity with intercourse.  F/U Annual/Gyn exam in 1 year.  Good to see you today Aimee Figueroa!  I will see you for your Annual Gynecologic exam next year.

## 2017-07-08 NOTE — Telephone Encounter (Signed)
Lower left side abdominal pain, EGD may be less helpful. Please advise patient to try IB Gard 1-2 capsules TID with meals. Small frequent meals. Avoid excessive fiber. Schedule follow up office visit in 1-2 weeks. Thanks

## 2017-07-08 NOTE — Telephone Encounter (Signed)
The pt continues to have lower left side abd pain, bloating, gas, gurgling in the stomach.  Worse at times.  She also is complaining of "thin stools"  She is taking Omeprazole BID, Carafate QID with no relief.  Has avoided NSAIDS. Following antireflux precautions.. Please advise in regards to setting up EGD with Dr Silverio Decamp as mentioned in your office note.   Plan: 1. Discussed with the patient today that I believe she has a gastritis/reflux, certainly exacerbated by recent NSAID use. 2. We discussed possible EGD, though I would like to wait on another anesthetic procedure as patient has had 2 in the recent months. 3. Prescribed Omeprazole 40 mg twice a day 30 days #60 with one refill 4. We will call patient in a week, if she has had no improvement of her symptoms we will schedule her for an EGD with Dr. Silverio Decamp. 5. Recommend the patient discontinue her use of NSAIDs or certainly limit them 6. Reviewed antireflux diet and lifestyle modifications 7. Patient to return to clinic in 2-3 weeks with Dr. Silverio Decamp, if not already scheduled for EGD

## 2017-07-08 NOTE — Telephone Encounter (Signed)
Left message on machine to call back  

## 2017-07-08 NOTE — Telephone Encounter (Signed)
Yes, needs EGD in [place] with Dr. Silverio Decamp arranged. Thank you-JLL

## 2017-07-08 NOTE — Telephone Encounter (Signed)
The pt has been advised to try IBgard 1-2 TID and decrease fiber.  She will have smaller more frequent meals and has been scheduled for f/u 07/27/17 with Dr Silverio Decamp.

## 2017-07-10 ENCOUNTER — Ambulatory Visit: Payer: Self-pay | Admitting: Family Medicine

## 2017-07-13 ENCOUNTER — Ambulatory Visit: Payer: Self-pay | Admitting: Family Medicine

## 2017-07-15 ENCOUNTER — Telehealth: Payer: Self-pay | Admitting: Gastroenterology

## 2017-07-15 NOTE — Telephone Encounter (Signed)
Patient is very concerned about her bowel movements. She says they are thin but soft. Her stomach hurts sometimes. Denies any bloody stools, nausea or vomiting or fever. Reviewed at her request the biopsy results from her colonoscopy. The word "precancerous"  Makes her "scared." Patient has an appointment to discuss her ongoing abdominal discomfort.

## 2017-07-16 ENCOUNTER — Encounter: Payer: Self-pay | Admitting: Genetics

## 2017-07-16 ENCOUNTER — Other Ambulatory Visit: Payer: Self-pay

## 2017-07-24 ENCOUNTER — Ambulatory Visit (INDEPENDENT_AMBULATORY_CARE_PROVIDER_SITE_OTHER): Payer: Self-pay | Admitting: Orthopaedic Surgery

## 2017-07-27 ENCOUNTER — Ambulatory Visit (INDEPENDENT_AMBULATORY_CARE_PROVIDER_SITE_OTHER): Payer: Self-pay | Admitting: Gastroenterology

## 2017-07-27 ENCOUNTER — Encounter: Payer: Self-pay | Admitting: Gastroenterology

## 2017-07-27 VITALS — BP 110/68 | HR 76 | Ht 61.0 in | Wt 148.8 lb

## 2017-07-27 DIAGNOSIS — R1032 Left lower quadrant pain: Secondary | ICD-10-CM

## 2017-07-27 DIAGNOSIS — K5900 Constipation, unspecified: Secondary | ICD-10-CM

## 2017-07-27 DIAGNOSIS — K602 Anal fissure, unspecified: Secondary | ICD-10-CM

## 2017-07-27 DIAGNOSIS — R140 Abdominal distension (gaseous): Secondary | ICD-10-CM

## 2017-07-27 DIAGNOSIS — F411 Generalized anxiety disorder: Secondary | ICD-10-CM

## 2017-07-27 MED ORDER — AMBULATORY NON FORMULARY MEDICATION: 2 refills | Status: DC

## 2017-07-27 NOTE — Progress Notes (Signed)
Aimee Figueroa    [id number]    [birth year]  Primary Care Physician:Hairston, Maylon Peppers, FNP  Referring Physician: Alfonse Spruce, [place], [place] [address]  Chief complaint:  LLQ abdominal pain  HPI: 46 year old female with history of fibromyalgia, uterine fibroids, status post hysterectomy here for follow-up visit with complaints of persistent left sided abdominal discomfort. Patient was last seen by Ellouise Newer 06/16/2017. She had CT abdomen and pelvis unremarkable for any GI pathology. MRI spine showed L5-S1 disc disease. Colonoscopy August 2018 with removal of small precancerous polyp in sigmoid colon, was a tubular adenoma. Patient is extremely concerned and worried about the precancerous polyp and her persistent left-sided discomfort associated with excessive bloating, flatus and burping. She has daily bowel movements but has to strain and passes on most days small pellets or thin hard stools. She has occasional anal discomfort after bowel movement. Denies any nausea, vomiting, abdominal pain, melena or bright red blood per rectum Patient was tearful during this visit and expressed concern regarding her health and that she wants to be around to take care of her 5 children at home    Outpatient Encounter Prescriptions as of 07/27/2017  Medication Sig  . atorvastatin (LIPITOR) 20 MG tablet Take 1 tablet (20 mg total) by mouth daily.  . Cholecalciferol (VITAMIN D3) 2000 units TABS Take 2,000 Units by mouth daily.  . Multiple Vitamin (MULTIVITAMIN WITH MINERALS) TABS tablet Take 1 tablet by mouth daily.  Marland Kitchen omega-3 acid ethyl esters (LOVAZA) 1 g capsule Take by mouth 2 (two) times daily.  Marland Kitchen omeprazole (PRILOSEC) 40 MG capsule Take 1 capsule (40 mg total) by mouth 2 (two) times daily. 30-60 mins before breakfast and dinner  . sucralfate (CARAFATE) 1 g tablet Take 1 tablet (1 g total) by mouth 4 (four) times daily -  with meals and at bedtime.  .  [DISCONTINUED] calcium citrate (CALCITRATE - DOSED IN MG ELEMENTAL CALCIUM) 950 MG tablet Take 200 mg of elemental calcium by mouth 2 (two) times daily.   No facility-administered encounter medications on file as of 07/27/2017.     Allergies as of 07/27/2017 - Review Complete 07/27/2017  Allergen Reaction Noted  . Tylenol [acetaminophen] Rash 09/17/2011    Past Medical History:  Diagnosis Date  . Abnormal Pap smear   . Depression   . Fibromyalgia   . Hyperlipemia   . Low blood pressure   . Pyloric ulcer associated with Helicobacter pylori, acute 05/22/2017   patient not sure if she has an ulcer - first dx  . SVD (spontaneous vaginal delivery)    x 4  . 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 CESAREAN SECTION    . COLONOSCOPY    . GYNECOLOGIC CRYOSURGERY    . LAPAROSCOPIC ASSISTED VAGINAL HYSTERECTOMY N/A 05/26/2017   Procedure: LAPAROSCOPIC ASSISTED VAGINAL HYSTERECTOMY;  Surgeon: [name], MD;  Location: [facility];  Service: Gynecology;  Laterality: N/A;  . LAPAROSCOPIC BILATERAL SALPINGECTOMY Bilateral 05/26/2017   Procedure: LAPAROSCOPIC BILATERAL SALPINGECTOMY;  Surgeon: [name], MD;  Location: [facility];  Service: Gynecology;  Laterality: Bilateral;    Family History  Problem Relation Age of Onset  . Diabetes Father   . Hyperlipidemia Father   . Hyperlipidemia Mother   . Cancer Paternal Aunt        uterine  . Ulcerative colitis Cousin   . Anesthesia problems  Neg Hx   . Colon cancer Neg Hx   . Rectal cancer Neg Hx   . Esophageal cancer Neg Hx   . Liver cancer Neg Hx     Social History   Social History  . Marital status: Married    Spouse name: N/A  . Number of children: 5  . Years of education: N/A   Occupational History  . homemaker    Social History Main Topics  . Smoking status: Never Smoker  . Smokeless tobacco: Never Used  . Alcohol use No  . Drug use: No  . Sexual activity: Yes     Partners: Male    Birth control/ protection: Injection     Comment: depo provera 05/13/17   Other Topics Concern  . Not on file   Social History Narrative  . No narrative on file      Review of systems: Review of Systems  Constitutional: Negative for fever and chills.  HENT: Negative.   Eyes: Negative for blurred vision.  Respiratory: Negative for cough, shortness of breath and wheezing.   Cardiovascular: Negative for chest pain and palpitations.  Gastrointestinal: as per HPI Genitourinary: Negative for dysuria, urgency, frequency and hematuria.  Musculoskeletal: Negative for myalgias, back pain and joint pain.  Skin: Negative for itching and rash.  Neurological: Negative for dizziness, tremors, focal weakness, seizures and loss of consciousness.  Endo/Heme/Allergies: Positive for seasonal allergies.  Psychiatric/Behavioral: Negative for depression, suicidal ideas and hallucinations.  All other systems reviewed and are negative.   Physical Exam: Vitals:   07/27/17 0910  BP: 110/68  Pulse: 76   Body mass index is 28.12 kg/m. Gen:      No acute distress HEENT:  EOMI, sclera anicteric Neck:     No masses; no thyromegaly Lungs:    Clear to auscultation bilaterally; normal respiratory effort CV:         Regular rate and rhythm; no murmurs Abd:      + bowel sounds; soft, non-tender; no palpable masses, no distension Ext:    No edema; adequate peripheral perfusion Skin:      Warm and dry; no rash Neuro: alert and oriented x 3 Psych: normal mood and affect Rectal exam: Normal anal sphincter tone, + anal fissure anteriorly , no external hemorrhoids   Data Reviewed:  Reviewed labs, radiology imaging, old records and pertinent past GI work up   Assessment and Plan/Recommendations:  46 year old female with history of fibromyalgia, depression, anxiety here with complaints of left lower quadrant discomfort associated with bloating and constipation Advised patient to  increase fluid intake to 8-10 cups water daily Start Benefiber 1 tablespoon 3 times daily with meals Probiotic align for 2-4 weeks Reassured patient and informed her that CT abdomen and pelvis and colonoscopy were negative for any significant pathology  Small anterior anal fissure: Rectal nitroglycerin 0.125% 2-3 times daily small pea-sized amount for 2 months  Patient wanted to undergo EGD given her symptoms are mostly lower GI with discomfort in the left lower quadrant, EGD will likely be low yield, advised patient to call us if she develops any upper GI symptoms  Return in 3 months or sooner if needed   25 minutes was spent face-to-face with the patient. Greater than 50% of the time used for counseling as well as treatment plan and follow-up. She had multiple questions which were answered to her satisfaction  K. Denzil Magnuson , MD [phone] Mon-Fri 8a-5p [phone] after 5p, weekends, holidays  CC: Alfonse Spruce, F*

## 2017-07-27 NOTE — Patient Instructions (Addendum)
Use Align daily for 2-4 weeks  We have given you a printed prescription of Nitroglycerin ointment to take to [facility] 1 tablespoon twice a day with meals   Follow up in 3 months

## 2017-07-29 ENCOUNTER — Other Ambulatory Visit: Payer: Self-pay | Admitting: Family Medicine

## 2017-07-29 DIAGNOSIS — Z1231 Encounter for screening mammogram for malignant neoplasm of breast: Secondary | ICD-10-CM

## 2017-07-31 MED FILL — ATORVASTATIN 20 MG TABLET: 20 | 30 days supply | Qty: 30 | Fill #1

## 2017-08-05 ENCOUNTER — Ambulatory Visit (INDEPENDENT_AMBULATORY_CARE_PROVIDER_SITE_OTHER): Payer: Self-pay | Admitting: Orthopaedic Surgery

## 2017-08-11 ENCOUNTER — Ambulatory Visit: Payer: Self-pay | Admitting: Gastroenterology

## 2017-08-26 ENCOUNTER — Ambulatory Visit: Payer: Self-pay | Admitting: Family Medicine

## 2017-08-26 ENCOUNTER — Encounter: Payer: Self-pay | Admitting: Obstetrics & Gynecology

## 2017-08-26 ENCOUNTER — Ambulatory Visit (INDEPENDENT_AMBULATORY_CARE_PROVIDER_SITE_OTHER): Payer: Self-pay | Admitting: Obstetrics & Gynecology

## 2017-08-26 ENCOUNTER — Ambulatory Visit (INDEPENDENT_AMBULATORY_CARE_PROVIDER_SITE_OTHER): Payer: Self-pay | Admitting: Orthopaedic Surgery

## 2017-08-26 VITALS — BP 120/84

## 2017-08-26 DIAGNOSIS — N949 Unspecified condition associated with female genital organs and menstrual cycle: Secondary | ICD-10-CM

## 2017-08-26 DIAGNOSIS — F419 Anxiety disorder, unspecified: Secondary | ICD-10-CM

## 2017-08-26 DIAGNOSIS — R102 Pelvic and perineal pain: Secondary | ICD-10-CM

## 2017-08-26 DIAGNOSIS — R350 Frequency of micturition: Secondary | ICD-10-CM

## 2017-08-26 DIAGNOSIS — N898 Other specified noninflammatory disorders of vagina: Secondary | ICD-10-CM

## 2017-08-26 LAB — WET PREP FOR TRICH, YEAST, CLUE

## 2017-08-26 NOTE — Progress Notes (Signed)
    Aimee Figueroa Nov 24, 1970 [id number]        46 y.o.  [id number] Married  RP:  Continued severe vaginal burning  HPI:  Vaginal burning and some rectal itching.  Treated once for Yeast vaginitis, but Sxs still present.  Urinary frequency.  Pressure sensation after prolonged walking.  Feels bloated, followed by GI.  Not sexually x surgery LAVH/Bilateral Salpingectomy on 05/26/2017.  No fever.  Patient and husband feel that anxiety is part of the issue.  Past medical history,surgical history, problem list, medications, allergies, family history and social history were all reviewed and documented in the EPIC chart.  Directed ROS with pertinent positives and negatives documented in the history of present illness/assessment and plan.  Exam:  Vitals:   08/26/17 1112  BP: 120/84   General appearance:  Normal  Gyn exam:  Vulva normal.  Speculum:  Increased vaginal d/c.  Wet prep done. Vaginal vault well healed but granuloma present, Silver Nitrate applied on it. Bimanual exam:  No pelvic mass, NT.  VE in standing position with Valsalva:  No Colpocele, no Cystocele.  Mild Rectocele grade 1/3.    Wet prep: Negative  U/A: Negative   Assessment/Plan:  46 y.o. [id number]   1. Vaginal burning Wet prep and U/A neg. patient reassured.  2. Pelvic pressure in female Normal gyn exam post LAVH/Bilateral Salpingectomy 05/26/2017 except for a Vaginal vault granuloma.  Silver Nitrate applied.  Will f/u in 2 weeks to reapply as needed.  3. Urinary frequency U/A neg.  Refer to Urology for evaluation.  Overactive bladder?  5. Anxiety Anxiety about her health.  Eventual psychotherapy evaluation might be useful if workup completely negative.  Counseling on above issues more than 50% for 25 minutes.  [name] MD, 11:21 AM 08/26/2017

## 2017-08-27 LAB — URINALYSIS W MICROSCOPIC + REFLEX CULTURE
BILIRUBIN URINE: NEGATIVE
Bacteria, UA: NONE SEEN /HPF
GLUCOSE, UA: NEGATIVE
Hgb urine dipstick: NEGATIVE
Hyaline Cast: NONE SEEN /LPF
KETONES UR: NEGATIVE
LEUKOCYTE ESTERASE: NEGATIVE
Nitrites, Initial: NEGATIVE
PROTEIN: NEGATIVE
RBC / HPF: NONE SEEN /HPF (ref 0–2)
Specific Gravity, Urine: 1.01 (ref 1.001–1.03)
WBC UA: NONE SEEN /HPF (ref 0–5)
pH: 7 (ref 5.0–8.0)

## 2017-08-27 LAB — NO CULTURE INDICATED

## 2017-08-29 ENCOUNTER — Encounter: Payer: Self-pay | Admitting: Obstetrics & Gynecology

## 2017-08-29 NOTE — Patient Instructions (Signed)
1. Vaginal burning Wet prep and U/A neg. patient reassured.  2. Pelvic pressure in female Normal gyn exam post LAVH/Bilateral Salpingectomy 05/26/2017 except for a Vaginal vault granuloma.  Silver Nitrate applied.  Will f/u in 2 weeks to reapply as needed.  3. Urinary frequency U/A neg.  Refer to Urology for evaluation.  Overactive bladder?  5. Anxiety Anxiety about her health.  Eventual psychotherapy evaluation might be useful if workup completely negative.  Asencion Partridge, fue un placer verla de nuevo!

## 2017-09-02 ENCOUNTER — Ambulatory Visit
Admission: RE | Admit: 2017-09-02 | Discharge: 2017-09-02 | Disposition: A | Discharge status: Home / Self Care | Payer: No Typology Code available for payment source | Source: Ambulatory Visit | Attending: Family Medicine | Admitting: Family Medicine

## 2017-09-02 DIAGNOSIS — Z1231 Encounter for screening mammogram for malignant neoplasm of breast: Secondary | ICD-10-CM

## 2017-09-04 ENCOUNTER — Ambulatory Visit (INDEPENDENT_AMBULATORY_CARE_PROVIDER_SITE_OTHER): Payer: Self-pay | Admitting: Orthopaedic Surgery

## 2017-09-04 ENCOUNTER — Encounter (INDEPENDENT_AMBULATORY_CARE_PROVIDER_SITE_OTHER): Payer: Self-pay | Admitting: Orthopaedic Surgery

## 2017-09-04 ENCOUNTER — Telehealth: Payer: Self-pay | Admitting: Gastroenterology

## 2017-09-04 VITALS — BP 132/68 | HR 85 | Ht 60.0 in | Wt 150.0 lb

## 2017-09-04 DIAGNOSIS — M5136 Other intervertebral disc degeneration, lumbar region: Secondary | ICD-10-CM

## 2017-09-04 DIAGNOSIS — M5137 Other intervertebral disc degeneration, lumbosacral region: Secondary | ICD-10-CM

## 2017-09-09 NOTE — Telephone Encounter (Signed)
Left a message to call back. If not improved she may need an appointment with Tye Savoy, NP.

## 2017-09-09 NOTE — Progress Notes (Signed)
Office Visit Note   Patient: Aimee Figueroa           Date of Birth: [birth year]           MRN: [id number] Visit Date: 09/04/2017              Requested by: Aimee Figueroa, [place], [place] [address] PCP: Aimee Spruce, FNP   Assessment & Plan: Visit Diagnoses:  1. Degenerative disc disease at L5-S1 level     Plan: We reviewed the MRI studies she does not have any impression neurologically intact.  Core strengthening exercises and a walking program to help with her symptoms.  Develops radicular symptoms she can return for repeat exam.  I gave her a copy of her MRI report we reviewed specific images radiology.  Follow-Up Instructions: No Follow-up on file.   Orders:  No orders of the defined types were placed in this encounter.  No orders of the defined types were placed in this encounter.     Procedures: No procedures performed   Clinical Data: No additional findings.   Subjective: Chief Complaint  Patient presents with  . Lower Back - Pain    HPI 46 year old female saw Dr. Marlou Sa in July ordered an MRI scan of the lumbar spine due to ongoing problems with back pain.  Constant and now tends to come and go worse with activity no catching or grabbing.  She has some leg pain worse in the left than right without numbness or tingling does not wake her up at night.  Had a vaginal hysterectomy done in August laparoscopically assisted.  MRI scan July 2018 showed some disc degeneration with mild neuroforaminal narrowing at L5-S1 no spinal stenosis.  Review of Systems 14 point review of systems is unchanged from 04/10/2017 office note other than as mentioned in HPI.  Of note is previous treated TB, history of depression ,anxiety, and hyperlipidemia.   Objective: Vital Signs: BP 132/68   Pulse 85   Ht 5' (1.524 m)   Wt 150 lb (68 kg)   LMP 05/01/2017   BMI 29.29 kg/m   Physical Exam  Constitutional: She is oriented to person,  place, and time. She appears well-developed.  HENT:  Head: Normocephalic.  Right Ear: External ear normal.  Left Ear: External ear normal.  Eyes: Pupils are equal, round, and reactive to light.  Neck: No tracheal deviation present. No thyromegaly present.  Cardiovascular: Normal rate.  Pulmonary/Chest: Effort normal.  Abdominal: Soft.  Neurological: She is alert and oriented to person, place, and time.  Skin: Skin is warm and dry.  Psychiatric: She has a normal mood and affect. Her behavior is normal.    Ortho Exam patient is able to heel and toe walk.  Minimal trochanteric bursal tenderness on the left straight leg raising at 90 degrees.  No isolated motor weakness no atrophy distal pulses are 2+ and palpable.  Strengths are normal negative logroll to the hips.  Specialty Comments:  No specialty comments available.  Imaging: No results found.   PMFS History: Patient Active Problem List   Diagnosis Date Noted  . Postoperative state 05/26/2017  . Helicobacter pylori (H. pylori) infection 05/22/2017  . Hyperlipidemia 05/22/2017  . Degenerative disc disease at L5-S1 level 04/30/2017  . Lumbar disc herniation 04/30/2017  . Spinal stenosis of lumbar region 04/30/2017  . Chronic left-sided low back pain with left-sided sciatica 04/14/2017  . Cephalalgia 09/06/2014  . Skin depigmentation 09/06/2014  .  Accessory skin tags 09/06/2014  . High triglycerides 09/06/2014  . Depression 09/06/2014  . Abnormal LFTs 09/06/2014  . AMA (advanced maternal age) multigravida 35+ 10/01/2011  . History of cesarean section 10/01/2011   Past Medical History:  Diagnosis Date  . Abnormal Pap smear   . Depression   . Fibromyalgia   . Hyperlipemia   . Low blood pressure   . Pyloric ulcer associated with Helicobacter pylori, acute 05/22/2017   patient not sure if she has an ulcer - first dx  . SVD (spontaneous vaginal delivery)    x 4  . Tuberculosis    46 yrs old    Family History  Problem  Relation Age of Onset  . Diabetes Father   . Hyperlipidemia Father   . Hyperlipidemia Mother   . Cancer Paternal Aunt        uterine  . Ulcerative colitis Cousin   . Anesthesia problems Neg Hx   . Colon cancer Neg Hx   . Rectal cancer Neg Hx   . Esophageal cancer Neg Hx   . Liver cancer Neg Hx     Past Surgical History:  Procedure Laterality Date  . CESAREAN SECTION    . COLONOSCOPY    . GYNECOLOGIC CRYOSURGERY    . LAPAROSCOPIC ASSISTED VAGINAL HYSTERECTOMY N/A 05/26/2017   Procedure: LAPAROSCOPIC ASSISTED VAGINAL HYSTERECTOMY;  Surgeon: [name], MD;  Location: [facility];  Service: Gynecology;  Laterality: N/A;  . LAPAROSCOPIC BILATERAL SALPINGECTOMY Bilateral 05/26/2017   Procedure: LAPAROSCOPIC BILATERAL SALPINGECTOMY;  Surgeon: [name], MD;  Location: [facility];  Service: Gynecology;  Laterality: Bilateral;   Social History   Occupational History  . Occupation: homemaker  Tobacco Use  . Smoking status: Never Smoker  . Smokeless tobacco: Never Used  Substance and Sexual Activity  . Alcohol use: No  . Drug use: No  . Sexual activity: Yes    Partners: Male    Birth control/protection: Injection    Comment: depo provera 05/13/17

## 2017-09-10 ENCOUNTER — Ambulatory Visit (INDEPENDENT_AMBULATORY_CARE_PROVIDER_SITE_OTHER): Payer: Self-pay | Admitting: Obstetrics & Gynecology

## 2017-09-10 ENCOUNTER — Encounter: Payer: Self-pay | Admitting: Obstetrics & Gynecology

## 2017-09-10 VITALS — BP 112/70

## 2017-09-10 DIAGNOSIS — M545 Low back pain, unspecified: Secondary | ICD-10-CM

## 2017-09-10 DIAGNOSIS — T8189XS Other complications of procedures, not elsewhere classified, sequela: Secondary | ICD-10-CM

## 2017-09-10 DIAGNOSIS — G8929 Other chronic pain: Secondary | ICD-10-CM

## 2017-09-10 NOTE — Progress Notes (Signed)
    Aimee Figueroa Aimee Figueroa May 07, 1971 [id number]        46 y.o.  [id number]   RP:   F/U granulomas at vaginal vault  HPI:  Did well post Silver Nitrate application last visit on 08/26/2017.  No current vaginal bleeding.  Continued lower back, hip, inner thighs pain.  Investigation negative with GI, Uro and Ortho.  Past medical history,surgical history, problem list, medications, allergies, family history and social history were all reviewed and documented in the EPIC chart.  Directed ROS with pertinent positives and negatives documented in the history of present illness/assessment and plan.  Exam:  Vitals:   09/10/17 1245  BP: 112/70   General appearance:  Normal  Gyn exam:  Vulva normal.  Speculum:  Vagina normal, except granulomas at vaginal vault.  Silver Nitrate reapplied on granulomas.  The 2 granulomas fell off.  No bleeding.     Assessment/Plan:  46 y.o. [id number]   1. Granuloma of surgical wound, sequela Removed with 2nd application of Silver Nitrate today.  Reassured.  F/U next year for Annual/Gyn exam.  2. Chronic bilateral low back pain without sciatica Decision to refer to [facility] Therapy at [facility].  Patient agrees with plan.  [name] MD, 2:58 PM 09/10/2017

## 2017-09-10 NOTE — Patient Instructions (Signed)
1. Granuloma of surgical wound, sequela Removed with 2nd application of Silver Nitrate today.  Reassured.  F/U next year for Annual/Gyn exam.  2. Chronic bilateral low back pain without sciatica Decision to refer to Physical Therapy at [facility].  Patient agrees with plan.  Asencion Partridge, good seeing you today!  You will receive a phone call to organize a Physical Therapy referral.

## 2017-09-15 ENCOUNTER — Telehealth: Payer: Self-pay | Admitting: *Deleted

## 2017-09-15 NOTE — Telephone Encounter (Signed)
[facility] called [facility] and was informed to fax notes to [phone] and they will call pt and schedule and let me know as well. This was done pt aware they will call her  Also referral urology as well per note on 08/26/17, notes faxed they will contact pt to schedule as well.

## 2017-09-15 NOTE — Telephone Encounter (Signed)
-----   Message from [name], MD sent at 09/10/2017  1:27 PM EST ----- Regarding: Refer to Physical Therapy ([facility]) Chronic lower back/pelvic/Inner leg pains.  LAVH/Bilateral Hysterectomy done 3 months ago for that pain, which is not better.  Investigation negative by GI, Ortho, Uro.  PT of Pelvis/Lower back/Hips.

## 2017-09-28 NOTE — Telephone Encounter (Signed)
I spoke with patient regarding urology referral and told her they have left message for her to call. Pt said she will call them back # given to call.

## 2017-10-23 MED FILL — ?ATORVASTATIN 20MG TABLET: 20 | 30 days supply | Qty: 30 | Fill #2

## 2017-10-28 ENCOUNTER — Ambulatory Visit: Payer: Self-pay | Attending: Family Medicine | Admitting: Physician Assistant

## 2017-10-28 VITALS — BP 117/76 | HR 72 | Temp 98.6°F | Resp 16 | Wt 154.4 lb

## 2017-10-28 DIAGNOSIS — Z886 Allergy status to analgesic agent status: Secondary | ICD-10-CM | POA: Insufficient documentation

## 2017-10-28 DIAGNOSIS — F329 Major depressive disorder, single episode, unspecified: Secondary | ICD-10-CM | POA: Insufficient documentation

## 2017-10-28 DIAGNOSIS — Q383 Other congenital malformations of tongue: Secondary | ICD-10-CM

## 2017-10-28 DIAGNOSIS — E785 Hyperlipidemia, unspecified: Secondary | ICD-10-CM | POA: Insufficient documentation

## 2017-10-28 DIAGNOSIS — D649 Anemia, unspecified: Secondary | ICD-10-CM | POA: Insufficient documentation

## 2017-10-28 DIAGNOSIS — M797 Fibromyalgia: Secondary | ICD-10-CM | POA: Insufficient documentation

## 2017-10-28 DIAGNOSIS — E559 Vitamin D deficiency, unspecified: Secondary | ICD-10-CM | POA: Insufficient documentation

## 2017-10-28 DIAGNOSIS — Z79899 Other long term (current) drug therapy: Secondary | ICD-10-CM | POA: Insufficient documentation

## 2017-10-28 NOTE — Progress Notes (Signed)
Patient ID: Aimee Figueroa, female   DOB: Apr 26, 1971, 47 y.o.   MRN: [id number]     [name], is a 47 y.o. female  EXH:[id number]  FYB:[id number]  DOB - 1970/11/26  Subjective:  Chief Complaint and HPI: Aimee Figueroa is a 47 y.o. female here today Mouth sores and redness on her tongue for about 3 weeks.  Uncomfortable but not painful.  No f/c.  No change in appetite or taste.  Has GI appt coming up for other issues.  She did have some cold symptoms a few weeks ago.  Now feels that the tip of her tongue is swollen and red.  No involvement of hands or feet.  No sick contacts.     ROS:   Constitutional:  No f/c, No night sweats, No unexplained weight loss. EENT:  No vision changes, No blurry vision, No hearing changes. No other mouth, throat, or ear problems.  Respiratory: No cough, No SOB Cardiac: No CP, no palpitations GI:  No abd pain, No N/V/D. GU: No Urinary s/sx Musculoskeletal: No joint pain Neuro: No headache, no dizziness, no motor weakness.  Skin: No rash Endocrine:  No polydipsia. No polyuria.  Psych: Denies SI/HI  No problems updated.  ALLERGIES: Allergies  Allergen Reactions  . Tylenol [Acetaminophen] Rash    PAST MEDICAL HISTORY: Past Medical History:  Diagnosis Date  . Abnormal Pap smear   . Depression   . Fibromyalgia   . Hyperlipemia   . Low blood pressure   . Pyloric ulcer associated with Helicobacter pylori, acute 05/22/2017   patient not sure if she has an ulcer - first dx  . SVD (spontaneous vaginal delivery)    x 4  . Tuberculosis    47 yrs old    MEDICATIONS AT HOME: Prior to Admission medications   Medication Sig Start Date End Date Taking? Authorizing Provider  AMBULATORY NON FORMULARY MEDICATION Medication Name: Nitroglycerin ointment 0.125% Use pea sized amount rectally twice a day 07/27/17   Mauri Pole, MD  atorvastatin (LIPITOR) 20 MG tablet Take 1 tablet (20 mg total) by mouth  daily. Patient not taking: Reported on 09/10/2017 05/22/17   Alfonse Spruce, FNP  Cholecalciferol (VITAMIN D3) 2000 units TABS Take 2,000 Units by mouth daily.    [provider]  Multiple Vitamin (MULTIVITAMIN WITH MINERALS) TABS tablet Take 1 tablet by mouth daily.    [provider]  omega-3 acid ethyl esters (LOVAZA) 1 g capsule Take by mouth 2 (two) times daily.    [provider]  omeprazole (PRILOSEC) 40 MG capsule Take 1 capsule (40 mg total) by mouth 2 (two) times daily. 30-60 mins before breakfast and dinner 06/16/17   Levin Erp, PA  sucralfate (CARAFATE) 1 g tablet Take 1 tablet (1 g total) by mouth 4 (four) times daily -  with meals and at bedtime. 06/25/17   Levin Erp, PA     Objective:  EXAM:   Vitals:   10/28/17 1009  BP: 117/76  Pulse: 72  Resp: 16  Temp: 98.6 F (37 C)  TempSrc: Oral  SpO2: 98%  Weight: 154 lb 6.4 oz (70 kg)    General appearance : A&OX3. NAD. Non-toxic-appearing HEENT: Atraumatic and Normocephalic.  PERRLA. EOM intact.  TM clear B. Mouth-MMM, post pharynx WNL w/o erythema, No PND.  No lesions in mouth are visualized.  Tip of tongue appears a little swollen but otherwise WNL.  No submandibular nodes.  Neck: supple, no JVD. No cervical lymphadenopathy. No thyromegaly Chest/Lungs:  Breathing-non-labored, Good air entry bilaterally, breath sounds normal without rales, rhonchi, or wheezing  CVS: S1 S2 regular, no murmurs, gallops, rubs  Extremities: Bilateral Lower Ext shows no edema, both legs are warm to touch with = pulse throughout Neurology:  CN II-XII grossly intact, Non focal.   Psych:  TP linear. J/I WNL. Normal speech. Appropriate eye contact and affect.  Skin:  No Rash  Data Review Lab Results  Component Value Date   HGBA1C 5.8 (H) 05/31/2015   HGBA1C 5.2 02/01/2014     Assessment & Plan   1. Vitamin D deficiency Last checked 11/2016 - Vitamin D, 25-hydroxy  2. Anemia,  unspecified type - CBC with Differential/Platelet - Vitamin B12 - Folate - Iron, TIBC and Ferritin Panel  3. Tongue abnormality Check labs, salt water gargles tid.  Likely was caused by viral syndrome that is now resolving   Patient have been counseled extensively about nutrition and exercise  Return in about 1 month (around 11/26/2017) for [facility] for high cholesterol, depression, anemia.  The patient was given clear instructions to go to ER or return to medical center if symptoms don't improve, worsen or new problems develop. The patient verbalized understanding. The patient was told to call to get lab results if they haven't heard anything in the next week.     Freeman Caldron, PA-C [facility] [place], [place]   10/28/2017, 10:32 AM

## 2017-10-28 NOTE — Progress Notes (Signed)
Pt states she is having some discomfort on the root of her mouth  Pt states her lips are red  Pt states she is still having rectal discomfort and itching  Pt states there is a change in her bowel movements

## 2017-10-29 ENCOUNTER — Telehealth: Payer: Self-pay

## 2017-10-29 DIAGNOSIS — K589 Irritable bowel syndrome without diarrhea: Secondary | ICD-10-CM | POA: Insufficient documentation

## 2017-10-29 DIAGNOSIS — Z860100 Personal history of colon polyps, unspecified: Secondary | ICD-10-CM | POA: Insufficient documentation

## 2017-10-29 LAB — IRON,TIBC AND FERRITIN PANEL
FERRITIN: 80 ng/mL (ref 15–150)
IRON SATURATION: 69 % — AB (ref 15–55)
Iron: 214 ug/dL — ABNORMAL HIGH (ref 27–159)
Total Iron Binding Capacity: 309 ug/dL (ref 250–450)
UIBC: 95 ug/dL — ABNORMAL LOW (ref 131–425)

## 2017-10-29 LAB — CBC WITH DIFFERENTIAL/PLATELET
BASOS ABS: 0 10*3/uL (ref 0.0–0.2)
BASOS: 0 %
EOS (ABSOLUTE): 0.1 10*3/uL (ref 0.0–0.4)
Eos: 1 %
Hematocrit: 43.6 % (ref 34.0–46.6)
Hemoglobin: 15.4 g/dL (ref 11.1–15.9)
Immature Grans (Abs): 0 10*3/uL (ref 0.0–0.1)
Immature Granulocytes: 0 %
LYMPHS ABS: 2.2 10*3/uL (ref 0.7–3.1)
Lymphs: 45 %
MCH: 32.5 pg (ref 26.6–33.0)
MCHC: 35.3 g/dL (ref 31.5–35.7)
MCV: 92 fL (ref 79–97)
MONOS ABS: 0.2 10*3/uL (ref 0.1–0.9)
Monocytes: 5 %
NEUTROS ABS: 2.3 10*3/uL (ref 1.4–7.0)
Neutrophils: 49 %
PLATELETS: 177 10*3/uL (ref 150–379)
RBC: 4.74 x10E6/uL (ref 3.77–5.28)
RDW: 13.9 % (ref 12.3–15.4)
WBC: 4.8 10*3/uL (ref 3.4–10.8)

## 2017-10-29 LAB — FOLATE: Folate: >20 ng/mL (ref >3.0)

## 2017-10-29 LAB — VITAMIN B12: VITAMIN B 12: 1978 pg/mL — AB (ref 232–1245)

## 2017-10-29 LAB — VITAMIN D 25 HYDROXY (VIT D DEFICIENCY, FRACTURES): Vit D, 25-Hydroxy: 33.3 ng/mL (ref 30.0–100.0)

## 2017-10-29 NOTE — Telephone Encounter (Signed)
Contacted pt to go over lab results pt is aware of results and doesn't have any questions or concerns 

## 2017-11-04 ENCOUNTER — Telehealth: Payer: Self-pay | Admitting: Gastroenterology

## 2017-11-04 NOTE — Telephone Encounter (Signed)
Patient states her gi symptoms have gotten worse and she wants advice until appt on 2.13.19 with APP Amy

## 2017-11-04 NOTE — Telephone Encounter (Signed)
Spoke with the patient. She complains of a sensation of something sticking in her throat. Cannot "make it go down." She is afraid to eat because that makes it feel worse. Her stomach hurts but not burning.  She complains of loose, but watery, stools at least twice a day. She will feel like she needs to have a bowel movement and cannot.  She does not feel well and would like to be seen earlier. Appointment moved to tomorrow.

## 2017-11-05 ENCOUNTER — Ambulatory Visit (INDEPENDENT_AMBULATORY_CARE_PROVIDER_SITE_OTHER): Payer: Self-pay | Admitting: Physician Assistant

## 2017-11-05 ENCOUNTER — Encounter: Payer: Self-pay | Admitting: Physician Assistant

## 2017-11-05 VITALS — BP 114/66 | HR 71 | Ht 61.0 in | Wt 153.0 lb

## 2017-11-05 DIAGNOSIS — R1310 Dysphagia, unspecified: Secondary | ICD-10-CM

## 2017-11-05 DIAGNOSIS — K6289 Other specified diseases of anus and rectum: Secondary | ICD-10-CM

## 2017-11-05 DIAGNOSIS — L290 Pruritus ani: Secondary | ICD-10-CM

## 2017-11-05 DIAGNOSIS — K602 Anal fissure, unspecified: Secondary | ICD-10-CM

## 2017-11-05 MED ORDER — AMBULATORY NON FORMULARY MEDICATION: 2 refills | Status: DC

## 2017-11-05 NOTE — Patient Instructions (Addendum)
You have been scheduled for an endoscopy. Please follow written instructions given to you at your visit today. If you use inhalers (even only as needed), please bring them with you on the day of your procedure. Your physician has requested that you go to www.startemmi.com and enter the access code given to you at your visit today. This web site gives a general overview about your procedure. However, you should still follow specific instructions given to you by our office regarding your preparation for the procedure.  FD gard 2 capsule twice a day.  Continue Omeprazole 40 mg twice a day.   We have sent a prescription for nitroglycerin 0.125% gel to [facility]. You should apply a pea size amount to your rectum three times daily x 6-8 weeks.  [facility]'s information is below: Address: [address], [place], [place] [address]  [phone]  *Please DO NOT go directly from our office to pick up this medication! Give the pharmacy 1 day to process the prescription as this is compounded and takes time to make.  Continue Sitz baths

## 2017-11-05 NOTE — Progress Notes (Signed)
Chief Complaint: Change in bowel habits, lower abdominal pain, rectal pain, dysphagia  HPI:    Aimee Figueroa is a 47 year old female with a past medical history as listed below including fibromyalgia and uterine fibroids status post hysterectomy who presents to clinic today with a complaint of change in bowel habits, lower abdominal pain, rectal pain and dysphagia.    Please recall we have been following the patient for similar complaints for 6 months now.  Patient was last seen in clinic 07/27/17 by Dr. Silverio Decamp and at that time patient was extremely concerned and worried about finding of a tubular adenoma during her last colonoscopy in August 2018.  She also had persistent left-sided discomfort associated with excessive bloating, flatus and burping.  She had daily bowel movements but had to strain to pass most of these which were small pellets or thin hard stools.  She had occasional anal discomfort after a bowel movement.  Patient had normal anal sphincter tone and a positive anal fissure anteriorly with no external hemorrhoids on rectal exam.  She was started on nitroglycerin 0.125% 2-3 times daily for 2 months.  She was also started on Benefiber and reassured regarding her abdominal pain and colonoscopy.    Today, the patient presents clinic and explains that she used nitroglycerin for at least 2 months as prescribed.  She never felt as though anything got better as far as "back there".  The patient continues to describe a "itchy/burning rectum".  She also continues with some rectal fullness and tells me that her stools are still abnormal describing a thin/pasty stool.  Often stools are accompanied with a lower abdominal pain.  The patient is very worried about all of these symptoms.    Patient also describes some trouble swallowing which started over the past week.  Patient tells me she feels as though food gets caught on its way down.  Along with this the patient experienced some blisters on  her tongue for which she saw her PCP.  She had labs done including vitamin D and others which were all normal.    Patient also describes some what sounds like paresthesias in her feet and hands.  She tells me she is going to see a doctor about this next week.    Patient is very tearful during time of exam today again.  She tells me she is just "extremely anxious".    Patient denies fever, chills, bright red blood in her stool, melena, weight loss, heartburn, reflux, nausea or vomiting.  Past Medical History:  Diagnosis Date  . Abnormal Pap smear   . Depression   . Fibromyalgia   . Hyperlipemia   . Low blood pressure   . Pyloric ulcer associated with Helicobacter pylori, acute 05/22/2017   patient not sure if she has an ulcer - first dx  . SVD (spontaneous vaginal delivery)    x 4  . Tuberculosis    47 yrs old    Past Surgical History:  Procedure Laterality Date  . CESAREAN SECTION    . COLONOSCOPY    . GYNECOLOGIC CRYOSURGERY    . LAPAROSCOPIC ASSISTED VAGINAL HYSTERECTOMY N/A 05/26/2017   Procedure: LAPAROSCOPIC ASSISTED VAGINAL HYSTERECTOMY;  Surgeon: [name], MD;  Location: [facility];  Service: Gynecology;  Laterality: N/A;  . LAPAROSCOPIC BILATERAL SALPINGECTOMY Bilateral 05/26/2017   Procedure: LAPAROSCOPIC BILATERAL SALPINGECTOMY;  Surgeon: [name], MD;  Location: [facility];  Service: Gynecology;  Laterality: Bilateral;    Current Outpatient Medications  Medication Sig Dispense Refill  .  AMBULATORY NON FORMULARY MEDICATION Medication Name: Nitroglycerin ointment 0.125% Use pea sized amount rectally twice a day 30 g 2  . atorvastatin (LIPITOR) 20 MG tablet Take 1 tablet (20 mg total) by mouth daily. 30 tablet 2  . Cholecalciferol (VITAMIN D3) 2000 units TABS Take 2,000 Units by mouth daily.    . Multiple Vitamin (MULTIVITAMIN WITH MINERALS) TABS tablet Take 1 tablet by mouth daily.    Marland Kitchen omega-3 acid ethyl esters (LOVAZA) 1 g capsule Take by mouth 2 (two) times  daily.    Marland Kitchen omeprazole (PRILOSEC) 40 MG capsule Take 1 capsule (40 mg total) by mouth 2 (two) times daily. 30-60 mins before breakfast and dinner 60 capsule 2  . sucralfate (CARAFATE) 1 g tablet Take 1 tablet (1 g total) by mouth 4 (four) times daily -  with meals and at bedtime. 120 tablet 3   No current facility-administered medications for this visit.     Allergies as of 11/05/2017 - Review Complete 11/05/2017  Allergen Reaction Noted  . Tylenol [acetaminophen] Rash 09/17/2011    Family History  Problem Relation Age of Onset  . Diabetes Father   . Hyperlipidemia Father   . Hyperlipidemia Mother   . Uterine cancer Paternal Aunt   . Ulcerative colitis Cousin   . Anesthesia problems Neg Hx   . Colon cancer Neg Hx   . Rectal cancer Neg Hx   . Esophageal cancer Neg Hx   . Liver cancer Neg Hx     Social History   Socioeconomic History  . Marital status: Married    Spouse name: Not on file  . Number of children: 5  . Years of education: Not on file  . Highest education level: Not on file  Social Needs  . Financial resource strain: Not on file  . Food insecurity - worry: Not on file  . Food insecurity - inability: Not on file  . Transportation needs - medical: Not on file  . Transportation needs - non-medical: Not on file  Occupational History  . Occupation: homemaker  Tobacco Use  . Smoking status: Never Smoker  . Smokeless tobacco: Never Used  Substance and Sexual Activity  . Alcohol use: No  . Drug use: No  . Sexual activity: Yes    Partners: Male    Birth control/protection: Injection    Comment: depo provera 05/13/17  Other Topics Concern  . Not on file  Social History Narrative  . Not on file    Review of Systems:    Constitutional: No weight loss, fever or chills Skin: No rash  Cardiovascular: No chest pain Respiratory: No SOB  Gastrointestinal: See HPI and otherwise negative   Physical Exam:  Vital signs: BP 114/66   Pulse 71   Ht 5\' 1"  (1.549 m)    Wt 153 lb (69.4 kg)   LMP 05/01/2017   BMI 28.91 kg/m   Constitutional:   Anxious female appears to be in NAD, Well developed, Well nourished, alert and cooperative Respiratory: Respirations even and unlabored. Lungs clear to auscultation bilaterally.   No wheezes, crackles, or rhonchi.  Cardiovascular: Normal S1, S2. No MRG. Regular rate and rhythm. No peripheral edema, cyanosis or pallor.  Gastrointestinal:  Soft, nondistended, nontender. No rebound or guarding. Normal bowel sounds. No appreciable masses or hepatomegaly. Rectal:  External : Persistent anterior fissure, small skin tag Psychiatric: Oriented to person, place and time. Demonstrates good judgement and reason without abnormal affect or behaviors. Tearful at time of exam  No recent  labs or imaging.  Assessment: 1.  Anal fissure: Persists today 2.  Rectal pain and itching: Likely due to above plus/minus moisture around skin tag 3.  Dysphagia: Over the past week patient has felt as though food and pills are getting stuck in her throat; consider functional symptoms versus stricture  Plan: 1.  Reassured the patient regarding her previous workup.  Went through previous testing again including CT and colonoscopy. 2.  Refilled patient's Nitroglycerin to be applied 3 times daily for 6-8 weeks. 3.  Encouraged continued sitz baths 2-3 times a day for 15-20 minutes at a time. 4.  Recommend the patient start [place] 2 caps twice daily.  Provided her with a coupon. 5.  Scheduled patient for an EGD with possible dilation in the [facility] [facility] with Dr. Silverio Decamp.  Did discuss risk, benefits, limitations and alternatives the patient agrees to proceed. 6.  Patient to follow in clinic per recommendations from Dr. Silverio Decamp after time of procedure.  Ellouise Newer, PA-C [facility] 11/05/2017, 4:09 PM  Cc: Orlando Penner*

## 2017-11-06 ENCOUNTER — Encounter: Payer: Self-pay | Admitting: Nurse Practitioner

## 2017-11-06 ENCOUNTER — Ambulatory Visit: Payer: Self-pay | Attending: Nurse Practitioner | Admitting: Nurse Practitioner

## 2017-11-06 VITALS — BP 107/66 | HR 87 | Temp 98.2°F | Ht 61.0 in | Wt 154.4 lb

## 2017-11-06 DIAGNOSIS — F329 Major depressive disorder, single episode, unspecified: Secondary | ICD-10-CM | POA: Insufficient documentation

## 2017-11-06 DIAGNOSIS — R1310 Dysphagia, unspecified: Secondary | ICD-10-CM | POA: Insufficient documentation

## 2017-11-06 DIAGNOSIS — E785 Hyperlipidemia, unspecified: Secondary | ICD-10-CM | POA: Insufficient documentation

## 2017-11-06 DIAGNOSIS — Z833 Family history of diabetes mellitus: Secondary | ICD-10-CM | POA: Insufficient documentation

## 2017-11-06 DIAGNOSIS — Z79899 Other long term (current) drug therapy: Secondary | ICD-10-CM | POA: Insufficient documentation

## 2017-11-06 DIAGNOSIS — Z9889 Other specified postprocedural states: Secondary | ICD-10-CM | POA: Insufficient documentation

## 2017-11-06 DIAGNOSIS — K1370 Unspecified lesions of oral mucosa: Secondary | ICD-10-CM | POA: Insufficient documentation

## 2017-11-06 NOTE — Progress Notes (Signed)
Reviewed and agree with documentation and assessment and plan. K. Veena Amritpal Shropshire , MD   

## 2017-11-06 NOTE — Progress Notes (Signed)
Assessment & Plan:  Aimee Figueroa was seen today for follow-up.  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 These are reviewed and up-to-date. Referrals have been placed accordingly. Immunizations are up-to-date or declined.    Subjective:   Chief Complaint  Patient presents with  . Follow-up    Patient stated she is here for follow-up on bumps on her mouths and when she eats, it feel like it stucks on her throat.    HPI Aimee Figueroa - Aimee Figueroa 47 y.o. female presents to office today with complaints of dysphagia. She was seen by GI yesterday. They are in the process of scheduling EGD with possible dilation.   Oral lesions She has concerns of an oral lesions on her tongue and the inside of her bottom lip. I am unable to visualize any lesions on the inside of her bottom lip. She was seen in this office with similar complaints on 10-28-2017. At that time she was endorsing mouth sores for 3 weeks and cold symptoms as well during the onset of the lesions. She denied any fevers and denies any fevers today as well. The lesions are not painful. Labs were drawn; Vitamin D was slightly abnormal (20.7); B12 was minimally elevated; 1,978. Folate was normal as well as WBC.   Review of Systems  Constitutional: Negative.  Negative for chills, fever, malaise/fatigue and weight loss.  HENT:       See HPI  Respiratory: Negative.  Negative for cough, sputum production and shortness of breath.   Cardiovascular: Negative.  Negative for chest pain, orthopnea and leg swelling.  Gastrointestinal: Negative for nausea and vomiting.       Dysphagia  Neurological: Negative.  Negative for dizziness, tingling, tremors and sensory change.  Psychiatric/Behavioral: Negative.     Past Medical History:    Diagnosis Date  . Abnormal Pap smear   . Depression   . Fibromyalgia   . Hyperlipemia   . Low blood pressure   . Pyloric ulcer associated with Helicobacter pylori, acute 05/22/2017   patient not sure if she has an ulcer - first dx  . SVD (spontaneous vaginal delivery)    x 4  . Tuberculosis    47 yrs old    Past Surgical History:  Procedure Laterality Date  . CESAREAN SECTION    . COLONOSCOPY    . GYNECOLOGIC CRYOSURGERY    . LAPAROSCOPIC ASSISTED VAGINAL HYSTERECTOMY N/A 05/26/2017   Procedure: LAPAROSCOPIC ASSISTED VAGINAL HYSTERECTOMY;  Surgeon: [name], MD;  Location: [facility];  Service: Gynecology;  Laterality: N/A;  . LAPAROSCOPIC BILATERAL SALPINGECTOMY Bilateral 05/26/2017   Procedure: LAPAROSCOPIC BILATERAL SALPINGECTOMY;  Surgeon: [name], MD;  Location: [facility];  Service: Gynecology;  Laterality: Bilateral;    Family History  Problem Relation Age of Onset  . Diabetes Father   . Hyperlipidemia Father   . Hyperlipidemia Mother   . Uterine cancer Paternal Aunt   . Ulcerative colitis Cousin   . Anesthesia problems Neg Hx   . Colon cancer Neg Hx   . Rectal cancer Neg Hx   . Esophageal cancer Neg Hx   . Liver cancer Neg Hx     Social History Reviewed with no changes to be made today.   Outpatient Medications Prior to Visit  Medication Sig Dispense Refill  . atorvastatin (LIPITOR) 20 MG tablet Take 1 tablet (20 mg total) by mouth daily. 30 tablet 2  . Cholecalciferol (VITAMIN D3) 2000 units TABS Take 2,000 Units by mouth daily.    Marland Kitchen omega-3 acid ethyl esters (LOVAZA) 1 g capsule Take by mouth 2 (two) times daily.    Marland Kitchen omeprazole (PRILOSEC) 40 MG capsule Take 1 capsule (40 mg total) by mouth 2 (two) times daily. 30-60 mins before breakfast and dinner 60 capsule 2  . AMBULATORY NON FORMULARY MEDICATION Medication Name: Nitroglycerin ointment 0.125% Use pea sized amount rectally twice a day (Patient not taking: Reported on 11/06/2017) 30 g 2  .  Multiple Vitamin (MULTIVITAMIN WITH MINERALS) TABS tablet Take 1 tablet by mouth daily.    . sucralfate (CARAFATE) 1 g tablet Take 1 tablet (1 g total) by mouth 4 (four) times daily -  with meals and at bedtime. (Patient not taking: Reported on 11/06/2017) 120 tablet 3   No facility-administered medications prior to visit.     Allergies  Allergen Reactions  . Tylenol [Acetaminophen] Rash       Objective:    BP 107/66 (BP Location: Left Arm, Patient Position: Sitting, Cuff Size: Normal)   Pulse 87   Temp 98.2 F (36.8 C) (Oral)   Ht 5\' 1"  (1.549 m)   Wt 154 lb 6.4 oz (70 kg)   LMP 05/01/2017   SpO2 98%   BMI 29.17 kg/m  Wt Readings from Last 3 Encounters:  11/06/17 154 lb 6.4 oz (70 kg)  11/05/17 153 lb (69.4 kg)  10/28/17 154 lb 6.4 oz (70 kg)    Physical Exam  Constitutional: She is oriented to person, place, and time. She appears well-developed and well-nourished.  HENT:  Head: Normocephalic and atraumatic.  Right Ear: Hearing, tympanic membrane, external ear and ear canal normal.  Left Ear: Hearing, tympanic membrane, external ear and ear canal normal.  Nose: Nose normal.  Mouth/Throat: Uvula is midline, oropharynx is clear and moist and mucous membranes are normal. No oropharyngeal exudate, posterior oropharyngeal edema, posterior oropharyngeal erythema or tonsillar abscesses.    Neck: Normal range of motion. Neck supple. No thyromegaly present.  Cardiovascular: Normal rate, regular rhythm and normal heart sounds.  Pulmonary/Chest: Effort normal and breath sounds normal. No respiratory distress. She has no wheezes. She has no rales.  Abdominal: Soft. Bowel sounds are normal. She exhibits no distension and no mass. There is no tenderness. There is no rebound and no guarding.  Lymphadenopathy:    She has no cervical adenopathy.  Neurological: She is alert and oriented to person, place, and time.  Skin: Skin is warm and dry. No rash noted. No erythema. No pallor.    Psychiatric: She has a normal mood and affect. Her behavior is normal. Judgment and thought content normal.      Patient has been counseled extensively about nutrition and exercise as well as the importance of adherence with medications and regular follow-up. The patient was given clear instructions to go to ER or return to medical center if symptoms don't improve, worsen or new problems develop. The patient verbalized understanding.   Follow-up: Return if symptoms worsen or fail to improve.   Gildardo Pounds, FNP-BC [facility], [place]   11/08/2017, 10:27 PM

## 2017-11-06 NOTE — Patient Instructions (Addendum)
Canker Sores Canker sores are small, painful sores that develop inside your mouth. They may also be called aphthous ulcers. You can get canker sores on the inside of your lips or cheeks, on your tongue, or anywhere inside your mouth. You can have just one canker sore or several of them. Canker sores cannot be passed from one person to another (noncontagious). These sores are different than the sores that you may get on the outside of your lips (cold sores or fever blisters). Canker sores usually start as painful red bumps. Then they turn into small white, yellow, or gray ulcers that have red borders. The ulcers may be quite painful. The pain may be worse when you eat or drink. What are the causes? The cause of this condition is not known. What increases the risk? This condition is more likely to develop in:  Women.  People in their teens or 76s.  Women who are having their menstrual period.  People who are under a lot of emotional stress.  People who do not get enough iron or B vitamins.  People who have poor oral hygiene.  People who have an injury inside the mouth. This can happen after having dental work or from chewing something hard.  What are the signs or symptoms? Along with the canker sore, symptoms may also include:  Fever.  Fatigue.  Swollen lymph nodes in your neck.  How is this diagnosed? This condition can be diagnosed based on your symptoms. Your health care provider will also examine your mouth. Your health care provider may also do tests if you get canker sores often or if they are very bad. Tests may include:  Blood tests to rule out other causes of canker sores.  Taking swabs from the sore to check for infection.  Taking a small piece of skin from the sore (biopsy) to test it for cancer.  How is this treated? Most canker sores clear up without treatment in about 10 days. Home care is usually the only treatment that you will need. Over-the-counter medicines  can relieve discomfort.If you have severe canker sores, your health care provider may prescribe:  Numbing ointment to relieve pain.  Vitamins.  Steroid medicines. These may be given as: ? Oral pills. ? Mouth rinses. ? Gels.  Antibiotic mouth rinse.  Follow these instructions at home:  Apply, take, or use medicines only as directed by your health care provider. These include vitamins.  If you were prescribed an antibiotic mouth rinse, finish all of it even if you start to feel better.  Until the sores are healed: ? Do not drink coffee or citrus juices. ? Do not eat spicy or salty foods.  Use a mild, over-the-counter mouth rinse as directed by your health care provider.  Practice good oral hygiene. ? Floss your teeth every day. ? Brush your teeth with a soft brush twice each day. Contact a health care provider if:  Your symptoms do not get better after two weeks.  You also have a fever or swollen glands.  You get canker sores often.  You have a canker sore that is getting larger.  You cannot eat or drink due to your canker sores. This information is not intended to replace advice given to you by your health care provider. Make sure you discuss any questions you have with your health care provider. Document Released: 01/10/2011 Document Revised: 02/21/2016 Document Reviewed: 08/16/2014 Elsevier Interactive Patient Education  2018 [employer]. Herpes Simplex Test There are two  common types of herpes simplex virus (HSV). These are classified as Type 1 (HSV1) or Type 2 (HSV2). Type 1 often causes cold sores on or around the mouth and sometimes on or around the eyes. Type 2 is commonly known as a sexually transmitted infection that causes sores in and around the genitals. Both types of herpes simplex can cause sores in different areas. There are two types of herpes simplex tests. These include:  Culture. This consists of collecting and testing a sample of fluid with a cotton  swab from an open sore. This can only be done during an active infection (outbreak). Culture tests take several days to complete but are very accurate.  HSV blood tests. This test is not as accurate as a culture. However, HSV blood tests are faster than cultures, often providing a test result within one day. ? HSV antibody test. This checks for the presence of antibodies against HSV in your blood. Antibodies are proteins your body makes to help fight infection. ? HSV antigen test. This checks for the presence of the HSV germ (antigen) in your blood.  Your health care provider may recommend you have a HSV test if:  He or she believes you have a HSV infection.  You have a weakened immune system (immunocompromised) and you have sores around your mouth or genitals that look like HSV eruptions.  You have a fever of unknown origin (FUO).  You are pregnant, have herpes, and are expecting to deliver a baby vaginally in the next 6-8 weeks.  What do the results mean? It is your responsibility to obtain your test results. Ask the lab or department performing the test when and how you will get your results. Contact your health care provider to discuss any questions you have about your results. Range of Normal Values Ranges for normal values may vary among different labs and hospitals. You should always check with your health care provider after having lab work or other tests done to discuss whether your values are considered within normal limits. Normal findings include:  No HSV antigen or antibodies present in your blood.  No HSV antigen present in cultured fluid.  Meaning of Results Outside Normal Ranges The following test results may indicate that you have an active HSV infection:  Positive culture for HSV1 or HSV2.  Presence of HSV1 or HSV2 antigens in your blood.  Presence of certain HSV1 or HSV2 antibodies (IgM) in your blood.  Discuss your test results with your health care provider. He  or she will use the results to make a diagnosis and determine a treatment plan that is right for you. Talk with your health care provider to discuss your results, treatment options, and if necessary, the need for more tests. Talk with your health care provider if you have any questions about your results. This information is not intended to replace advice given to you by your health care provider. Make sure you discuss any questions you have with your health care provider. Document Released: 10/18/2004 Document Revised: 05/21/2016 Document Reviewed: 01/31/2014 Elsevier Interactive Patient Education  2018 [employer].

## 2017-11-08 ENCOUNTER — Encounter: Payer: Self-pay | Admitting: Nurse Practitioner

## 2017-11-09 ENCOUNTER — Telehealth: Payer: Self-pay

## 2017-11-09 NOTE — Telephone Encounter (Signed)
She needs to make an appointment for wet prep

## 2017-11-09 NOTE — Telephone Encounter (Signed)
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

## 2017-11-09 NOTE — Telephone Encounter (Signed)
CMA called patient to inform on lab result.  Patient understood.  Patient stated she's been dealing with vaginal discharge and think its yeast, and itching.    Patient would like to have her vaginal check.

## 2017-11-10 LAB — HSV-2 IGG SUPPLEMENTAL TEST: HSV-2 IgG Supplemental Test: POSITIVE — AB

## 2017-11-10 LAB — HSV TYPE I/II IGG, IGMW/ REFLEX
HSV 1 Glycoprotein G Ab, IgG: 24.4 index — ABNORMAL HIGH (ref 0.00–0.90)
HSV 2 IgG, Type Spec: 3.47 index — ABNORMAL HIGH (ref 0.00–0.90)

## 2017-11-10 NOTE — Telephone Encounter (Signed)
CMA called patient to make a appointment. Patient made an appointment for tomorrow 11/11/17 at 2:00 pm for Wet prep.

## 2017-11-10 NOTE — Telephone Encounter (Signed)
NOTED

## 2017-11-11 ENCOUNTER — Ambulatory Visit: Payer: Self-pay | Attending: Nurse Practitioner | Admitting: Nurse Practitioner

## 2017-11-11 ENCOUNTER — Encounter: Payer: Self-pay | Admitting: Nurse Practitioner

## 2017-11-11 ENCOUNTER — Ambulatory Visit: Payer: No Typology Code available for payment source | Admitting: Physician Assistant

## 2017-11-11 VITALS — BP 105/62 | HR 81 | Temp 98.6°F | Ht 61.0 in | Wt 155.2 lb

## 2017-11-11 DIAGNOSIS — F321 Major depressive disorder, single episode, moderate: Secondary | ICD-10-CM | POA: Insufficient documentation

## 2017-11-11 DIAGNOSIS — B9689 Other specified bacterial agents as the cause of diseases classified elsewhere: Secondary | ICD-10-CM | POA: Insufficient documentation

## 2017-11-11 DIAGNOSIS — N760 Acute vaginitis: Secondary | ICD-10-CM | POA: Insufficient documentation

## 2017-11-11 DIAGNOSIS — M797 Fibromyalgia: Secondary | ICD-10-CM | POA: Insufficient documentation

## 2017-11-11 DIAGNOSIS — Z9889 Other specified postprocedural states: Secondary | ICD-10-CM | POA: Insufficient documentation

## 2017-11-11 DIAGNOSIS — R1032 Left lower quadrant pain: Secondary | ICD-10-CM | POA: Insufficient documentation

## 2017-11-11 DIAGNOSIS — Z79899 Other long term (current) drug therapy: Secondary | ICD-10-CM | POA: Insufficient documentation

## 2017-11-11 DIAGNOSIS — Z83438 Family history of other disorder of lipoprotein metabolism and other lipidemia: Secondary | ICD-10-CM | POA: Insufficient documentation

## 2017-11-11 DIAGNOSIS — Z90722 Acquired absence of ovaries, bilateral: Secondary | ICD-10-CM | POA: Insufficient documentation

## 2017-11-11 DIAGNOSIS — Z8049 Family history of malignant neoplasm of other genital organs: Secondary | ICD-10-CM | POA: Insufficient documentation

## 2017-11-11 DIAGNOSIS — Z886 Allergy status to analgesic agent status: Secondary | ICD-10-CM | POA: Insufficient documentation

## 2017-11-11 DIAGNOSIS — Z90710 Acquired absence of both cervix and uterus: Secondary | ICD-10-CM | POA: Insufficient documentation

## 2017-11-11 DIAGNOSIS — Z833 Family history of diabetes mellitus: Secondary | ICD-10-CM | POA: Insufficient documentation

## 2017-11-11 DIAGNOSIS — E785 Hyperlipidemia, unspecified: Secondary | ICD-10-CM | POA: Insufficient documentation

## 2017-11-11 NOTE — Progress Notes (Signed)
Assessment & Plan:  Aimee Figueroa was seen today for establish care and abdominal pain.  Diagnoses and all orders for this visit:  Acute vaginitis -     Cervicovaginal ancillary only  Abdominal discomfort in left lower quadrant -     Celiac Panel Avoid lactose, caffeine, spicy or acidic foods. Increase your fiber and add more fruits and vegetables to your meals.   Moderate major depression (HCC) Stable. Continue to monitor.     Patient has been counseled on age-appropriate routine health concerns for screening and prevention. These are reviewed and up-to-date. Referrals have been placed accordingly. Immunizations are up-to-date or declined.    Subjective:   Chief Complaint  Patient presents with  . Establish Care    Patient is here to establish care. Patient would like to have her vaginal check.   . Abdominal Pain    Patient stated she have pain on her abdomen,  back pain, rectal pain.    HPI Aimee Figueroa 47 y.o. female presents to office today with complaints of vaginitis and GI issues which she is currently seeing GI for work up. She has a history of depression, hysterectomy due to fibroids, fibromyalgia, hyperlipidemia and hpylori. 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 We discussed those results in great detail today. She has asked me to speak with her husband if he has concerns regarding the diagnosis. All of her questions were answered to the best of my ability.   Vaginitis Patient complains of an abnormal vaginal discharge for several days. Vaginal symptoms include discharge described as white, malodorous and thick and vulvar itching.Vulvar symptoms include local irritation.STI Risk: Very low risk of STD exposure. Discharge described as: thick.Other associated symptoms: none.Menstrual pattern:post hysterectomy. Contraception: none  Abdominal Pain:  Patient complains of abdominal pain. The pain is described as pressure-like, and is 6/10 in intensity.  Pain is located in the LUQ, LLQ without radiation. Onset was several months ago. Symptoms have been unchanged since. Aggravating factors: bowel movement.  Alleviating factors: none. Associated symptoms: constipation and flatus. The patient denies anorexia, fever, hematochezia, hematuria and melena. She is currently seeing GI however for ongoing workup however I will check for possibly celiac disease as she is endorsing increased bloating and discomfort.   Hyperlipidemia Chronic. She takes atorvastatin and Lovaza. She endorses medication compliance. Denies statin intolerance.   Depression She has a history of depression. She has taken ativan and pamelor in the past. She no longer takes any medication for depression and endorses mood stability. Although she is tearful today she states it is because she is frustrated with her current GI issues. She denies any suicidal ideation.  Depression screen PHQ 2/9 11/11/2017  Decreased Interest 1  Down, Depressed, Hopeless 2  PHQ - 2 Score 3  Altered sleeping 0  Tired, decreased energy 0  Change in appetite 0  Feeling bad or failure about yourself  2  Trouble concentrating 0  Moving slowly or fidgety/restless 0  Suicidal thoughts 1  PHQ-9 Score 6  Difficult doing work/chores -   Review of Systems  Constitutional: Negative for fever, malaise/fatigue and weight loss.  Respiratory: Negative.  Negative for cough and shortness of breath.   Cardiovascular: Negative.  Negative for chest pain, palpitations and leg swelling.  Gastrointestinal: Positive for abdominal pain and constipation. Negative for blood in stool, diarrhea, heartburn, melena, nausea and vomiting.       SEE HPI  Genitourinary:       SEE  HPI  Musculoskeletal: Positive for myalgias.  Neurological: Negative.  Negative for dizziness and headaches.  Psychiatric/Behavioral: Positive for depression. Negative for suicidal ideas.    Past Medical History:  Diagnosis Date  . Abnormal Pap smear     . Depression   . Fibromyalgia   . Hyperlipemia   . Low blood pressure   . Pyloric ulcer associated with Helicobacter pylori, acute 05/22/2017   patient not sure if she has an ulcer - first dx  . SVD (spontaneous vaginal delivery)    x 4  . Tuberculosis    47 yrs old    Past Surgical History:  Procedure Laterality Date  . CESAREAN SECTION    . COLONOSCOPY    . GYNECOLOGIC CRYOSURGERY    . LAPAROSCOPIC ASSISTED VAGINAL HYSTERECTOMY N/A 05/26/2017   Procedure: LAPAROSCOPIC ASSISTED VAGINAL HYSTERECTOMY;  Surgeon: [name], MD;  Location: [facility];  Service: Gynecology;  Laterality: N/A;  . LAPAROSCOPIC BILATERAL SALPINGECTOMY Bilateral 05/26/2017   Procedure: LAPAROSCOPIC BILATERAL SALPINGECTOMY;  Surgeon: [name], MD;  Location: [facility];  Service: Gynecology;  Laterality: Bilateral;    Family History  Problem Relation Age of Onset  . Diabetes Father   . Hyperlipidemia Father   . Hyperlipidemia Mother   . Uterine cancer Paternal Aunt   . Ulcerative colitis Cousin   . Anesthesia problems Neg Hx   . Colon cancer Neg Hx   . Rectal cancer Neg Hx   . Esophageal cancer Neg Hx   . Liver cancer Neg Hx     Social History Reviewed with no changes to be made today.   Outpatient Medications Prior to Visit  Medication Sig Dispense Refill  . atorvastatin (LIPITOR) 20 MG tablet Take 1 tablet (20 mg total) by mouth daily. 30 tablet 2  . Cholecalciferol (VITAMIN D3) 2000 units TABS Take 2,000 Units by mouth daily.    . Multiple Vitamin (MULTIVITAMIN WITH MINERALS) TABS tablet Take 1 tablet by mouth daily.    Marland Kitchen omega-3 acid ethyl esters (LOVAZA) 1 g capsule Take by mouth 2 (two) times daily.    Marland Kitchen omeprazole (PRILOSEC) 40 MG capsule Take 1 capsule (40 mg total) by mouth 2 (two) times daily. 30-60 mins before breakfast and dinner 60 capsule 2  . AMBULATORY NON FORMULARY MEDICATION Medication Name: Nitroglycerin ointment 0.125% Use pea sized amount rectally twice a day  (Patient not taking: Reported on 11/06/2017) 30 g 2  . sucralfate (CARAFATE) 1 g tablet Take 1 tablet (1 g total) by mouth 4 (four) times daily -  with meals and at bedtime. (Patient not taking: Reported on 11/06/2017) 120 tablet 3   No facility-administered medications prior to visit.     Allergies  Allergen Reactions  . Tylenol [Acetaminophen] Rash       Objective:    BP 105/62 (BP Location: Left Arm, Patient Position: Sitting, Cuff Size: Normal)   Pulse 81   Temp 98.6 F (37 C) (Oral)   Ht 5\' 1"  (1.549 m)   Wt 155 lb 3.2 oz (70.4 kg)   LMP 05/01/2017   SpO2 97%   BMI 29.32 kg/m  Wt Readings from Last 3 Encounters:  11/11/17 155 lb 3.2 oz (70.4 kg)  11/06/17 154 lb 6.4 oz (70 kg)  11/05/17 153 lb (69.4 kg)    Physical Exam  Constitutional: She is oriented to person, place, and time. She appears well-developed and well-nourished. She is cooperative.  HENT:  Head: Normocephalic and atraumatic.  Cardiovascular: Normal rate, regular rhythm and normal  heart sounds. Exam reveals no gallop and no friction rub.  No murmur heard. Pulmonary/Chest: Effort normal and breath sounds normal. No tachypnea. No respiratory distress. She has no decreased breath sounds. She has no wheezes. She has no rhonchi. She has no rales. She exhibits no tenderness.  Abdominal: Soft. Bowel sounds are normal. She exhibits no distension. There is no rebound and no guarding.  Musculoskeletal: Normal range of motion. She exhibits no edema.  Neurological: She is alert and oriented to person, place, and time. Coordination normal.  Skin: Skin is warm and dry.  Psychiatric: Her speech is normal and behavior is normal. Judgment and thought content normal. Cognition and memory are normal. She exhibits a depressed mood (tearful).  Nursing note and vitals reviewed.        Patient has been counseled extensively about nutrition and exercise as well as the importance of adherence with medications and regular  follow-up. The patient was given clear instructions to go to ER or return to medical center if symptoms don't improve, worsen or new problems develop. The patient verbalized understanding.   Follow-up: Return if symptoms worsen or fail to improve.   Gildardo Pounds, FNP-BC [facility] [place], [place]   11/14/2017, 10:21 PM

## 2017-11-11 NOTE — Patient Instructions (Addendum)
Celiac Disease Celiac disease is an allergy to the protein that is called gluten. When a person with celiac disease eats a food that has gluten in it, his or her natural defense system (immune system) attacks the cells that line the small intestine. Over time, this reaction damages the small intestine and makes the small intestine unable to absorb nutrients from food. Gluten is found in wheat, rye, and barley and in foods like pasta, pizza, and cereal. Celiac disease is also known as celiac sprue, nontropical sprue, and gluten-sensitive enteropathy. What are the causes? This condition is caused by a gene that is passed down through families (inherited). What increases the risk? This condition is more likely to develop in people who have a family member with the disease. What are the signs or symptoms? Symptoms of this condition include:  Recurring bloating and pain in the abdomen.  Gas.  Long-term (chronic) diarrhea.  Pale, bad-smelling, greasy, or oily stool.  Weight loss.  Missed menstrual periods.  Weakening bones (osteoporosis).  Fatigue and weakness.  Tingling or other signs of nerve damage.  Depression.  Poor appetite.  Rash.  In some cases, there are no symptoms. How is this diagnosed? This condition is diagnosed with a physical exam and tests. Tests may include:  Blood tests to check for nutritional deficiencies.  Blood tests to look for evidence that the body is attacking cells in the small intestine.  A test in which a sample of tissue is taken from the small bowel and examined under a microscope (biopsy).  X-rays of the bowel.  Stool tests.  Tests to check for nutrient absorption from the intestine.  How is this treated? There is no cure for this condition, but it can be managed with a gluten-free diet. Treatment may also involve avoiding dairy foods, such as milk and cheese, because they are hard to digest. Most people who follow a gluten-free diet feel  better and stop having symptoms. The intestine usually heals within 3 months to 2 years. In a small percentage of people, this condition does not improve on the gluten-free diet. If your condition does not improve, more tests will be done. You will also need to work with a specialist in celiac disease to find the best treatment for you. Follow these instructions at home:  Follow instructions from your health care provider about diet.  Monitor your body's response to the gluten-free diet. Write down any changes in your symptoms and changes in how you feel.  If you decide to eat outside of the home, prepare your meal ahead of time, or make sure that the place where you are going has gluten-free options.  Keep all follow-up visits as told by your health care provider. This is important.  Suggest to family members that they get screened for early signs of the disease. Contact a health care provider if:  You continue to have symptoms, even when you are eating a gluten-free diet.  You have trouble sticking to the gluten-free diet.  You develop an itchy rash with groups of tiny blisters.  You develop severe weakness.  You develop balance problems.  You develop new symptoms. This information is not intended to replace advice given to you by your health care provider. Make sure you discuss any questions you have with your health care provider. Document Released: 09/15/2005 Document Revised: 02/21/2016 Document Reviewed: 01/08/2015 Elsevier Interactive Patient Education  2018 [employer]. Celiac Disease Antibodies Test Why am I having this test? The celiac disease  antibodies test is a blood test used to help determine if you have celiac disease. Antibodies are proteins that your body makes to protect your body from germs and other things that can make you sick. With celiac disease, the body produces antibodies in response to the proteins gluten and gliadin. Instead of protecting your body,  these antibodies attack the lining of your intestine. The celiac disease antibodies test looks for antibodies common to celiac disease. You may have a celiac disease antibodies test if you have symptoms of celiac disease. These include:  Long-lasting (chronic) diarrhea.  Belly (abdominal) pain.  Weight loss.  You may also have this test if you are related to someone who has celiac disease. What kind of sample is taken? A blood sample is required for this test. It is usually collected by inserting a needle into a vein. How do I prepare for this test? You may be asked to provide a list of foods that you have eaten in the 48 hours before the test. If you have eaten foods that contain gluten, the test will show a strong antibody response if you do have celiac disease. Check with your health care provider for specific instructions. What are the reference ranges? Reference ranges are considered healthy ranges established after testing a large group of healthy people. Reference ranges may vary among different people, labs, and hospitals. It is your responsibility to obtain your test results. Ask the lab or department performing the test when and how you will get your results. Three antibodies are common to celiac disease:  Gliadin IgA and IgG.  Endomysial IgA.  Tissue transglutaminase IgA.  The reference ranges for these three antibodies are as follows:  Gliadin IgA and IgG: ? Birth to 46 years of age, less than 65 EU. ? 14 years of age and older, less than 25 EU.  Endomysial IgA: All ages, negative.  Tissue transglutaminase IgA: All ages, less than 20 EU.  What do the results mean? High levels of antibodies or a positive result may mean that you have celiac disease. Talk with your health care provider to discuss your results, treatment options, and if necessary, the need for more tests. Talk with your health care provider if you have any questions about your results. Talk with your  health care provider to discuss your results, treatment options, and if necessary, the need for more tests. Talk with your health care provider if you have any questions about your results. This information is not intended to replace advice given to you by your health care provider. Make sure you discuss any questions you have with your health care provider. Document Released: 10/09/2004 Document Revised: 05/20/2016 Document Reviewed: 01/11/2014 Elsevier Interactive Patient Education  2018 [employer]. Celiac Disease Antibodies Test Why am I having this test? The celiac disease antibodies test is a blood test used to help determine if you have celiac disease. Antibodies are proteins that your body makes to protect your body from germs and other things that can make you sick. With celiac disease, the body produces antibodies in response to the proteins gluten and gliadin. Instead of protecting your body, these antibodies attack the lining of your intestine. The celiac disease antibodies test looks for antibodies common to celiac disease. You may have a celiac disease antibodies test if you have symptoms of celiac disease. These include:  Long-lasting (chronic) diarrhea.  Belly (abdominal) pain.  Weight loss.  You may also have this test if you are related to someone  who has celiac disease. What kind of sample is taken? A blood sample is required for this test. It is usually collected by inserting a needle into a vein. How do I prepare for this test? You may be asked to provide a list of foods that you have eaten in the 48 hours before the test. If you have eaten foods that contain gluten, the test will show a strong antibody response if you do have celiac disease. Check with your health care provider for specific instructions. What are the reference ranges? Reference ranges are considered healthy ranges established after testing a large group of healthy people. Reference ranges may vary among  different people, labs, and hospitals. It is your responsibility to obtain your test results. Ask the lab or department performing the test when and how you will get your results. Three antibodies are common to celiac disease:  Gliadin IgA and IgG.  Endomysial IgA.  Tissue transglutaminase IgA.  The reference ranges for these three antibodies are as follows:  Gliadin IgA and IgG: ? Birth to 46 years of age, less than 28 EU. ? 58 years of age and older, less than 25 EU.  Endomysial IgA: All ages, negative.  Tissue transglutaminase IgA: All ages, less than 20 EU.  What do the results mean? High levels of antibodies or a positive result may mean that you have celiac disease. Talk with your health care provider to discuss your results, treatment options, and if necessary, the need for more tests. Talk with your health care provider if you have any questions about your results. Talk with your health care provider to discuss your results, treatment options, and if necessary, the need for more tests. Talk with your health care provider if you have any questions about your results. This information is not intended to replace advice given to you by your health care provider. Make sure you discuss any questions you have with your health care provider. Document Released: 10/09/2004 Document Revised: 05/20/2016 Document Reviewed: 01/11/2014 Elsevier Interactive Patient Education  2018 [employer].  Celiac Disease Celiac disease is an allergy to the protein that is called gluten. When a person with celiac disease eats a food that has gluten in it, his or her natural defense system (immune system) attacks the cells that line the small intestine. Over time, this reaction damages the small intestine and makes the small intestine unable to absorb nutrients from food. Gluten is found in wheat, rye, and barley and in foods like pasta, pizza, and cereal. Celiac disease is also known as celiac sprue,  nontropical sprue, and gluten-sensitive enteropathy. What are the causes? This condition is caused by a gene that is passed down through families (inherited). What increases the risk? This condition is more likely to develop in people who have a family member with the disease. What are the signs or symptoms? Symptoms of this condition include:  Recurring bloating and pain in the abdomen.  Gas.  Long-term (chronic) diarrhea.  Pale, bad-smelling, greasy, or oily stool.  Weight loss.  Missed menstrual periods.  Weakening bones (osteoporosis).  Fatigue and weakness.  Tingling or other signs of nerve damage.  Depression.  Poor appetite.  Rash.  In some cases, there are no symptoms. How is this diagnosed? This condition is diagnosed with a physical exam and tests. Tests may include:  Blood tests to check for nutritional deficiencies.  Blood tests to look for evidence that the body is attacking cells in the small intestine.  A test  in which a sample of tissue is taken from the small bowel and examined under a microscope (biopsy).  X-rays of the bowel.  Stool tests.  Tests to check for nutrient absorption from the intestine.  How is this treated? There is no cure for this condition, but it can be managed with a gluten-free diet. Treatment may also involve avoiding dairy foods, such as milk and cheese, because they are hard to digest. Most people who follow a gluten-free diet feel better and stop having symptoms. The intestine usually heals within 3 months to 2 years. In a small percentage of people, this condition does not improve on the gluten-free diet. If your condition does not improve, more tests will be done. You will also need to work with a specialist in celiac disease to find the best treatment for you. Follow these instructions at home:  Follow instructions from your health care provider about diet.  Monitor your body's response to the gluten-free diet. Write  down any changes in your symptoms and changes in how you feel.  If you decide to eat outside of the home, prepare your meal ahead of time, or make sure that the place where you are going has gluten-free options.  Keep all follow-up visits as told by your health care provider. This is important.  Suggest to family members that they get screened for early signs of the disease. Contact a health care provider if:  You continue to have symptoms, even when you are eating a gluten-free diet.  You have trouble sticking to the gluten-free diet.  You develop an itchy rash with groups of tiny blisters.  You develop severe weakness.  You develop balance problems.  You develop new symptoms. This information is not intended to replace advice given to you by your health care provider. Make sure you discuss any questions you have with your health care provider. Document Released: 09/15/2005 Document Revised: 02/21/2016 Document Reviewed: 01/08/2015 Elsevier Interactive Patient Education  2018 [employer].  Vaginitis Vaginitis is an inflammation of the vagina. It can happen when the normal bacteria and yeast in the vagina grow too much. There are different types. Treatment will depend on the type you have. Follow these instructions at home:  Take all medicines as told by your doctor.  Keep your vagina area clean and dry. Avoid soap. Rinse the area with water.  Avoid washing and cleaning out the vagina (douching).  Do not use tampons or have sex (intercourse) until your treatment is done.  Wipe from front to back after going to the restroom.  Wear cotton underwear.  Avoid wearing underwear while you sleep until your vaginitis is gone.  Avoid tight pants. Avoid underwear or nylons without a cotton panel.  Take off wet clothing (such as a bathing suit) as soon as you can.  Use mild, unscented products. Avoid fabric softeners and scented: ? Feminine sprays. ? Laundry  detergents. ? Tampons. ? Soaps or bubble baths.  Practice safe sex and use condoms. Get help right away if:  You have belly (abdominal) pain.  You have a fever or lasting symptoms for more than 2-3 days.  You have a fever and your symptoms suddenly get worse. This information is not intended to replace advice given to you by your health care provider. Make sure you discuss any questions you have with your health care provider. Document Released: 12/12/2008 Document Revised: 02/21/2016 Document Reviewed: 02/26/2012 Elsevier Interactive Patient Education  2017 [employer].

## 2017-11-12 LAB — CERVICOVAGINAL ANCILLARY ONLY
BACTERIAL VAGINITIS: NEGATIVE
CANDIDA VAGINITIS: NEGATIVE
CHLAMYDIA, DNA PROBE: NEGATIVE
NEISSERIA GONORRHEA: NEGATIVE
Trichomonas: NEGATIVE

## 2017-11-13 LAB — GLIA (IGA/G) + TTG IGA
Antigliadin Abs, IgA: 4 units (ref 0–19)
Gliadin IgG: 3 units (ref 0–19)
Transglutaminase IgA: <2 U/mL (ref 0–3)

## 2017-11-14 ENCOUNTER — Encounter: Payer: Self-pay | Admitting: Nurse Practitioner

## 2017-11-14 DIAGNOSIS — F321 Major depressive disorder, single episode, moderate: Secondary | ICD-10-CM | POA: Insufficient documentation

## 2017-11-16 ENCOUNTER — Telehealth: Payer: Self-pay

## 2017-11-16 NOTE — Telephone Encounter (Signed)
CMA called patient to inform on lab result.  Patient understand.  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   Patient would like PCP advised to see what can she do next.

## 2017-11-16 NOTE — Telephone Encounter (Signed)
-----   Message from Gildardo Pounds, NP sent at 11/14/2017  2:25 PM EST ----- Your wet prep was negative for yeast or bacteria and your labs do not show celiac disease.

## 2017-11-17 ENCOUNTER — Other Ambulatory Visit: Payer: Self-pay

## 2017-11-17 ENCOUNTER — Other Ambulatory Visit: Payer: Self-pay | Admitting: Nurse Practitioner

## 2017-11-17 MED ORDER — ACYCLOVIR 400 MG PO TABS: 400.0000 mg | ORAL_TABLET | Freq: Three times a day (TID) | ORAL | 0 refills | Status: DC

## 2017-11-17 MED ORDER — ACYCLOVIR 400 MG PO TABS: 400.0000 mg | ORAL_TABLET | Freq: Three times a day (TID) | ORAL | 0 refills | Status: AC

## 2017-11-17 MED FILL — ?ACYCLOVIR 400 TAB: 400 | 7 days supply | Qty: 21 | Fill #0

## 2017-11-17 NOTE — Telephone Encounter (Signed)
Pt. Called and requested for her acyclovir (ZOVIRAX) 400 MG tablet Be sent to [facility] pharmacy. Please f/u with pt.

## 2017-11-17 NOTE — Telephone Encounter (Signed)
CMA sent the Rx to [facility] pharmacy and called patient back to inform her Rx has been sent.

## 2017-11-17 NOTE — Telephone Encounter (Signed)
There is no cure. Medication is given for outbreaks. Avoiding stress may lessen the occurrences of outbreaks. Has she taken her medication? Has she noticed any improvement of symptoms?

## 2017-11-17 NOTE — Telephone Encounter (Signed)
Patient called to request Acyclovir to be send to [facility].   CMA called to inform patient Rx has been sent.

## 2017-11-17 NOTE — Telephone Encounter (Signed)
CMA attempt to call patient to inform her Rx has been sent.  No answer and left a Vm.

## 2017-11-18 ENCOUNTER — Ambulatory Visit: Payer: Self-pay | Admitting: Nurse Practitioner

## 2017-11-18 NOTE — Telephone Encounter (Signed)
NOTED

## 2017-11-23 ENCOUNTER — Ambulatory Visit: Payer: Self-pay | Admitting: Nurse Practitioner

## 2017-11-24 ENCOUNTER — Ambulatory Visit: Payer: Self-pay | Attending: Nurse Practitioner

## 2017-11-25 ENCOUNTER — Ambulatory Visit (AMBULATORY_SURGERY_CENTER): Payer: Self-pay | Admitting: Gastroenterology

## 2017-11-25 ENCOUNTER — Other Ambulatory Visit: Payer: Self-pay

## 2017-11-25 ENCOUNTER — Encounter: Payer: Self-pay | Admitting: Gastroenterology

## 2017-11-25 VITALS — BP 106/64 | HR 69 | Temp 98.4°F | Resp 17 | Ht 61.0 in | Wt 153.0 lb

## 2017-11-25 DIAGNOSIS — K317 Polyp of stomach and duodenum: Secondary | ICD-10-CM

## 2017-11-25 DIAGNOSIS — K2990 Gastroduodenitis, unspecified, without bleeding: Secondary | ICD-10-CM

## 2017-11-25 DIAGNOSIS — R1310 Dysphagia, unspecified: Secondary | ICD-10-CM

## 2017-11-25 DIAGNOSIS — K2970 Gastritis, unspecified, without bleeding: Secondary | ICD-10-CM

## 2017-11-25 MED ORDER — SODIUM CHLORIDE 0.9 % IV SOLN: 500.0000 mL | Freq: Once | INTRAVENOUS | Status: DC

## 2017-11-25 NOTE — Progress Notes (Signed)
Called to room to assist during endoscopic procedure.  Patient ID and intended procedure confirmed with present staff. Received instructions for my participation in the procedure from the performing physician.  

## 2017-11-25 NOTE — Op Note (Signed)
[facility] Patient Name: Aimee Figueroa Procedure Date: 11/25/2017 9:42 AM MRN: [id number] Endoscopist: Mauri Pole , MD Age: 47 Referring MD:  Date of Birth: 1971-05-30 Gender: Female Account #: [id number] Procedure:                Upper GI endoscopy Indications:              Dysphagia, Epigastric abdominal pain, Abdominal                            pain in the left upper quadrant Medicines:                Monitored Anesthesia Care Procedure:                Pre-Anesthesia Assessment:                           - Prior to the procedure, a History and Physical                            was performed, and patient medications and                            allergies were reviewed. The patient's tolerance of                            previous anesthesia was also reviewed. The risks                            and benefits of the procedure and the sedation                            options and risks were discussed with the patient.                            All questions were answered, and informed consent                            was obtained. Prior Anticoagulants: The patient has                            taken no previous anticoagulant or antiplatelet                            agents. ASA Grade Assessment: II - A patient with                            mild systemic disease. After reviewing the risks                            and benefits, the patient was deemed in                            satisfactory condition to undergo the procedure.  After obtaining informed consent, the endoscope was                            passed under direct vision. Throughout the                            procedure, the patient's blood pressure, pulse, and                            oxygen saturations were monitored continuously. The                            Endoscope was introduced through the mouth, and   advanced to the second part of duodenum. The upper                            GI endoscopy was accomplished without difficulty.                            The patient tolerated the procedure well. Scope In: Scope Out: Findings:                 Esophagogastric landmarks were identified: the                            Z-line was found at 35 cm and the gastroesophageal                            junction was found at 35 cm from the incisors.                           No endoscopic abnormality was evident in the                            esophagus to explain the patient's complaint of                            dysphagia. It was decided, however, to proceed with                            dilation of the lower third of the esophagus. A TTS                            dilator was passed through the scope. Dilation with                            an 18-19-20 mm balloon dilator was performed to 20                            mm. The dilation site was examined following                            endoscope reinsertion and showed no change.  Patchy severe inflammation with hemorrhage                            characterized by congestion (edema), erosions,                            erythema and friability was found in the gastric                            antrum. Biopsies were taken with a cold forceps for                            Helicobacter pylori testing.                           A few 5 to 9 mm sessile polyps with no bleeding and                            no stigmata of recent bleeding were found in the                            gastric fundus. These polyps were removed with a                            cold snare. Resection and retrieval were complete.                           The examined duodenum was normal. Complications:            No immediate complications. Estimated Blood Loss:     Estimated blood loss was minimal. Impression:               -  Esophagogastric landmarks identified.                           - No endoscopic esophageal abnormality to explain                            patient's dysphagia. Esophagus dilated. Dilated.                           - Gastritis with hemorrhage. Biopsied.                           - A few gastric polyps. Resected and retrieved.                           - Normal examined duodenum. Recommendation:           - Patient has a contact number available for                            emergencies. The signs and symptoms of potential                            delayed  complications were discussed with the                            patient. Return to normal activities tomorrow.                            Written discharge instructions were provided to the                            patient.                           - Resume previous diet.                           - Continue present medications.                           - Await pathology results. Mauri Pole, MD 11/25/2017 10:16:21 AM This report has been signed electronically.

## 2017-11-25 NOTE — Progress Notes (Signed)
To recovery, report to RN, VSS. 

## 2017-11-25 NOTE — Patient Instructions (Signed)
Impression/Recommendations:  Dilation diet handout given to patient. Gastritis handout given to patient.  Resume previous diet. Continue present medications.  Await pathology results.  YOU HAD AN ENDOSCOPIC PROCEDURE TODAY AT [facility]:   Refer to the procedure report that was given to you for any specific questions about what was found during the examination.  If the procedure report does not answer your questions, please call your gastroenterologist to clarify.  If you requested that your care partner not be given the details of your procedure findings, then the procedure report has been included in a sealed envelope for you to review at your convenience later.  YOU SHOULD EXPECT: Some feelings of bloating in the abdomen. Passage of more gas than usual.  Walking can help get rid of the air that was put into your GI tract during the procedure and reduce the bloating. If you had a lower endoscopy (such as a colonoscopy or flexible sigmoidoscopy) you may notice spotting of blood in your stool or on the toilet paper. If you underwent a bowel prep for your procedure, you may not have a normal bowel movement for a few days.  Please Note:  You might notice some irritation and congestion in your nose or some drainage.  This is from the oxygen used during your procedure.  There is no need for concern and it should clear up in a day or so.  SYMPTOMS TO REPORT IMMEDIATELY:  Following upper endoscopy (EGD)  Vomiting of blood or coffee ground material  New chest pain or pain under the shoulder blades  Painful or persistently difficult swallowing  New shortness of breath  Fever of 100F or higher  Black, tarry-looking stools  For urgent or emergent issues, a gastroenterologist can be reached at any hour by calling [phone].   DIET:  We do recommend a small meal at first, but then you may proceed to your regular diet.  Drink plenty of fluids but you should avoid alcoholic  beverages for 24 hours.  ACTIVITY:  You should plan to take it easy for the rest of today and you should NOT DRIVE or use heavy machinery until tomorrow (because of the sedation medicines used during the test).    FOLLOW UP: Our staff will call the number listed on your records the next business day following your procedure to check on you and address any questions or concerns that you may have regarding the information given to you following your procedure. If we do not reach you, we will leave a message.  However, if you are feeling well and you are not experiencing any problems, there is no need to return our call.  We will assume that you have returned to your regular daily activities without incident.  If any biopsies were taken you will be contacted by phone or by letter within the next 1-3 weeks.  Please call us at [phone] if you have not heard about the biopsies in 3 weeks.    SIGNATURES/CONFIDENTIALITY: You and/or your care partner have signed paperwork which will be entered into your electronic medical record.  These signatures attest to the fact that that the information above on your After Visit Summary has been reviewed and is understood.  Full responsibility of the confidentiality of this discharge information lies with you and/or your care-partner.

## 2017-11-26 ENCOUNTER — Telehealth: Payer: Self-pay

## 2017-11-26 ENCOUNTER — Ambulatory Visit: Payer: Self-pay

## 2017-11-26 NOTE — Telephone Encounter (Signed)
  Follow up Call-  Call back number 11/25/2017 05/08/2017  Post procedure Call Back phone  # [phone]  Permission to leave phone message Yes Yes  Some recent data might be hidden     Patient questions:  Do you have a fever, pain , or abdominal swelling? No. Pain Score  0 *  Have you tolerated food without any problems? Yes.    Have you been able to return to your normal activities? Yes.    Do you have any questions about your discharge instructions: Diet   No. Medications  No. Follow up visit  No.  Do you have questions or concerns about your Care? Yes.    Actions: * If pain score is 4 or above: No action needed, pain <4.  Pt said her throat was a little sore.  I explained this was not uncommon when the esophagus is stretched.  I recommended she try a warm water gargle.  She may try OTC sore throat lozenges.  To call us back if needed.  Pt asked when she will get biopsies results.  I explained may take approximately two weeks, either a phone call or a letter in the mail.  Pt to call us back if she does not receive results by week three.

## 2017-12-03 ENCOUNTER — Other Ambulatory Visit: Payer: Self-pay

## 2017-12-03 ENCOUNTER — Telehealth: Payer: Self-pay | Admitting: Physician Assistant

## 2017-12-03 MED ORDER — DOXYCYCLINE HYCLATE 100 MG PO CAPS: 100.0000 mg | ORAL_CAPSULE | Freq: Two times a day (BID) | ORAL | 0 refills | Status: DC

## 2017-12-03 MED ORDER — METRONIDAZOLE 250 MG PO TABS: 250.0000 mg | ORAL_TABLET | Freq: Four times a day (QID) | ORAL | 0 refills | Status: AC

## 2017-12-03 MED ORDER — BISMUTH SUBSALICYLATE 262 MG PO CHEW: 524.0000 mg | CHEWABLE_TABLET | Freq: Four times a day (QID) | ORAL | 0 refills | Status: DC

## 2017-12-03 MED ORDER — BIS SUBCIT-METRONID-TETRACYC 140-125-125 MG PO CAPS: 3.0000 | ORAL_CAPSULE | Freq: Three times a day (TID) | ORAL | 0 refills | Status: DC

## 2017-12-03 NOTE — Telephone Encounter (Signed)
Pt wants to speak about medication. Pt does not remember which medication but she needs it send to [facility] on [address].

## 2017-12-03 NOTE — Telephone Encounter (Signed)
Beth this pt had a procedure with Dr Silverio Decamp and she is calling regarding her medication that was suppose to be sent.

## 2017-12-03 NOTE — Telephone Encounter (Signed)
[facility] has told the patient they did not receive any Rx's from [place]. Epic confirms receipt at 9:14 am. Called to pharmacy and used the doctor line. No one picks up and the phone system says "no operator available." New rx sent to [facility] per patient request. Doxycycline 100 mg BID for 10 days Flagyl 250 mg QID for 10 days Bismuth 524 mg QID for 10 days. Continue Omeprazole 40 mg BID

## 2017-12-14 ENCOUNTER — Telehealth: Payer: Self-pay | Admitting: Gastroenterology

## 2017-12-14 NOTE — Telephone Encounter (Signed)
Despite careful explaining of the medications, the patient did not take Prilosec 40 mg BID while on her antibiotics. She also tells me she only has Prilosec 20 mg. She was taking Bismuth while on antibiotics.  She becomes very tearful when she realizes she made a mistake. She will take Prilosec 20 mg daily for the next 4 weeks. She will then wait for 2 weeks off Prilosec. She will then come to the lab for retesting by stool specimen.

## 2017-12-20 NOTE — Telephone Encounter (Signed)
Ok thanks 

## 2017-12-23 ENCOUNTER — Ambulatory Visit: Payer: Self-pay | Attending: Nurse Practitioner

## 2017-12-23 MED FILL — ?OMEPRAZOLE DR 20MG CAPSULE: 20 | 30 days supply | Qty: 30 | Fill #0

## 2018-01-01 ENCOUNTER — Ambulatory Visit: Payer: Self-pay | Admitting: Nurse Practitioner

## 2018-01-11 ENCOUNTER — Telehealth: Payer: Self-pay | Admitting: Gastroenterology

## 2018-01-11 NOTE — Telephone Encounter (Signed)
Spoke with the patient. She is now off of Omeprazole as of today. Counting from today, she will stay off Omeprazole for 2 weeks. She will obtain the stool specimen after 01/26/18 for her follow up testing of H. Pylori. After the specimen is obtained she may take Omeprazole if she needs to.

## 2018-01-22 ENCOUNTER — Other Ambulatory Visit: Payer: Self-pay

## 2018-01-22 DIAGNOSIS — A048 Other specified bacterial intestinal infections: Secondary | ICD-10-CM

## 2018-01-28 ENCOUNTER — Other Ambulatory Visit: Payer: Self-pay

## 2018-01-28 DIAGNOSIS — A048 Other specified bacterial intestinal infections: Secondary | ICD-10-CM

## 2018-02-01 LAB — H. PYLORI ANTIGEN, STOOL: H PYLORI AG STL: NEGATIVE

## 2018-02-05 ENCOUNTER — Telehealth: Payer: Self-pay | Admitting: Gastroenterology

## 2018-02-16 ENCOUNTER — Ambulatory Visit (INDEPENDENT_AMBULATORY_CARE_PROVIDER_SITE_OTHER): Payer: Self-pay | Admitting: Physician Assistant

## 2018-02-16 ENCOUNTER — Encounter: Payer: Self-pay | Admitting: Physician Assistant

## 2018-02-16 VITALS — BP 120/60 | HR 79 | Ht 61.0 in | Wt 156.0 lb

## 2018-02-16 DIAGNOSIS — R1012 Left upper quadrant pain: Secondary | ICD-10-CM

## 2018-02-16 MED ORDER — DICYCLOMINE HCL 20 MG PO TABS: 20.0000 mg | ORAL_TABLET | Freq: Three times a day (TID) | ORAL | 2 refills | Status: DC

## 2018-02-16 NOTE — Progress Notes (Signed)
Chief Complaint: Abdominal pain  HPI:     Aimee Figueroa is a 47 year old hispanic female with a past medical history as listed below including fibromyalgia, known to Dr. Silverio Decamp, who presents to clinic today with a complaint of abdominal pain.      Please see previous records patient has had extensive work-up for left upper quadrant abdominal pain in the past.    EGD 11/25/2017 with empiric dilation of the esophagus, gastritis with hemorrhage and gastric polyps.  Pathology was positive for H. pylori.  Patient was treated.    Patient has called our clinic recently with continued complaints of abdominal pain.  Patient had H. pylori stool antigen ordered 2 weeks ago which was negative.    Per review of chart patient recently saw PCP 12/29/2017 and irritable bowel syndrome with both constipation and diarrhea was discussed as well as interstitial cystitis and chronic pelvic pain.    Today, continues with a left upper quadrant pain which sometimes radiates across her stomach and into her back.  Patient feels like there is "something stuck in there".  This feeling is worsened when she bends over.  It is constant and a 4-5/10 and sometimes worse.  Patient describes various feelings between "feeling empty/Iheavy/bloated".  This seems to be somewhat worsened by food.  Also worsened by increase in anxiety/stress.  Tells me she is not able to do her daily chores at home and has 4 children.    Does describe that her stools are now better she has a solid stool every day if not two and she no longer has any rectal pressure.    Denies fever, chills, weight loss or symptoms that awaken her at night.  Past Medical History:  Diagnosis Date  . Abnormal Pap smear   . Allergy    seasonal  . Anxiety   . Depression   . Fibromyalgia   . Hyperlipemia   . Low blood pressure   . Pyloric ulcer associated with Helicobacter pylori, acute 05/22/2017   patient not sure if she has an ulcer - first dx  . SVD  (spontaneous vaginal delivery)    x 4  . Tuberculosis    47 yrs old    Past Surgical History:  Procedure Laterality Date  . CESAREAN SECTION    . COLONOSCOPY    . GYNECOLOGIC CRYOSURGERY    . LAPAROSCOPIC ASSISTED VAGINAL HYSTERECTOMY N/A 05/26/2017   Procedure: LAPAROSCOPIC ASSISTED VAGINAL HYSTERECTOMY;  Surgeon: [name], MD;  Location: [facility];  Service: Gynecology;  Laterality: N/A;  . LAPAROSCOPIC BILATERAL SALPINGECTOMY Bilateral 05/26/2017   Procedure: LAPAROSCOPIC BILATERAL SALPINGECTOMY;  Surgeon: [name], MD;  Location: [facility];  Service: Gynecology;  Laterality: Bilateral;    Current Outpatient Medications  Medication Sig Dispense Refill  . AMBULATORY NON FORMULARY MEDICATION Medication Name: Nitroglycerin ointment 0.125% Use pea sized amount rectally twice a day 30 g 2  . atorvastatin (LIPITOR) 20 MG tablet Take 1 tablet (20 mg total) by mouth daily. 30 tablet 2  . bismuth subsalicylate (PEPTO-BISMOL) 262 MG chewable tablet Chew 2 tablets (524 mg total) by mouth 4 (four) times daily. Take with antibiotics 30 tablet 0  . Cholecalciferol (VITAMIN D3) 2000 units TABS Take 2,000 Units by mouth daily.    Marland Kitchen doxycycline (VIBRAMYCIN) 100 MG capsule Take 1 capsule (100 mg total) by mouth 2 (two) times daily. 20 capsule 0  . Multiple Vitamin (MULTIVITAMIN WITH MINERALS) TABS tablet Take 1 tablet by mouth daily.    Marland Kitchen omega-3 acid  ethyl esters (LOVAZA) 1 g capsule Take by mouth 2 (two) times daily.    Marland Kitchen omeprazole (PRILOSEC) 40 MG capsule Take 1 capsule (40 mg total) by mouth 2 (two) times daily. 30-60 mins before breakfast and dinner 60 capsule 2  . sucralfate (CARAFATE) 1 g tablet Take 1 tablet (1 g total) by mouth 4 (four) times daily -  with meals and at bedtime. (Patient not taking: Reported on 11/06/2017) 120 tablet 3   Current Facility-Administered Medications  Medication Dose Route Frequency Provider Last Rate Last Dose  . 0.9 %  sodium chloride infusion  500 mL  Intravenous Once Mauri Pole, MD        Allergies as of 02/16/2018 - Review Complete 11/25/2017  Allergen Reaction Noted  . Tylenol [acetaminophen] Rash 09/17/2011    Family History  Problem Relation Age of Onset  . Diabetes Father   . Hyperlipidemia Father   . Hyperlipidemia Mother   . Uterine cancer Paternal Aunt   . Ulcerative colitis Cousin   . Anesthesia problems Neg Hx   . Colon cancer Neg Hx   . Rectal cancer Neg Hx   . Esophageal cancer Neg Hx   . Liver cancer Neg Hx     Social History   Socioeconomic History  . Marital status: Married    Spouse name: Not on file  . Number of children: 5  . Years of education: Not on file  . Highest education level: Not on file  Occupational History  . Occupation: homemaker  Social Needs  . Financial resource strain: Not on file  . Food insecurity:    Worry: Not on file    Inability: Not on file  . Transportation needs:    Medical: Not on file    Non-medical: Not on file  Tobacco Use  . Smoking status: Never Smoker  . Smokeless tobacco: Never Used  Substance and Sexual Activity  . Alcohol use: No  . Drug use: No  . Sexual activity: Yes    Partners: Male    Birth control/protection: Injection    Comment: depo provera 05/13/17  Lifestyle  . Physical activity:    Days per week: Not on file    Minutes per session: Not on file  . Stress: Not on file  Relationships  . Social connections:    Talks on phone: Not on file    Gets together: Not on file    Attends religious service: Not on file    Active member of club or organization: Not on file    Attends meetings of clubs or organizations: Not on file    Relationship status: Not on file  . Intimate partner violence:    Fear of current or ex partner: Not on file    Emotionally abused: Not on file    Physically abused: Not on file    Forced sexual activity: Not on file  Other Topics Concern  . Not on file  Social History Narrative  . Not on file    Review  of Systems:    Constitutional: No weight loss, fever or chills Cardiovascular: No chest pain Respiratory: No SOB  Gastrointestinal: See HPI and otherwise negative   Physical Exam:  Vital signs: BP 120/60   Pulse 79   Ht 5\' 1"  (1.549 m)   Wt 156 lb (70.8 kg)   LMP 05/01/2017   BMI 29.48 kg/m   Constitutional:   Tearful Hispanic female appears to be in NAD, Well developed, Well nourished, alert  and cooperative Respiratory: Respirations even and unlabored. Lungs clear to auscultation bilaterally.   No wheezes, crackles, or rhonchi.  Cardiovascular: Normal S1, S2. No MRG. Regular rate and rhythm. No peripheral edema, cyanosis or pallor.  Gastrointestinal:  Soft, nondistended, moderate LUQ ttp, No rebound or guarding. Normal bowel sounds. No appreciable masses or hepatomegaly. Musculoskeletal: ttp over left side of rib cage Psychiatric: Demonstrates good judgement and reason without abnormal affect or behaviors.  MOST RECENT LABS AND IMAGING: CBC    Component Value Date/Time   WBC 4.8 10/28/2017 1038   WBC 7.5 05/27/2017 0519   RBC 4.74 10/28/2017 1038   RBC 3.24 (L) 05/27/2017 0519   HGB 15.4 10/28/2017 1038   HCT 43.6 10/28/2017 1038   PLT 177 10/28/2017 1038   MCV 92 10/28/2017 1038   MCH 32.5 10/28/2017 1038   MCH 30.9 05/27/2017 0519   MCHC 35.3 10/28/2017 1038   MCHC 35.0 05/27/2017 0519   RDW 13.9 10/28/2017 1038   LYMPHSABS 2.2 10/28/2017 1038   MONOABS 306 03/25/2017 1547   EOSABS 0.1 10/28/2017 1038   BASOSABS 0.0 10/28/2017 1038    CMP     Component Value Date/Time   NA 142 05/18/2017 1147   K 4.0 05/18/2017 1147   CL 104 05/18/2017 1147   CO2 23 05/18/2017 1147   GLUCOSE 108 (H) 05/18/2017 1147   GLUCOSE 96 04/07/2017 1353   GLUCOSE 80 01/06/2012 1131   BUN 6 05/18/2017 1147   CREATININE 0.53 (L) 05/18/2017 1147   CREATININE 0.51 05/31/2015 1151   CALCIUM 10.2 05/18/2017 1147   PROT 7.4 05/18/2017 1147   ALBUMIN 4.7 05/18/2017 1147   AST 26  05/18/2017 1147   ALT 40 (H) 05/18/2017 1147   ALKPHOS 50 05/18/2017 1147   BILITOT 0.4 05/18/2017 1147   GFRNONAA 115 05/18/2017 1147   GFRNONAA >89 09/06/2014 1551   GFRAA 133 05/18/2017 1147   GFRAA >89 09/06/2014 1551    Assessment: 1.  Left upper quadrant abdominal pain: Chronic, continues, work-up as below; likely functional  Plan: 1.  Pain continues today, again extensive work-up in the past including CT, MRI, colonoscopy, EGD, GYN evaluation, eventual hysterectomy and BSO.  Consider element of musculoskeletal disease/costochondritis versus functional dyspepsia. 2.  Prescribed Dicyclomine 20 mg 4 times daily, 20-30 minutes before meals and at bedtime #120 with 2 refills 3.  Recommend patient start FD Donald Prose 2 tabs twice a day for a month, can decrease from there.  Provided her with samples and coupons. 4.  Patient to continue other current medications. 5.  Did explain to the patient that she should see her PCP regarding her anxiety. 6.  Patient to follow with Dr. Silverio Decamp as scheduled in July.  Aimee Newer, PA-C [facility] 02/16/2018, 1:43 PM  Cc: Gildardo Pounds, NP

## 2018-02-16 NOTE — Patient Instructions (Signed)
FD gard 2 tabs twice a day x 1 month.   We have sent the following medications to your pharmacy for you to pick up at your convenience: Dicyclomine 20 mg 20-30 minutes before meals and at bedtime  See your PCP about your anxiety.

## 2018-02-16 NOTE — Progress Notes (Signed)
Reviewed and agree with documentation and assessment and plan. K. Veena Nandigam , MD   

## 2018-02-17 ENCOUNTER — Telehealth: Payer: Self-pay | Admitting: Physician Assistant

## 2018-02-17 MED ORDER — DICYCLOMINE HCL 20 MG PO TABS: 20.0000 mg | ORAL_TABLET | Freq: Three times a day (TID) | ORAL | 2 refills | Status: DC

## 2018-02-17 MED FILL — DICYCLOMINE HCL 20 MG TABS: 20 | 30 days supply | Qty: 120 | Fill #0

## 2018-02-17 NOTE — Telephone Encounter (Signed)
rx sent to pharmacy

## 2018-03-18 ENCOUNTER — Encounter: Payer: Self-pay | Admitting: Gastroenterology

## 2018-03-18 ENCOUNTER — Ambulatory Visit (INDEPENDENT_AMBULATORY_CARE_PROVIDER_SITE_OTHER): Payer: Self-pay | Admitting: Gastroenterology

## 2018-03-18 VITALS — BP 100/60 | HR 80 | Ht 61.0 in | Wt 152.8 lb

## 2018-03-18 DIAGNOSIS — R198 Other specified symptoms and signs involving the digestive system and abdomen: Secondary | ICD-10-CM

## 2018-03-18 DIAGNOSIS — R109 Unspecified abdominal pain: Secondary | ICD-10-CM | POA: Insufficient documentation

## 2018-03-18 DIAGNOSIS — R140 Abdominal distension (gaseous): Secondary | ICD-10-CM | POA: Insufficient documentation

## 2018-03-18 MED ORDER — BUSPIRONE HCL 7.5 MG PO TABS: 7.5000 mg | ORAL_TABLET | Freq: Two times a day (BID) | ORAL | 1 refills | Status: DC

## 2018-03-18 MED ORDER — HYDROCORTISONE ACETATE 25 MG RE SUPP: 25.0000 mg | Freq: Two times a day (BID) | RECTAL | 0 refills | Status: DC

## 2018-03-18 MED FILL — HYDROCORTISONE ACETATE 25 M: 25 | 5 days supply | Qty: 10 | Fill #0

## 2018-03-18 MED FILL — busPIRone HCL 7.5 MG TABS: 7.5 | 30 days supply | Qty: 60 | Fill #0

## 2018-03-18 NOTE — Progress Notes (Signed)
03/18/2018 [place] [id number] [birth year]   HISTORY OF PRESENT ILLNESS:  This is a 47 year old female who is known to Dr. Silverio Decamp.  She is here today with ongoing complaints of left-sided abdominal pain.  This pain has been present since July 2018 and is associated with burping and bloating.  The pain is constant and dull, not severe and sharp.  It does go around the left side of her back.  She has had extensive evaluation including CT scan, endoscopy, colonoscopy, and GYN evaluation and actually underwent hysterectomy and BSO as well.  The pain has continued.  She tried dicyclomine, which she continues to take 4 times a day and she tried [employer] samples as well.  She says that 1 of those did seem to help to some degree, but she still continues to have symptoms.  Overall moves her bowels well.  Denies any blood in her stools.  She did have diagnosis of H. pylori which is found during her endoscopy, but that was treated and follow-up stool antigen was negative.  Pain is felt to be musculoskeletal or functional.  She also complains of ongoing rectal pressure and fullness.  Was previously treated for an anal fissure, but says that it is just a sensation of pressure or fullness now at this point.   Past Medical History:  Diagnosis Date  . Abnormal Pap smear   . Allergy    seasonal  . Anxiety   . Depression   . Fibromyalgia   . Hyperlipemia   . Low blood pressure   . Pyloric ulcer associated with Helicobacter pylori, acute 05/22/2017   patient not sure if she has an ulcer - first dx  . SVD (spontaneous vaginal delivery)    x 4  . Tuberculosis    47 yrs old   Past Surgical History:  Procedure Laterality Date  . CESAREAN SECTION    . COLONOSCOPY    . GYNECOLOGIC CRYOSURGERY    . LAPAROSCOPIC ASSISTED VAGINAL HYSTERECTOMY N/A 05/26/2017   Procedure: LAPAROSCOPIC ASSISTED VAGINAL HYSTERECTOMY;  Surgeon: [name], MD;  Location: [facility];  Service:  Gynecology;  Laterality: N/A;  . LAPAROSCOPIC BILATERAL SALPINGECTOMY Bilateral 05/26/2017   Procedure: LAPAROSCOPIC BILATERAL SALPINGECTOMY;  Surgeon: [name], MD;  Location: [facility];  Service: Gynecology;  Laterality: Bilateral;    reports that she has never smoked. She has never used smokeless tobacco. She reports that she does not drink alcohol or use drugs. family history includes Diabetes in her father; Hyperlipidemia in her father and mother; Ulcerative colitis in her cousin; Uterine cancer in her paternal aunt. Allergies  Allergen Reactions  . Tylenol [Acetaminophen] Rash      Outpatient Encounter Medications as of 03/18/2018  Medication Sig  . AMBULATORY NON FORMULARY MEDICATION Medication Name: Nitroglycerin ointment 0.125% Use pea sized amount rectally twice a day  . atorvastatin (LIPITOR) 20 MG tablet Take 1 tablet (20 mg total) by mouth daily.  . Cholecalciferol (VITAMIN D3) 2000 units TABS Take 2,000 Units by mouth daily.  Marland Kitchen dicyclomine (BENTYL) 20 MG tablet Take 1 tablet (20 mg total) by mouth 4 (four) times daily -  before meals and at bedtime.  . Multiple Vitamin (MULTIVITAMIN WITH MINERALS) TABS tablet Take 1 tablet by mouth daily.  Marland Kitchen omega-3 acid ethyl esters (LOVAZA) 1 g capsule Take by mouth 2 (two) times daily.   Facility-Administered Encounter Medications as of 03/18/2018  Medication  . 0.9 %  sodium chloride infusion  REVIEW OF SYSTEMS  : All other systems reviewed and negative except where noted in the History of Present Illness.   PHYSICAL EXAM: BP 100/60   Pulse 80   Ht 5\' 1"  (1.549 m)   Wt 152 lb 12.8 oz (69.3 kg)   LMP 05/01/2017   BMI 28.87 kg/m  General: Well developed female in no acute distress Head: Normocephalic and atraumatic Eyes:  Sclerae anicteric, conjunctiva pink. Ears: Normal auditory acuity Lungs: Clear throughout to auscultation; no increased WOB. Heart: Regular rate and rhythm; no M/R.G. Abdomen: Soft, non-distended.   BS present.  Left sided TTP. Musculoskeletal: Symmetrical with no gross deformities  Skin: No lesions on visible extremities Extremities: No edema  Neurological: Alert oriented x 4, grossly non-focal Psychological:  Alert and cooperative. Normal mood and affect  ASSESSMENT AND PLAN: *LUQ/left sided abdominal pain: Chronic, the same pain since 03/2017.  She has had extensive evaluation including CT scan, colonoscopy, EGD, GYN evaluation with eventual hysterectomy and BSO.  Also has fibromyalgia and anxiety/depression.  Pain is likely functional.  I am going to place her on buspirone 7.5 mg BID and can be increased if tolerated. *Rectal pressure/fullness:  ? From internal hemorrhoids.  Will try hydrocortisone suppositories at bedtime x 10 days.  **Will follow-up in 6-8 weeks with Dr. Silverio Decamp as I am not sure where to go next with her if has not improve with buspirone.  ? TCA.     CC:  Gildardo Pounds, NP

## 2018-03-18 NOTE — Patient Instructions (Signed)
We have sent the following medications to your pharmacy for you to pick up at your convenience:  Hydrocortisone suppositories at bedtime   Buspirone 7.5 mg twice a day

## 2018-03-22 NOTE — Progress Notes (Signed)
Reviewed and agree with documentation and assessment and plan. K. Veena Raeghan Demeter , MD   

## 2018-04-20 ENCOUNTER — Ambulatory Visit: Payer: Self-pay | Admitting: Gastroenterology

## 2018-05-17 ENCOUNTER — Encounter: Payer: Self-pay | Admitting: Obstetrics & Gynecology

## 2018-05-19 ENCOUNTER — Ambulatory Visit: Payer: Self-pay

## 2018-05-20 ENCOUNTER — Encounter: Payer: Self-pay | Admitting: Obstetrics & Gynecology

## 2018-05-20 ENCOUNTER — Ambulatory Visit (INDEPENDENT_AMBULATORY_CARE_PROVIDER_SITE_OTHER): Payer: Self-pay | Admitting: Obstetrics & Gynecology

## 2018-05-20 VITALS — BP 126/82 | Ht 63.0 in | Wt 152.0 lb

## 2018-05-20 DIAGNOSIS — Z90710 Acquired absence of both cervix and uterus: Secondary | ICD-10-CM

## 2018-05-20 DIAGNOSIS — Z01419 Encounter for gynecological examination (general) (routine) without abnormal findings: Secondary | ICD-10-CM

## 2018-05-20 DIAGNOSIS — N898 Other specified noninflammatory disorders of vagina: Secondary | ICD-10-CM

## 2018-05-20 LAB — WET PREP FOR TRICH, YEAST, CLUE

## 2018-05-20 NOTE — Addendum Note (Signed)
Addended by: Thurnell Garbe A on: 05/20/2018 03:52 PM   Modules accepted: Orders

## 2018-05-20 NOTE — Patient Instructions (Signed)
1. Well female exam with routine gynecological exam Gynecologic exam status post LAVH with bilateral salpingectomy.  Pap reflex done on the vaginal vault.  Breast exam normal.  Last screening mammogram was negative in December 2018.  Health labs with family physician.  Followed by gastroenterology for probable IBS.  Body mass index at 26.93.  Started exercising regularly recently and has a healthy nutrition.  2. S/P laparoscopic assisted vaginal hysterectomy (LAVH)  3. Vaginal irritation Normal vagina and negative wet prep.  Reassured. - WET PREP FOR [name], YEAST, CLUE  Aimee Figueroa, it was a pleasure seeing you today!  I will inform you of your results as soon as they are available.

## 2018-05-20 NOTE — Progress Notes (Signed)
Aimee Figueroa Aimee Figueroa [birth year] [id number]   History:    47 y.o. G7P5A2L5 Married  RP:  Established patient presenting for annual gyn exam   HPI: LAVH/Bilateral Salpingectomy/Lysis of Adhesions 04/2017.  Doing much better than last year.  Followed by gastroenterology for probable IBS.  No pelvic pain.  No pain with intercourse.  Complains of mild vaginal irritation.  Urine and bowel movements normal.  Breasts normal.  Body mass index 26.93.  Started being physically active recently.  Healthy nutrition.  Health labs with family physician.  Past medical history,surgical history, family history and social history were all reviewed and documented in the EPIC chart.  Gynecologic History Patient's last menstrual period was 05/01/2017. Contraception: status post hysterectomy Last Pap: 04/2016. Results were: Negative Last mammogram: 08/2017. Results were: Negative Bone Density: Never Colonoscopy: Never  Obstetric History OB History  Gravida Para Term Preterm AB Living  7 5 5  0 2 5  SAB TAB Ectopic Multiple Live Births  2       5    # Outcome Date GA Lbr Len/2nd Weight Sex Delivery Anes PTL Lv  7 Term 03/11/12 [redacted]w[redacted]d 12:51 / 00:15 8 lb 7.1 oz (3.83 kg) M VBAC EPI  LIV     Birth Comments: Weakness in right arm  6 Term 10/01/06    F CS-LTranv EPI N LIV     Birth Comments: breech  5 Term 01/02/03    M Vag-Spont None N LIV  4 Term 09/16/00    M Vag-Spont None N LIV  3 Term 09/16/96    F Vag-Spont EPI N LIV  2 SAB           1 SAB              ROS: A ROS was performed and pertinent positives and negatives are included in the history.  GENERAL: No fevers or chills. HEENT: No change in vision, no earache, sore throat or sinus congestion. NECK: No pain or stiffness. CARDIOVASCULAR: No chest pain or pressure. No palpitations. PULMONARY: No shortness of breath, cough or wheeze. GASTROINTESTINAL: No abdominal pain, nausea, vomiting or diarrhea, melena or bright red blood per rectum.  GENITOURINARY: No urinary frequency, urgency, hesitancy or dysuria. MUSCULOSKELETAL: No joint or muscle pain, no back pain, no recent trauma. DERMATOLOGIC: No rash, no itching, no lesions. ENDOCRINE: No polyuria, polydipsia, no heat or cold intolerance. No recent change in weight. HEMATOLOGICAL: No anemia or easy bruising or bleeding. NEUROLOGIC: No headache, seizures, numbness, tingling or weakness. PSYCHIATRIC: No depression, no loss of interest in normal activity or change in sleep pattern.     Exam:   BP 126/82   LMP 05/01/2017   There is no height or weight on file to calculate BMI.  General appearance : Well developed well nourished female. No acute distress HEENT: Eyes: no retinal hemorrhage or exudates,  Neck supple, trachea midline, no carotid bruits, no thyroidmegaly Lungs: Clear to auscultation, no rhonchi or wheezes, or rib retractions  Heart: Regular rate and rhythm, no murmurs or gallops Breast:Examined in sitting and supine position were symmetrical in appearance, no palpable masses or tenderness,  no skin retraction, no nipple inversion, no nipple discharge, no skin discoloration, no axillary or supraclavicular lymphadenopathy Abdomen: no palpable masses or tenderness, no rebound or guarding Extremities: no edema or skin discoloration or tenderness  Pelvic: Vulva: Normal             Vagina: No gross lesions or discharge.  Wet prep  done.  Pap reflex done.  Cervix/Uterus absent  Adnexa  Without masses or tenderness  Anus: Normal   Assessment/Plan:  47 y.o. female for annual exam   1. Well female exam with routine gynecological exam Gynecologic exam status post LAVH with bilateral salpingectomy.  Pap reflex done on the vaginal vault.  Breast exam normal.  Last screening mammogram was negative in December 2018.  Health labs with family physician.  Followed by gastroenterology for probable IBS.  Body mass index at 26.93.  Started exercising regularly recently and has a healthy  nutrition.  2. S/P laparoscopic assisted vaginal hysterectomy (LAVH)  3. Vaginal irritation Normal vagina and negative wet prep.  Reassured. - WET PREP FOR [place], YEAST, CLUE  [name] MD, 10:18 AM 05/20/2018

## 2018-05-21 ENCOUNTER — Ambulatory Visit: Payer: Self-pay | Admitting: Gastroenterology

## 2018-05-21 ENCOUNTER — Encounter: Payer: Self-pay | Admitting: Gastroenterology

## 2018-05-21 VITALS — BP 100/60 | HR 76 | Ht 61.0 in | Wt 152.8 lb

## 2018-05-21 DIAGNOSIS — K602 Anal fissure, unspecified: Secondary | ICD-10-CM

## 2018-05-21 DIAGNOSIS — K6289 Other specified diseases of anus and rectum: Secondary | ICD-10-CM

## 2018-05-21 DIAGNOSIS — R12 Heartburn: Secondary | ICD-10-CM

## 2018-05-21 DIAGNOSIS — R109 Unspecified abdominal pain: Secondary | ICD-10-CM

## 2018-05-21 DIAGNOSIS — K224 Dyskinesia of esophagus: Secondary | ICD-10-CM

## 2018-05-21 LAB — PAP IG W/ RFLX HPV ASCU

## 2018-05-21 MED ORDER — GI COCKTAIL ~~LOC~~: 10.0000 mL | Freq: Four times a day (QID) | ORAL | 1 refills | Status: DC | PRN

## 2018-05-21 MED ORDER — OMEPRAZOLE 20 MG PO CPDR: 20.0000 mg | DELAYED_RELEASE_CAPSULE | Freq: Every day | ORAL | 3 refills | Status: DC

## 2018-05-21 MED ORDER — AMBULATORY NON FORMULARY MEDICATION: 1 refills | Status: DC

## 2018-05-21 NOTE — Progress Notes (Signed)
[address] [name]    [id number]    December 01, 1970  Primary Care Physician:Fleming, Vernia Buff, NP  Referring Physician: Gildardo Pounds, NP [place], [place] [address]  Chief complaint: Rectal pain, GERD, left-sided abdominal pain  HPI:  47 year old female here for follow-up visit.  Last seen in office March 18, 2018 by Alonza Bogus.  She has chronic left side abdominal pain from left arm shoulder to all the way in her foot.  She has had extensive GI evaluation including CT abdomen pelvis, EGD and colonoscopy negative for any significant GI pathology.  She also has chronic GERD with heartburn and occasional atypical chest pain and epigastric abdominal pain.  She is currently not on any acid suppression and is getting more frequent episodes of heartburn worse when she drinks coffee or eats heavy meals or spicy food.  She has been having intermittent rectal burning sensation, fullness and also sharp pain.  She was treated for anal fissure last year in October.  She has not been using rectal nitroglycerin for past 5 months and feels the rectal pain has been progressively getting worse. She has history of H. pylori gastritis status post eradication. She takes as needed dicyclomine with improvement of abdominal cramping and discomfort.  Outpatient Encounter Medications as of 05/21/2018  Medication Sig  . AMBULATORY NON FORMULARY MEDICATION Medication Name: Nitroglycerin ointment 0.125% Use pea sized amount rectally twice a day  . atorvastatin (LIPITOR) 20 MG tablet Take 1 tablet (20 mg total) by mouth daily.  . busPIRone (BUSPAR) 7.5 MG tablet Take 1 tablet (7.5 mg total) by mouth 2 (two) times daily.  . Cholecalciferol (VITAMIN D3) 2000 units TABS Take 2,000 Units by mouth daily.  Marland Kitchen dicyclomine (BENTYL) 20 MG tablet Take 1 tablet (20 mg total) by mouth 4 (four) times daily -  before meals and at bedtime.  . hydrocortisone (ANUSOL-HC) 25 MG suppository Place 1  suppository (25 mg total) rectally 2 (two) times daily.  . Multiple Vitamin (MULTIVITAMIN WITH MINERALS) TABS tablet Take 1 tablet by mouth daily.  Marland Kitchen omega-3 acid ethyl esters (LOVAZA) 1 g capsule Take by mouth 2 (two) times daily.   Facility-Administered Encounter Medications as of 05/21/2018  Medication  . 0.9 %  sodium chloride infusion    Allergies as of 05/21/2018 - Review Complete 05/21/2018  Allergen Reaction Noted  . Tylenol [acetaminophen] Rash 09/17/2011    Past Medical History:  Diagnosis Date  . Abnormal Pap smear   . Allergy    seasonal  . Anxiety   . Depression   . Fibromyalgia   . Hyperlipemia   . Low blood pressure   . Pyloric ulcer associated with Helicobacter pylori, acute 05/22/2017   patient not sure if she has an ulcer - first dx  . SVD (spontaneous vaginal delivery)    x 4  . Tuberculosis    47 yrs old    Past Surgical History:  Procedure Laterality Date  . CESAREAN SECTION    . COLONOSCOPY    . GYNECOLOGIC CRYOSURGERY    . LAPAROSCOPIC ASSISTED VAGINAL HYSTERECTOMY N/A 05/26/2017   Procedure: LAPAROSCOPIC ASSISTED VAGINAL HYSTERECTOMY;  Surgeon: [name], MD;  Location: [facility];  Service: Gynecology;  Laterality: N/A;  . LAPAROSCOPIC BILATERAL SALPINGECTOMY Bilateral 05/26/2017   Procedure: LAPAROSCOPIC BILATERAL SALPINGECTOMY;  Surgeon: [name], MD;  Location: [facility];  Service: Gynecology;  Laterality: Bilateral;  . UPPER GI ENDOSCOPY  Family History  Problem Relation Age of Onset  . Diabetes Father   . Hyperlipidemia Father   . Hyperlipidemia Mother   . Uterine cancer Paternal Aunt   . Ulcerative colitis Cousin   . Anesthesia problems Neg Hx   . Colon cancer Neg Hx   . Rectal cancer Neg Hx   . Esophageal cancer Neg Hx   . Liver cancer Neg Hx     Social History   Socioeconomic History  . Marital status: Married    Spouse name: Not on file  . Number of children: 5  . Years of education: Not on file  . Highest  education level: Not on file  Occupational History  . Occupation: homemaker  Social Needs  . Financial resource strain: Not on file  . Food insecurity:    Worry: Not on file    Inability: Not on file  . Transportation needs:    Medical: Not on file    Non-medical: Not on file  Tobacco Use  . Smoking status: Never Smoker  . Smokeless tobacco: Never Used  Substance and Sexual Activity  . Alcohol use: No  . Drug use: No  . Sexual activity: Yes    Partners: Male    Birth control/protection: Injection  Lifestyle  . Physical activity:    Days per week: Not on file    Minutes per session: Not on file  . Stress: Not on file  Relationships  . Social connections:    Talks on phone: Not on file    Gets together: Not on file    Attends religious service: Not on file    Active member of club or organization: Not on file    Attends meetings of clubs or organizations: Not on file    Relationship status: Not on file  . Intimate partner violence:    Fear of current or ex partner: Not on file    Emotionally abused: Not on file    Physically abused: Not on file    Forced sexual activity: Not on file  Other Topics Concern  . Not on file  Social History Narrative  . Not on file      Review of systems: Review of Systems  Constitutional: Negative for fever and chills.  HENT: Negative.   Eyes: Negative for blurred vision.  Respiratory: Negative for cough, shortness of breath and wheezing.   Cardiovascular: Negative for chest pain and palpitations.  Gastrointestinal: as per HPI Genitourinary: Negative for dysuria, urgency, frequency and hematuria.  Musculoskeletal: Negative for myalgias, back pain and joint pain.  Skin: Negative for itching and rash.  Neurological: Negative for dizziness, tremors, focal weakness, seizures and loss of consciousness.  Endo/Heme/Allergies: Negative for seasonal allergies.  Psychiatric/Behavioral: Negative for depression, suicidal ideas and  hallucinations.  All other systems reviewed and are negative.   Physical Exam: Vitals:   05/21/18 0942  BP: 100/60  Pulse: 76   Body mass index is 28.87 kg/m. Gen:      No acute distress HEENT:  EOMI, sclera anicteric Neck:     No masses; no thyromegaly Lungs:    Clear to auscultation bilaterally; normal respiratory effort CV:         Regular rate and rhythm; no murmurs Abd:      + bowel sounds; soft, non-tender; no palpable masses, no distension Ext:    No edema; adequate peripheral perfusion Skin:      Warm and dry; no rash Neuro: alert and oriented x 3 Psych: normal  mood and affect  Data Reviewed:  Reviewed labs, radiology imaging, old records and pertinent past GI work up   Assessment and Plan/Recommendations:  47 year old female with chronic GERD, left-sided abdominal pain and rectal discomfort associated with burning sensation and bright red blood per rectum On rectal exam patient has an anterior anal fissure and grade 2 internal hemorrhoids. Advised patient to apply pea-sized amount of rectal nitroglycerin 0.125% 3 times daily for 10 to 12 weeks Benefiber 1 teaspoon 3 times daily with meals Increase dietary fiber and fluid intake  Heartburn, esophageal spasms and atypical chest pain Start omeprazole 20 mg daily, 30 minutes before breakfast Discussed antireflux measures and lifestyle modification GI cocktail 10 cc every 6 hours as needed for symptom relief when she has severe episodes of esophageal spasm  Advised patient to follow-up with PMD to exclude cardiac disease given her symptoms of worsening shortness of breath and atypical chest pain  Left side abdominal pain likely musculoskeletal in nature work-up so far negative for any significant pathology.  Greater than 50% of the time used for counseling as well as treatment plan and follow-up. She had multiple questions which were answered to her satisfaction  K. Denzil Magnuson , MD [phone]    CC:  Gildardo Pounds, NP

## 2018-05-21 NOTE — Patient Instructions (Addendum)
Use Rectal nitroglycerin three times a day for 2-3 months  Use Benefiber 1 teaspoon three times a day as needed  We have given you a printed prescription of GI Cocktail to take to [facility] dicyclomine   We will send omeprazole 20 mg to your pharmacy  Follow up in 3 months  If you are age 46 or older, your body mass index should be between 23-30. Your Body mass index is 28.87 kg/m. If this is out of the aforementioned range listed, please consider follow up with your Primary Care Provider.  If you are age 44 or younger, your body mass index should be between 19-25. Your Body mass index is 28.87 kg/m. If this is out of the aformentioned range listed, please consider follow up with your Primary Care Provider.    Thank you for choosing [facility]  Karleen Hampshire Nandigam,MD

## 2018-05-26 ENCOUNTER — Ambulatory Visit: Payer: Self-pay

## 2018-06-03 ENCOUNTER — Ambulatory Visit: Payer: Self-pay | Attending: Family Medicine | Admitting: Physician Assistant

## 2018-06-03 VITALS — BP 108/65 | HR 73 | Temp 98.2°F | Resp 18 | Ht 61.0 in | Wt 152.8 lb

## 2018-06-03 DIAGNOSIS — R5383 Other fatigue: Secondary | ICD-10-CM | POA: Insufficient documentation

## 2018-06-03 DIAGNOSIS — E781 Pure hyperglyceridemia: Secondary | ICD-10-CM

## 2018-06-03 DIAGNOSIS — H6980 Other specified disorders of Eustachian tube, unspecified ear: Secondary | ICD-10-CM | POA: Insufficient documentation

## 2018-06-03 DIAGNOSIS — H6982 Other specified disorders of Eustachian tube, left ear: Secondary | ICD-10-CM

## 2018-06-03 DIAGNOSIS — Z79899 Other long term (current) drug therapy: Secondary | ICD-10-CM | POA: Insufficient documentation

## 2018-06-03 DIAGNOSIS — G44209 Tension-type headache, unspecified, not intractable: Secondary | ICD-10-CM | POA: Insufficient documentation

## 2018-06-03 DIAGNOSIS — E782 Mixed hyperlipidemia: Secondary | ICD-10-CM | POA: Insufficient documentation

## 2018-06-03 MED ORDER — METHOCARBAMOL 500 MG PO TABS: 500.0000 mg | ORAL_TABLET | Freq: Three times a day (TID) | ORAL | 0 refills | Status: DC

## 2018-06-03 MED ORDER — FLUTICASONE PROPIONATE 50 MCG/ACT NA SUSP: 2.0000 | Freq: Every day | NASAL | 6 refills | Status: DC

## 2018-06-03 MED ORDER — ATORVASTATIN CALCIUM 20 MG PO TABS: 20.0000 mg | ORAL_TABLET | Freq: Every day | ORAL | 3 refills | Status: DC

## 2018-06-03 MED ORDER — IBUPROFEN 800 MG PO TABS: 800.0000 mg | ORAL_TABLET | Freq: Three times a day (TID) | ORAL | 1 refills | Status: DC

## 2018-06-03 MED ORDER — KETOROLAC TROMETHAMINE 60 MG/2ML IM SOLN
60.0000 mg | Freq: Once | INTRAMUSCULAR | Status: AC
  Administered 2018-06-03: 60 mg via INTRAMUSCULAR

## 2018-06-03 MED FILL — METHOCARBAMOL 500 MG TABS: 500 | 20 days supply | Qty: 60 | Fill #0

## 2018-06-03 MED FILL — ATORVASTATIN 20 MG TABLET: 20 | 30 days supply | Qty: 30 | Fill #0

## 2018-06-03 NOTE — Progress Notes (Signed)
Patient ID: Aimee Figueroa, female   DOB: [birth year], 47 y.o.   MRN: [id number]                Aimee Figueroa, is a 47 y.o. female  WUJ:[id number]  [id number]  DOB - 1971/04/27  Subjective:  Chief Complaint and HPI: Aimee Figueroa is a 47 y.o. female here for HAs for 3 weeks in back of head and neck and around to front and top of head.  No vision changes but eyes "feel tired."  Mild photophobia.  HA lasts all day-OTC meds provide slight relief.  No neurological deficits.  She has been sleeping on new pillows.  Travelled to [place] in July.  No N/V/D.  No tick bites.  No fever/chills.   Also c/o generalized fatigue and just not feeling well or energetic for the last year or so.    No FH aneurysm/stroke.  Needs RF cholesterol medications  Social History: married, has 5 children-3 that live at home-ages 15, 11, and 6.(older 2 grown)  ROS:   Constitutional:  No f/c, No night sweats, No unexplained weight loss. EENT:  No vision changes, No blurry vision, No hearing changes. No mouth, throat problems. + L ear pain yesterday Respiratory: No cough, No SOB Cardiac: No CP, no palpitations GI:  No abd pain, No N/V/D. GU: No Urinary s/sx Musculoskeletal: No joint pain Neuro: + headache, no dizziness, no motor weakness.  Skin: No rash Endocrine:  No polydipsia. No polyuria.  Psych: Denies SI/HI  No problems updated.  ALLERGIES: Allergies  Allergen Reactions  . Tylenol [Acetaminophen] Rash    PAST MEDICAL HISTORY: Past Medical History:  Diagnosis Date  . Abnormal Pap smear   . Allergy    seasonal  . Anxiety   . Depression   . Fibromyalgia   . Hyperlipemia   . Low blood pressure   . Pyloric ulcer associated with Helicobacter pylori, acute 05/22/2017   patient not sure if she has an ulcer - first dx  . SVD (spontaneous vaginal delivery)    x 4  . Tuberculosis    47 yrs old    MEDICATIONS AT HOME: Prior to Admission  medications   Medication Sig Start Date End Date Taking? Authorizing Provider  Alum & Mag Hydroxide-Simeth (GI COCKTAIL) SUSP suspension Take 10 mLs by mouth every 6 (six) hours as needed for indigestion. Shake well. 05/21/18  Yes Nandigam, Venia Minks, MD  AMBULATORY NON FORMULARY MEDICATION Medication Name: Nitroglycerin ointment 0.125% Use pea sized amount rectally twice a day 05/21/18  Yes Nandigam, Venia Minks, MD  atorvastatin (LIPITOR) 20 MG tablet Take 1 tablet (20 mg total) by mouth daily. 06/03/18  Yes McClung, Dionne Bucy, PA-C  busPIRone (BUSPAR) 7.5 MG tablet Take 1 tablet (7.5 mg total) by mouth 2 (two) times daily. 03/18/18  Yes Zehr, Laban Emperor, PA-C  Cholecalciferol (VITAMIN D3) 2000 units TABS Take 2,000 Units by mouth daily.   Yes [provider]  hydrocortisone (ANUSOL-HC) 25 MG suppository Place 1 suppository (25 mg total) rectally 2 (two) times daily. 03/18/18  Yes Zehr, Laban Emperor, PA-C  Multiple Vitamin (MULTIVITAMIN WITH MINERALS) TABS tablet Take 1 tablet by mouth daily.   Yes [provider]  omega-3 acid ethyl esters (LOVAZA) 1 g capsule Take by mouth 2 (two) times daily.   Yes [provider]  omeprazole (PRILOSEC) 20 MG capsule Take 1 capsule (20 mg total) by mouth daily. 30 minutes before breakfast 05/21/18  Yes Nandigam, Venia Minks, MD  dicyclomine (BENTYL) 20 MG tablet Take 1 tablet (20 mg total) by mouth 4 (four) times daily -  before meals and at bedtime. Patient not taking: Reported on 06/03/2018 02/17/18   Levin Erp, PA  ibuprofen (ADVIL,MOTRIN) 800 MG tablet Take 1 tablet (800 mg total) by mouth 3 (three) times daily. X 5 days then prn headache/pain 06/03/18   Argentina Donovan, PA-C  methocarbamol (ROBAXIN) 500 MG tablet Take 1 tablet (500 mg total) by mouth 3 (three) times daily. X 5 days then prn muscle spasms 06/03/18   Argentina Donovan, PA-C     Objective:  EXAM:   Vitals:   06/03/18 1054  BP: 108/65  Pulse: 73  Resp: 18  Temp:  98.2 F (36.8 C)  TempSrc: Oral  SpO2: 98%  Weight: 152 lb 12.8 oz (69.3 kg)  Height: 5\' 1"  (1.549 m)    General appearance : A&OX3. NAD. Non-toxic-appearing HEENT: Atraumatic and Normocephalic.  PERRLA. EOM intact.  L TM full without infection, R TM WNL. Mouth-MMM, post pharynx WNL w/o erythema, + PND. Neck: supple, no JVD. No cervical lymphadenopathy. No thyromegaly Chest/Lungs:  Breathing-non-labored, Good air entry bilaterally, breath sounds normal without rales, rhonchi, or wheezing  CVS: S1 S2 regular, no murmurs, gallops, rubs  Extremities: Bilateral Lower Ext shows no edema, both legs are warm to touch with = pulse throughout Neurology:  CN II-XII grossly intact, Non focal.   Psych:  TP linear. J/I WNL. Normal speech. Appropriate eye contact and affect.  Skin:  No Rash  Data Review Lab Results  Component Value Date   HGBA1C 5.8 (H) 05/31/2015   HGBA1C 5.2 02/01/2014     Assessment & Plan   1. Acute non intractable tension-type headache No red flags - ketorolac (TORADOL) injection 60 mg - TSH - Basic metabolic panel - ibuprofen (ADVIL,MOTRIN) 800 MG tablet; Take 1 tablet (800 mg total) by mouth 3 (three) times daily. X 5 days then prn headache/pain  Dispense: 30 tablet; Refill: 1 - methocarbamol (ROBAXIN) 500 MG tablet; Take 1 tablet (500 mg total) by mouth 3 (three) times daily. X 5 days then prn muscle spasms  Dispense: 60 tablet; Refill: 0 -to ED if HA worsens/becomes severe  2. Mixed hyperlipidemia - atorvastatin (LIPITOR) 20 MG tablet; Take 1 tablet (20 mg total) by mouth daily.  Dispense: 30 tablet; Refill: 3  3. High triglycerides - atorvastatin (LIPITOR) 20 MG tablet; Take 1 tablet (20 mg total) by mouth daily.  Dispense: 30 tablet; Refill: 3  4. Fatigue, unspecified type Self-care, increase water intake discussed.   - TSH - Vitamin D, 25-hydroxy - CBC with Differential/Platelet  5. Eustachian tube dysfunction-flonase NS-2 sprays/nostril daily.     Patient have been counseled extensively about nutrition and exercise  Return in about 1 week (around 06/10/2018) for f/up for HA with me/angela.  The patient was given clear instructions to go to ER or return to medical center if symptoms don't improve, worsen or new problems develop. The patient verbalized understanding. The patient was told to call to get lab results if they haven't heard anything in the next week.     Freeman Caldron, PA-C [facility] and [facility] [place], [place]   06/03/2018, 11:10 AM

## 2018-06-03 NOTE — Patient Instructions (Addendum)
Make sure and drink about 80 ounces of water daily.    Tension Headache A tension headache is pain, pressure, or aching that is felt over the front and sides of your head. These headaches can last from 30 minutes to several days. Follow these instructions at home: Managing pain  Take over-the-counter and prescription medicines only as told by your doctor.  Lie down in a dark, quiet room when you have a headache.  If directed, apply ice to your head and neck area: ? Put ice in a plastic bag. ? Place a towel between your skin and the bag. ? Leave the ice on for 20 minutes, 2-3 times per day.  Use a heating pad or a hot shower to apply heat to your head and neck area as told by your doctor. Eating and drinking  Eat meals on a regular schedule.  Do not drink a lot of alcohol.  Do not use a lot of caffeine, or stop using caffeine. General instructions  Keep all follow-up visits as told by your doctor. This is important.  Keep a journal to find out if certain things bring on headaches. For example, write down: ? What you eat and drink. ? How much sleep you get. ? Any change to your diet or medicines.  Try getting a massage, or doing other things that help you to relax.  Lessen stress.  Sit up straight. Do not tighten (tense) your muscles.  Do not use tobacco products. This includes cigarettes, chewing tobacco, or e-cigarettes. If you need help quitting, ask your doctor.  Exercise regularly as told by your doctor.  Get enough sleep. This may mean 7-9 hours of sleep. Contact a doctor if:  Your symptoms are not helped by medicine.  You have a headache that feels different from your usual headache.  You feel sick to your stomach (nauseous) or you throw up (vomit).  You have a fever. Get help right away if:  Your headache becomes very bad.  You keep throwing up.  You have a stiff neck.  You have trouble seeing.  You have trouble speaking.  You have pain in your  eye or ear.  Your muscles are weak or you lose muscle control.  You lose your balance or you have trouble walking.  You feel like you will pass out (faint) or you pass out.  You have confusion. This information is not intended to replace advice given to you by your health care provider. Make sure you discuss any questions you have with your health care provider. Document Released: 12/10/2009 Document Revised: 05/15/2016 Document Reviewed: 01/08/2015 Elsevier Interactive Patient Education  2018 [employer].   Eustachian Tube Dysfunction The eustachian tube connects the middle ear to the back of the nose. It regulates air pressure in the middle ear by allowing air to move between the ear and nose. It also helps to drain fluid from the middle ear space. When the eustachian tube does not function properly, air pressure, fluid, or both can build up in the middle ear. Eustachian tube dysfunction can affect one or both ears. What are the causes? This condition happens when the eustachian tube becomes blocked or cannot open normally. This may result from:  Ear infections.  Colds and other upper respiratory infections.  Allergies.  Irritation, such as from cigarette smoke or acid from the stomach coming up into the esophagus (gastroesophageal reflux).  Sudden changes in air pressure, such as from descending in an airplane.  Abnormal growths in  the nose or throat, such as nasal polyps, tumors, or enlarged tissue at the back of the throat (adenoids).  What increases the risk? This condition may be more likely to develop in people who smoke and people who are overweight. Eustachian tube dysfunction may also be more likely to develop in children, especially children who have:  Certain birth defects of the mouth, such as cleft palate.  Large tonsils and adenoids.  What are the signs or symptoms? Symptoms of this condition may include:  A feeling of fullness in the ear.  Ear  pain.  Clicking or popping noises in the ear.  Ringing in the ear.  Hearing loss.  Loss of balance.  Symptoms may get worse when the air pressure around you changes, such as when you travel to an area of high elevation or fly on an airplane. How is this diagnosed? This condition may be diagnosed based on:  Your symptoms.  A physical exam of your ear, nose, and throat.  Tests, such as those that measure: ? The movement of your eardrum (tympanogram). ? Your hearing (audiometry).  How is this treated? Treatment depends on the cause and severity of your condition. If your symptoms are mild, you may be able to relieve your symptoms by moving air into ("popping") your ears. If you have symptoms of fluid in your ears, treatment may include:  Decongestants.  Antihistamines.  Nasal sprays or ear drops that contain medicines that reduce swelling (steroids).  In some cases, you may need to have a procedure to drain the fluid in your eardrum (myringotomy). In this procedure, a small tube is placed in the eardrum to:  Drain the fluid.  Restore the air in the middle ear space.  Follow these instructions at home:  Take over-the-counter and prescription medicines only as told by your health care provider.  Use techniques to help pop your ears as recommended by your health care provider. These may include: ? Chewing gum. ? Yawning. ? Frequent, forceful swallowing. ? Closing your mouth, holding your nose closed, and gently blowing as if you are trying to blow air out of your nose.  Do not do any of the following until your health care provider approves: ? Travel to high altitudes. ? Fly in airplanes. ? Work in a Pension scheme manager or room. ? Scuba dive.  Keep your ears dry. Dry your ears completely after showering or bathing.  Do not smoke.  Keep all follow-up visits as told by your health care provider. This is important. Contact a health care provider if:  Your symptoms do  not go away after treatment.  Your symptoms come back after treatment.  You are unable to pop your ears.  You have: ? A fever. ? Pain in your ear. ? Pain in your head or neck. ? Fluid draining from your ear.  Your hearing suddenly changes.  You become very dizzy.  You lose your balance. This information is not intended to replace advice given to you by your health care provider. Make sure you discuss any questions you have with your health care provider. Document Released: 10/12/2015 Document Revised: 02/21/2016 Document Reviewed: 10/04/2014 Elsevier Interactive Patient Education  [employer].

## 2018-06-04 ENCOUNTER — Ambulatory Visit: Payer: Self-pay | Attending: Family Medicine

## 2018-06-04 LAB — CBC WITH DIFFERENTIAL/PLATELET
BASOS ABS: 0 10*3/uL (ref 0.0–0.2)
BASOS: 1 %
EOS (ABSOLUTE): 0.1 10*3/uL (ref 0.0–0.4)
Eos: 3 %
Hematocrit: 41.8 % (ref 34.0–46.6)
Hemoglobin: 14.6 g/dL (ref 11.1–15.9)
IMMATURE GRANS (ABS): 0 10*3/uL (ref 0.0–0.1)
Immature Granulocytes: 0 %
LYMPHS ABS: 1.9 10*3/uL (ref 0.7–3.1)
Lymphs: 52 %
MCH: 32.2 pg (ref 26.6–33.0)
MCHC: 34.9 g/dL (ref 31.5–35.7)
MCV: 92 fL (ref 79–97)
MONOS ABS: 0.2 10*3/uL (ref 0.1–0.9)
Monocytes: 6 %
NEUTROS ABS: 1.4 10*3/uL (ref 1.4–7.0)
Neutrophils: 38 %
PLATELETS: 159 10*3/uL (ref 150–450)
RBC: 4.54 x10E6/uL (ref 3.77–5.28)
RDW: 12.8 % (ref 12.3–15.4)
WBC: 3.6 10*3/uL (ref 3.4–10.8)

## 2018-06-04 LAB — BASIC METABOLIC PANEL
BUN/Creatinine Ratio: 23 (ref 9–23)
BUN: 12 mg/dL (ref 6–24)
CO2: 26 mmol/L (ref 20–29)
CREATININE: 0.53 mg/dL — AB (ref 0.57–1.00)
Calcium: 9.6 mg/dL (ref 8.7–10.2)
Chloride: 100 mmol/L (ref 96–106)
GFR calc Af Amer: 132 mL/min/{1.73_m2} (ref >59)
GFR calc non Af Amer: 114 mL/min/{1.73_m2} (ref >59)
GLUCOSE: 117 mg/dL — AB (ref 65–99)
POTASSIUM: 4 mmol/L (ref 3.5–5.2)
SODIUM: 141 mmol/L (ref 134–144)

## 2018-06-04 LAB — VITAMIN D 25 HYDROXY (VIT D DEFICIENCY, FRACTURES): Vit D, 25-Hydroxy: 35.8 ng/mL (ref 30.0–100.0)

## 2018-06-04 LAB — TSH: TSH: 0.776 u[IU]/mL (ref 0.450–4.500)

## 2018-06-08 ENCOUNTER — Telehealth: Payer: Self-pay | Admitting: *Deleted

## 2018-06-08 NOTE — Telephone Encounter (Signed)
Medical Assistant used [employer] to contact patient.  Interpreter Name: Bethann Goo #: [id number] Patient was not available, [employer] left patient a voicemail. Voicemail states to give a call back to [place] with [facility] at [phone]. Patient is aware of thyroid and vitamin d was normal. Patient blood sugar is a bit high and kidney function is slightly impaired. Encouraged patient to drink more water and cut down on sugar and cards. Patient will follow up as planned.

## 2018-06-08 NOTE — Telephone Encounter (Signed)
-----   Message from Argentina Donovan, [place] sent at 06/04/2018  2:03 PM EDT ----- Please call patient.  Thyroid and vitamin D were normal.  Blood sugar was a little high and kidney function was slightly impaired.  Drink more water and cut down on sugars/white carbohydrates.  Blood count and electrolytes are normal.  Thanks, Freeman Caldron, PA-C

## 2018-06-09 ENCOUNTER — Encounter: Payer: Self-pay | Admitting: Nurse Practitioner

## 2018-06-09 ENCOUNTER — Ambulatory Visit: Payer: Self-pay | Attending: Nurse Practitioner | Admitting: Nurse Practitioner

## 2018-06-09 VITALS — BP 104/68 | HR 69 | Temp 98.7°F | Ht 61.0 in | Wt 154.4 lb

## 2018-06-09 DIAGNOSIS — Z79899 Other long term (current) drug therapy: Secondary | ICD-10-CM | POA: Insufficient documentation

## 2018-06-09 DIAGNOSIS — Z886 Allergy status to analgesic agent status: Secondary | ICD-10-CM | POA: Insufficient documentation

## 2018-06-09 DIAGNOSIS — Z7951 Long term (current) use of inhaled steroids: Secondary | ICD-10-CM | POA: Insufficient documentation

## 2018-06-09 DIAGNOSIS — Z833 Family history of diabetes mellitus: Secondary | ICD-10-CM | POA: Insufficient documentation

## 2018-06-09 DIAGNOSIS — E785 Hyperlipidemia, unspecified: Secondary | ICD-10-CM | POA: Insufficient documentation

## 2018-06-09 DIAGNOSIS — M797 Fibromyalgia: Secondary | ICD-10-CM | POA: Insufficient documentation

## 2018-06-09 DIAGNOSIS — Z8611 Personal history of tuberculosis: Secondary | ICD-10-CM | POA: Insufficient documentation

## 2018-06-09 DIAGNOSIS — Z9079 Acquired absence of other genital organ(s): Secondary | ICD-10-CM | POA: Insufficient documentation

## 2018-06-09 DIAGNOSIS — G8929 Other chronic pain: Secondary | ICD-10-CM

## 2018-06-09 DIAGNOSIS — R5382 Chronic fatigue, unspecified: Secondary | ICD-10-CM | POA: Insufficient documentation

## 2018-06-09 DIAGNOSIS — R51 Headache: Secondary | ICD-10-CM | POA: Insufficient documentation

## 2018-06-09 DIAGNOSIS — Z90710 Acquired absence of both cervix and uterus: Secondary | ICD-10-CM | POA: Insufficient documentation

## 2018-06-09 MED ORDER — NORTRIPTYLINE HCL 25 MG PO CAPS: 25.0000 mg | ORAL_CAPSULE | Freq: Every day | ORAL | 2 refills | Status: DC

## 2018-06-09 MED FILL — NORTRIPTYLINE HCL 25 MG CAP: 25 | 30 days supply | Qty: 30 | Fill #0

## 2018-06-09 NOTE — Progress Notes (Signed)
Assessment & Plan:  [name] was seen today for headache.  Diagnoses and all orders for this visit:  Chronic fatigue -     Cyclic Citrul Peptide Antibody, IGG -     Rheumatoid factor -     Sedimentation Rate -     C-reactive protein -     Anti-Smith antibody -     Anti-DNA antibody, double-stranded -     ANA,IFA RA Diag Pnl w/rflx Tit/Patn -     CYCLIC CITRUL PEPTIDE ANTIBODY, IGG/IGA  Chronic nonintractable headache, unspecified headache type -     nortriptyline (PAMELOR) 25 MG capsule; Take 1 capsule (25 mg total) by mouth at bedtime.    Patient has been counseled on age-appropriate routine health concerns for screening and prevention. These are reviewed and up-to-date. Referrals have been placed accordingly. Immunizations are up-to-date or declined.    Subjective:   Chief Complaint  Patient presents with  . Headache    Pt. stated she still having bad headaches for 4 weeks. Pt. stated her right eye hurt with a small bump yesterday.    HPI Aimee Figueroa - Aimee Figueroa 47 y.o. female presents to office today with persistent complaints of headache and fatigue. She has taken naproxen 500 mg, ibuprofen 800 mg and robaxin 500 mg with little to no relief of pain. She was given an injection of Toradol a few weeks ago and reports moderate relief of headache for a few days after the injection however the headache returned.  States it feels like someone is holding the top of her head. Onset was 4 weeks ago. She endorses light sensitivity as well.    Fatigue: Patient complains of fatigue. Symptoms began over a year ago after she began having rectal complaints. Sentinal symptom the patient feels fatigue began with: none. Symptoms of her fatigue have been anxiousness, fatigue with paradoxical insomnia, general malaise, headaches and lack of interest in usual activities. Patient describes the following psychologic symptoms: none.  Patient denies significant change in weight, unusual  rashes, cold intolerance, constipation and change in hair texture., excessive menstrual bleeding and witnessed or suspected sleep apnea. Symptoms have gradually worsened. Severity has been struggles to carry out day to day responsibilities.. Previous visits for this problem: multiple, this is a longstanding diagnosis. Last seen several days ago by other provider at this office.    Review of Systems  Constitutional: Positive for malaise/fatigue. Negative for fever and weight loss.  HENT: Negative.  Negative for nosebleeds.   Eyes: Negative for blurred vision, double vision, photophobia, pain, discharge and redness.       Light sensitivity with headaches  Respiratory: Negative.  Negative for cough and shortness of breath.   Cardiovascular: Negative.  Negative for chest pain, palpitations and leg swelling.  Gastrointestinal: Negative.  Negative for heartburn, nausea and vomiting.  Musculoskeletal: Negative.  Negative for myalgias.  Neurological: Positive for headaches. Negative for dizziness, focal weakness and seizures.  Psychiatric/Behavioral: Positive for depression. Negative for suicidal ideas. The patient is nervous/anxious.     Past Medical History:  Diagnosis Date  . Abnormal Pap smear   . Allergy    seasonal  . Anxiety   . Depression   . Fibromyalgia   . Hyperlipemia   . Low blood pressure   . Pyloric ulcer associated with Helicobacter pylori, acute 05/22/2017   patient not sure if she has an ulcer - first dx  . SVD (spontaneous vaginal delivery)    x 4  . Tuberculosis  47 yrs old    Past Surgical History:  Procedure Laterality Date  . CESAREAN SECTION    . COLONOSCOPY    . GYNECOLOGIC CRYOSURGERY    . LAPAROSCOPIC ASSISTED VAGINAL HYSTERECTOMY N/A 05/26/2017   Procedure: LAPAROSCOPIC ASSISTED VAGINAL HYSTERECTOMY;  Surgeon: [name], MD;  Location: [facility];  Service: Gynecology;  Laterality: N/A;  . LAPAROSCOPIC BILATERAL SALPINGECTOMY Bilateral 05/26/2017    Procedure: LAPAROSCOPIC BILATERAL SALPINGECTOMY;  Surgeon: [name], MD;  Location: [facility];  Service: Gynecology;  Laterality: Bilateral;  . UPPER GI ENDOSCOPY      Family History  Problem Relation Age of Onset  . Diabetes Father   . Hyperlipidemia Father   . Hyperlipidemia Mother   . Uterine cancer Paternal Aunt   . Ulcerative colitis Cousin   . Anesthesia problems Neg Hx   . Colon cancer Neg Hx   . Rectal cancer Neg Hx   . Esophageal cancer Neg Hx   . Liver cancer Neg Hx     Social History Reviewed with no changes to be made today.   Outpatient Medications Prior to Visit  Medication Sig Dispense Refill  . AMBULATORY NON FORMULARY MEDICATION Medication Name: Nitroglycerin ointment 0.125% Use pea sized amount rectally twice a day 30 g 1  . atorvastatin (LIPITOR) 20 MG tablet Take 1 tablet (20 mg total) by mouth daily. 30 tablet 3  . fluticasone (FLONASE) 50 MCG/ACT nasal spray Place 2 sprays into both nostrils daily. 16 g 6  . hydrocortisone (ANUSOL-HC) 25 MG suppository Place 1 suppository (25 mg total) rectally 2 (two) times daily. 10 suppository 0  . ibuprofen (ADVIL,MOTRIN) 800 MG tablet Take 1 tablet (800 mg total) by mouth 3 (three) times daily. X 5 days then prn headache/pain 30 tablet 1  . methocarbamol (ROBAXIN) 500 MG tablet Take 1 tablet (500 mg total) by mouth 3 (three) times daily. X 5 days then prn muscle spasms 60 tablet 0  . Multiple Vitamin (MULTIVITAMIN WITH MINERALS) TABS tablet Take 1 tablet by mouth daily.    Marland Kitchen omega-3 acid ethyl esters (LOVAZA) 1 g capsule Take by mouth 2 (two) times daily.    . Alum & Mag Hydroxide-Simeth (GI COCKTAIL) SUSP suspension Take 10 mLs by mouth every 6 (six) hours as needed for indigestion. Shake well. (Patient not taking: Reported on 06/09/2018) 900 mL 1  . busPIRone (BUSPAR) 7.5 MG tablet Take 1 tablet (7.5 mg total) by mouth 2 (two) times daily. (Patient not taking: Reported on 06/09/2018) 60 tablet 1  . Cholecalciferol  (VITAMIN D3) 2000 units TABS Take 2,000 Units by mouth daily.    Marland Kitchen dicyclomine (BENTYL) 20 MG tablet Take 1 tablet (20 mg total) by mouth 4 (four) times daily -  before meals and at bedtime. (Patient not taking: Reported on 06/03/2018) 120 tablet 2  . omeprazole (PRILOSEC) 20 MG capsule Take 1 capsule (20 mg total) by mouth daily. 30 minutes before breakfast (Patient not taking: Reported on 06/09/2018) 90 capsule 3   Facility-Administered Medications Prior to Visit  Medication Dose Route Frequency Provider Last Rate Last Dose  . 0.9 %  sodium chloride infusion  500 mL Intravenous Once Nandigam, Venia Minks, MD        Allergies  Allergen Reactions  . Tylenol [Acetaminophen] Rash       Objective:    BP 104/68 (BP Location: Left Arm, Patient Position: Sitting, Cuff Size: Large)   Pulse 69   Temp 98.7 F (37.1 C) (Oral)   Ht 5\' 1"  (  1.549 m)   Wt 154 lb 6.4 oz (70 kg)   LMP 05/01/2017   SpO2 98%   BMI 29.17 kg/m  Wt Readings from Last 3 Encounters:  06/09/18 154 lb 6.4 oz (70 kg)  06/03/18 152 lb 12.8 oz (69.3 kg)  05/21/18 152 lb 12.8 oz (69.3 kg)    Physical Exam  Constitutional: She is oriented to person, place, and time. She appears well-developed and well-nourished. She is cooperative.  HENT:  Head: Normocephalic and atraumatic.  Eyes: EOM are normal.  Neck: Normal range of motion.  Cardiovascular: Normal rate, regular rhythm, normal heart sounds and intact distal pulses. Exam reveals no gallop and no friction rub.  No murmur heard. Pulmonary/Chest: Effort normal and breath sounds normal. No tachypnea. No respiratory distress. She has no decreased breath sounds. She has no wheezes. She has no rhonchi. She has no rales. She exhibits no tenderness.  Abdominal: Soft. Bowel sounds are normal.  Musculoskeletal: Normal range of motion. She exhibits no edema.  Neurological: She is alert and oriented to person, place, and time. She has normal strength. No cranial nerve deficit or  sensory deficit. Coordination and gait normal.  Skin: Skin is warm and dry.  Psychiatric: She has a normal mood and affect. Her speech is normal and behavior is normal. Judgment and thought content normal. Cognition and memory are normal. She expresses no homicidal and no suicidal ideation. She expresses no suicidal plans and no homicidal plans.  Nursing note and vitals reviewed.      Patient has been counseled extensively about nutrition and exercise as well as the importance of adherence with medications and regular follow-up. The patient was given clear instructions to go to ER or return to medical center if symptoms don't improve, worsen or new problems develop. The patient verbalized understanding.   Follow-up: Return in about 3 weeks (around 06/30/2018) for f/u headaches .   Gildardo Pounds, FNP-BC [facility] and [facility] [place], [place]   06/11/2018, 9:28 AM

## 2018-06-09 NOTE — Patient Instructions (Signed)

## 2018-06-11 ENCOUNTER — Encounter: Payer: Self-pay | Admitting: Nurse Practitioner

## 2018-06-11 LAB — ANA,IFA RA DIAG PNL W/RFLX TIT/PATN
ANA Titer 1: NEGATIVE
Cyclic Citrullin Peptide Ab: 3 units (ref 0–19)

## 2018-06-11 LAB — ANTI-DNA ANTIBODY, DOUBLE-STRANDED: dsDNA Ab: 1 IU/mL (ref 0–9)

## 2018-06-11 LAB — ANTI-SMITH ANTIBODY: ENA SM Ab Ser-aCnc: <0.2 AI (ref 0.0–0.9)

## 2018-06-11 LAB — C-REACTIVE PROTEIN: CRP: 2 mg/L (ref 0–10)

## 2018-06-11 LAB — SEDIMENTATION RATE: Sed Rate: 9 mm/hr (ref 0–32)

## 2018-06-14 ENCOUNTER — Telehealth: Payer: Self-pay

## 2018-06-14 NOTE — Telephone Encounter (Signed)
CMA attempt to reach patient to inform on results.  No answer and left a VM. If patient call back, please inform:  Still awaiting a few labs however most of your lab work is currently showing negative for an autoimmune disease, Rheumatoid arthritis or lupus.

## 2018-06-14 NOTE — Telephone Encounter (Signed)
-----   Message from Gildardo Pounds, NP sent at 06/11/2018 10:40 AM EDT ----- Still awaiting a few labs however most of your lab work is currently showing negative for an autoimmune disease, Rheumatoid arthritis or lupus.

## 2018-06-21 ENCOUNTER — Telehealth: Payer: Self-pay | Admitting: Nurse Practitioner

## 2018-06-21 NOTE — Telephone Encounter (Signed)
All of her labs are normal. There were no indicators of rheumatoid arthritis, auto immune disease or lupus

## 2018-06-21 NOTE — Telephone Encounter (Signed)
Patient would like lab results, patient was notified of lab results that have already arrived but would like to know the rest.

## 2018-06-21 NOTE — Telephone Encounter (Signed)
Will route to PCP 

## 2018-06-21 NOTE — Telephone Encounter (Signed)
CMA attempt to call patient to inform on results.  No answer and left a VM for patient to call back.

## 2018-06-30 ENCOUNTER — Ambulatory Visit: Payer: Self-pay | Attending: Nurse Practitioner | Admitting: Nurse Practitioner

## 2018-06-30 ENCOUNTER — Encounter: Payer: Self-pay | Admitting: Nurse Practitioner

## 2018-06-30 VITALS — BP 106/71 | HR 81 | Temp 98.6°F | Wt 152.6 lb

## 2018-06-30 DIAGNOSIS — M797 Fibromyalgia: Secondary | ICD-10-CM | POA: Insufficient documentation

## 2018-06-30 DIAGNOSIS — R51 Headache: Secondary | ICD-10-CM | POA: Insufficient documentation

## 2018-06-30 DIAGNOSIS — B001 Herpesviral vesicular dermatitis: Secondary | ICD-10-CM | POA: Insufficient documentation

## 2018-06-30 DIAGNOSIS — G8929 Other chronic pain: Secondary | ICD-10-CM

## 2018-06-30 DIAGNOSIS — G894 Chronic pain syndrome: Secondary | ICD-10-CM | POA: Insufficient documentation

## 2018-06-30 DIAGNOSIS — Z886 Allergy status to analgesic agent status: Secondary | ICD-10-CM | POA: Insufficient documentation

## 2018-06-30 DIAGNOSIS — Z90710 Acquired absence of both cervix and uterus: Secondary | ICD-10-CM | POA: Insufficient documentation

## 2018-06-30 DIAGNOSIS — Z79899 Other long term (current) drug therapy: Secondary | ICD-10-CM | POA: Insufficient documentation

## 2018-06-30 DIAGNOSIS — E785 Hyperlipidemia, unspecified: Secondary | ICD-10-CM | POA: Insufficient documentation

## 2018-06-30 DIAGNOSIS — Z8611 Personal history of tuberculosis: Secondary | ICD-10-CM | POA: Insufficient documentation

## 2018-06-30 DIAGNOSIS — Z833 Family history of diabetes mellitus: Secondary | ICD-10-CM | POA: Insufficient documentation

## 2018-06-30 MED ORDER — ACYCLOVIR 400 MG PO TABS: 400.0000 mg | ORAL_TABLET | Freq: Three times a day (TID) | ORAL | 0 refills | Status: AC

## 2018-06-30 MED ORDER — NORTRIPTYLINE HCL 50 MG PO CAPS: 50.0000 mg | ORAL_CAPSULE | Freq: Every day | ORAL | 2 refills | Status: DC

## 2018-06-30 MED ORDER — DULOXETINE HCL 30 MG PO CPEP: ORAL_CAPSULE | ORAL | 3 refills | Status: DC

## 2018-06-30 NOTE — Progress Notes (Signed)
Assessment & Plan:  Aimee Figueroa was seen today for follow-up and sore throat.  Diagnoses and all orders for this visit:  Chronic pain syndrome -     DULoxetine (CYMBALTA) 30 MG capsule; Take 1 capsule by mouth daily. May increase to 2 capsules after 1 week if no relief of pain. May not increase more than 2 capsules. -     nortriptyline (PAMELOR) 50 MG capsule; Take 1 capsule (50 mg total) by mouth at bedtime.  Chronic nonintractable headache, unspecified headache type -     nortriptyline (PAMELOR) 50 MG capsule; Take 1 capsule (50 mg total) by mouth at bedtime.  Herpes labialis -     acyclovir (ZOVIRAX) 400 MG tablet; Take 1 tablet (400 mg total) by mouth 3 (three) times daily for 7 days.    Patient has been counseled on age-appropriate routine health concerns for screening and prevention. These are reviewed and up-to-date. Referrals have been placed accordingly. Immunizations are up-to-date or declined.    Subjective:   Chief Complaint  Patient presents with  . Follow-up    Pt. is here to follow-up on headaches. Pt. stated the Notriptyline helps her little bit.   . Sore Throat    Pt. stated her throat hurts when she swallow.    HPI Aimee Figueroa - Aimee Figueroa 47 y.o. female presents to office today for follow up to headaches.  Once again she continues to have complaints of generalized body aches and pain as well as fatigue.  Extensive blood work has been obtained and was negative for any autoimmune diseases including rheumatoid arthritis and lupus.  At this time I would likely say she has a diagnosis of fibromyalgia.   Chronic Headaches She was prescribed nortriptyline 25 mg at bedtime for headaches at her last office visit with me last month.  Location of headaches varies and includes the frontal area, temporal, parietal as well as occipital.  She is concerned that she may need a CT scan.  Today she reports headaches are resolved at night however they returned during the  day.  She continues to decline antidepressant.  Many of her symptoms could possibly be related to untreated depression.  She endorses increased stress and worry.  I will increase her nortriptyline to see if this will help with control of her headaches.  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 Depression screen PHQ 2/9 06/09/2018  Decreased Interest 1  Down, Depressed, Hopeless 1  PHQ - 2 Score 2  Altered sleeping 0  Tired, decreased energy 1  Change in appetite 0  Feeling bad or failure about yourself  1  Trouble concentrating -  Moving slowly or fidgety/restless 0  Suicidal thoughts 0  PHQ-9 Score 4  Difficult doing work/chores -  Some recent data might be hidden   Chronic pain She continues to endorse fatigue and chronic pain in her bilateral shoulders, arms and legs as well as her back.  The fatigue includes anxiousness, insomnia, general malaise, headaches, and lack of interest in usual activities.  Symptoms have been worsening over the past few months and she reports struggling to carry out day-to-day responsibilities due to stress and pain.  Will start patient on duloxetine 30 mg today.  She has been instructed to increase to 60 mg after 1 week if able to tolerate and if symptoms have not improved on 30 mg.    Review of Systems  Constitutional: Negative for  fever, malaise/fatigue and weight loss.  HENT: Positive for sore throat. Negative for nosebleeds.   Eyes: Negative.  Negative for blurred vision, double vision and photophobia.  Respiratory: Negative.  Negative for cough and shortness of breath.   Cardiovascular: Negative.  Negative for chest pain, palpitations and leg swelling.  Gastrointestinal: Negative.  Negative for heartburn, nausea and vomiting.  Musculoskeletal: Positive for joint pain and myalgias.       Chronic generalized pain  Skin:       Oral lesions  Neurological: Positive for headaches. Negative for  dizziness, focal weakness and seizures.  Psychiatric/Behavioral: Positive for depression. Negative for suicidal ideas. The patient is nervous/anxious.     Past Medical History:  Diagnosis Date  . Abnormal Pap smear   . Allergy    seasonal  . Anxiety   . Depression   . Fibromyalgia   . Hyperlipemia   . Low blood pressure   . Pyloric ulcer associated with Helicobacter pylori, acute 05/22/2017   patient not sure if she has an ulcer - first dx  . SVD (spontaneous vaginal delivery)    x 4  . Tuberculosis    47 yrs old    Past Surgical History:  Procedure Laterality Date  . CESAREAN SECTION    . COLONOSCOPY    . GYNECOLOGIC CRYOSURGERY    . LAPAROSCOPIC ASSISTED VAGINAL HYSTERECTOMY N/A 05/26/2017   Procedure: LAPAROSCOPIC ASSISTED VAGINAL HYSTERECTOMY;  Surgeon: [name], MD;  Location: [facility];  Service: Gynecology;  Laterality: N/A;  . LAPAROSCOPIC BILATERAL SALPINGECTOMY Bilateral 05/26/2017   Procedure: LAPAROSCOPIC BILATERAL SALPINGECTOMY;  Surgeon: [name], MD;  Location: [facility];  Service: Gynecology;  Laterality: Bilateral;  . UPPER GI ENDOSCOPY      Family History  Problem Relation Age of Onset  . Diabetes Father   . Hyperlipidemia Father   . Hyperlipidemia Mother   . Uterine cancer Paternal Aunt   . Ulcerative colitis Cousin   . Anesthesia problems Neg Hx   . Colon cancer Neg Hx   . Rectal cancer Neg Hx   . Esophageal cancer Neg Hx   . Liver cancer Neg Hx     Social History Reviewed with no changes to be made today.   Outpatient Medications Prior to Visit  Medication Sig Dispense Refill  . atorvastatin (LIPITOR) 20 MG tablet Take 1 tablet (20 mg total) by mouth daily. 30 tablet 3  . Multiple Vitamin (MULTIVITAMIN WITH MINERALS) TABS tablet Take 1 tablet by mouth daily.    Marland Kitchen omega-3 acid ethyl esters (LOVAZA) 1 g capsule Take by mouth 2 (two) times daily.    . nortriptyline (PAMELOR) 25 MG capsule Take 1 capsule (25 mg total) by mouth at  bedtime. 90 capsule 2  . Alum & Mag Hydroxide-Simeth (GI COCKTAIL) SUSP suspension Take 10 mLs by mouth every 6 (six) hours as needed for indigestion. Shake well. (Patient not taking: Reported on 06/09/2018) 900 mL 1  . AMBULATORY NON FORMULARY MEDICATION Medication Name: Nitroglycerin ointment 0.125% Use pea sized amount rectally twice a day (Patient not taking: Reported on 06/30/2018) 30 g 1  . busPIRone (BUSPAR) 7.5 MG tablet Take 1 tablet (7.5 mg total) by mouth 2 (two) times daily. (Patient not taking: Reported on 06/09/2018) 60 tablet 1  . Cholecalciferol (VITAMIN D3) 2000 units TABS Take 2,000 Units by mouth daily.    . hydrocortisone (ANUSOL-HC) 25 MG suppository Place 1 suppository (25 mg total) rectally 2 (two) times daily. (Patient not taking: Reported on 06/30/2018) 10  suppository 0  . ibuprofen (ADVIL,MOTRIN) 800 MG tablet Take 1 tablet (800 mg total) by mouth 3 (three) times daily. X 5 days then prn headache/pain (Patient not taking: Reported on 06/30/2018) 30 tablet 1  . omeprazole (PRILOSEC) 20 MG capsule Take 1 capsule (20 mg total) by mouth daily. 30 minutes before breakfast (Patient not taking: Reported on 06/09/2018) 90 capsule 3  . dicyclomine (BENTYL) 20 MG tablet Take 1 tablet (20 mg total) by mouth 4 (four) times daily -  before meals and at bedtime. (Patient not taking: Reported on 06/03/2018) 120 tablet 2  . fluticasone (FLONASE) 50 MCG/ACT nasal spray Place 2 sprays into both nostrils daily. (Patient not taking: Reported on 06/30/2018) 16 g 6  . methocarbamol (ROBAXIN) 500 MG tablet Take 1 tablet (500 mg total) by mouth 3 (three) times daily. X 5 days then prn muscle spasms (Patient not taking: Reported on 06/30/2018) 60 tablet 0   Facility-Administered Medications Prior to Visit  Medication Dose Route Frequency Provider Last Rate Last Dose  . 0.9 %  sodium chloride infusion  500 mL Intravenous Once Nandigam, Venia Minks, MD        Allergies  Allergen Reactions  . Tylenol  [Acetaminophen] Rash       Objective:    BP 106/71 (BP Location: Left Arm, Patient Position: Sitting, Cuff Size: Normal)   Pulse 81   Temp 98.6 F (37 C) (Oral)   Wt 152 lb 9.6 oz (69.2 kg)   LMP 05/01/2017   SpO2 96%   BMI 28.83 kg/m  Wt Readings from Last 3 Encounters:  06/30/18 152 lb 9.6 oz (69.2 kg)  06/09/18 154 lb 6.4 oz (70 kg)  06/03/18 152 lb 12.8 oz (69.3 kg)    Physical Exam  Constitutional: She is oriented to person, place, and time. She appears well-developed and well-nourished. She is cooperative.  HENT:  Head: Normocephalic and atraumatic.  Right Ear: Hearing, tympanic membrane and ear canal normal.  Left Ear: Hearing, tympanic membrane and ear canal normal.  Mouth/Throat: Uvula is midline and oropharynx is clear and moist. No posterior oropharyngeal edema or posterior oropharyngeal erythema. Tonsils are 2+ on the right. Tonsils are 2+ on the left. No tonsillar exudate.  She has 2 small vesicular lesions. One is located under the upper left lip and second lesion is located under the bottom left lip.   Eyes: EOM are normal.  Neck: Normal range of motion.  Cardiovascular: Normal rate, regular rhythm, normal heart sounds and intact distal pulses. Exam reveals no gallop and no friction rub.  No murmur heard. Pulmonary/Chest: Effort normal and breath sounds normal. No tachypnea. No respiratory distress. She has no decreased breath sounds. She has no wheezes. She has no rhonchi. She has no rales. She exhibits no tenderness.  Abdominal: Bowel sounds are normal.  Musculoskeletal: Normal range of motion. She exhibits tenderness. She exhibits no edema or deformity.  Neurological: She is alert and oriented to person, place, and time. Coordination normal.  Skin: Skin is warm and dry.     Psychiatric: Her speech is normal and behavior is normal. Judgment and thought content normal. Cognition and memory are normal. She exhibits a depressed mood. She expresses no homicidal  and no suicidal ideation. She expresses no suicidal plans and no homicidal plans.  Nursing note and vitals reviewed.        Patient has been counseled extensively about nutrition and exercise as well as the importance of adherence with medications and regular follow-up. The  patient was given clear instructions to go to ER or return to medical center if symptoms don't improve, worsen or new problems develop. The patient verbalized understanding.   Follow-up: Return in about 4 weeks (around 07/28/2018) for headache and body pain.   Gildardo Pounds, FNP-BC [facility] [place], [place]   06/30/2018, 7:55 PM

## 2018-07-05 ENCOUNTER — Ambulatory Visit: Payer: Self-pay

## 2018-07-14 ENCOUNTER — Ambulatory Visit: Payer: Self-pay | Attending: Nurse Practitioner

## 2018-07-30 ENCOUNTER — Ambulatory Visit: Payer: Self-pay | Attending: Nurse Practitioner | Admitting: Nurse Practitioner

## 2018-07-30 ENCOUNTER — Encounter: Payer: Self-pay | Admitting: Nurse Practitioner

## 2018-07-30 VITALS — BP 110/76 | HR 68 | Temp 98.1°F | Ht 61.0 in | Wt 150.6 lb

## 2018-07-30 DIAGNOSIS — Z8611 Personal history of tuberculosis: Secondary | ICD-10-CM | POA: Insufficient documentation

## 2018-07-30 DIAGNOSIS — H53149 Visual discomfort, unspecified: Secondary | ICD-10-CM | POA: Insufficient documentation

## 2018-07-30 DIAGNOSIS — Z833 Family history of diabetes mellitus: Secondary | ICD-10-CM | POA: Insufficient documentation

## 2018-07-30 DIAGNOSIS — Z79899 Other long term (current) drug therapy: Secondary | ICD-10-CM | POA: Insufficient documentation

## 2018-07-30 DIAGNOSIS — Z886 Allergy status to analgesic agent status: Secondary | ICD-10-CM | POA: Insufficient documentation

## 2018-07-30 DIAGNOSIS — G44229 Chronic tension-type headache, not intractable: Secondary | ICD-10-CM | POA: Insufficient documentation

## 2018-07-30 DIAGNOSIS — Z83438 Family history of other disorder of lipoprotein metabolism and other lipidemia: Secondary | ICD-10-CM | POA: Insufficient documentation

## 2018-07-30 DIAGNOSIS — E785 Hyperlipidemia, unspecified: Secondary | ICD-10-CM | POA: Insufficient documentation

## 2018-07-30 DIAGNOSIS — M797 Fibromyalgia: Secondary | ICD-10-CM | POA: Insufficient documentation

## 2018-07-30 NOTE — Progress Notes (Signed)
Assessment & Plan:  Aimee Figueroa was seen today for follow-up.  Diagnoses and all orders for this visit:  Chronic tension-type headache, not intractable -     Ambulatory referral to Neurology Nortriptyline increased from 25mg  to 50mg    Patient has been counseled on age-appropriate routine health concerns for screening and prevention. These are reviewed and up-to-date. Referrals have been placed accordingly. Immunizations are up-to-date or declined.    Subjective:   Chief Complaint  Patient presents with  . Follow-up    Pt. is here to follow-up on headaches and body pain. Pt. stated her headache got better but it is still there.    HPI Aimee Figueroa - Aimee Figueroa 47 y.o. female presents to office today for follow up of headaches.    Headaches  Ongoing for the past few months.  Rates headaches 5/10. She has been prescribed nortriptyline, flonase, ibuprofen 800 mg in the past which she reports have not relieved her headache pain. I recommended she see the optometrist at her last office visit. She does have a new pair of glasses today and reports her headaches did lessen in intensity and frequency after she received her glasses however they are still persistent. Worsening over the past 3 days. Described as tightness and pressure with photophobia and intermittent nausea. She denies any stroke symptoms. Headaches start in the occipital area and travel to the frontal area. Will refer to Neurology for further evaluation.    Review of Systems  Constitutional: Negative for fever, malaise/fatigue and weight loss.  HENT: Negative.  Negative for nosebleeds.   Eyes: Negative.  Negative for blurred vision, double vision and photophobia.  Respiratory: Negative.  Negative for cough and shortness of breath.   Cardiovascular: Negative.  Negative for chest pain, palpitations and leg swelling.  Gastrointestinal: Negative.  Negative for heartburn, nausea and vomiting.  Musculoskeletal: Negative.   Negative for myalgias.  Neurological: Positive for headaches. Negative for dizziness, focal weakness and seizures.  Psychiatric/Behavioral: Positive for depression. Negative for suicidal ideas. The patient is nervous/anxious.     Past Medical History:  Diagnosis Date  . Abnormal Pap smear   . Allergy    seasonal  . Anxiety   . Depression   . Fibromyalgia   . Hyperlipemia   . Low blood pressure   . Pyloric ulcer associated with Helicobacter pylori, acute 05/22/2017   patient not sure if she has an ulcer - first dx  . SVD (spontaneous vaginal delivery)    x 4  . Tuberculosis    47 yrs old    Past Surgical History:  Procedure Laterality Date  . CESAREAN SECTION    . COLONOSCOPY    . GYNECOLOGIC CRYOSURGERY    . LAPAROSCOPIC ASSISTED VAGINAL HYSTERECTOMY N/A 05/26/2017   Procedure: LAPAROSCOPIC ASSISTED VAGINAL HYSTERECTOMY;  Surgeon: [name], MD;  Location: [facility];  Service: Gynecology;  Laterality: N/A;  . LAPAROSCOPIC BILATERAL SALPINGECTOMY Bilateral 05/26/2017   Procedure: LAPAROSCOPIC BILATERAL SALPINGECTOMY;  Surgeon: [name], MD;  Location: [facility];  Service: Gynecology;  Laterality: Bilateral;  . UPPER GI ENDOSCOPY      Family History  Problem Relation Age of Onset  . Diabetes Father   . Hyperlipidemia Father   . Hyperlipidemia Mother   . Uterine cancer Paternal Aunt   . Ulcerative colitis Cousin   . Anesthesia problems Neg Hx   . Colon cancer Neg Hx   . Rectal cancer Neg Hx   . Esophageal cancer Neg Hx   . Liver cancer  Neg Hx     Social History Reviewed with no changes to be made today.   Outpatient Medications Prior to Visit  Medication Sig Dispense Refill  . atorvastatin (LIPITOR) 20 MG tablet Take 1 tablet (20 mg total) by mouth daily. 30 tablet 3  . Cholecalciferol (VITAMIN D3) 2000 units TABS Take 2,000 Units by mouth daily.    . nortriptyline (PAMELOR) 50 MG capsule Take 1 capsule (50 mg total) by mouth at bedtime. 90 capsule 2  .  omega-3 acid ethyl esters (LOVAZA) 1 g capsule Take by mouth 2 (two) times daily.    . Alum & Mag Hydroxide-Simeth (GI COCKTAIL) SUSP suspension Take 10 mLs by mouth every 6 (six) hours as needed for indigestion. Shake well. (Patient not taking: Reported on 06/09/2018) 900 mL 1  . AMBULATORY NON FORMULARY MEDICATION Medication Name: Nitroglycerin ointment 0.125% Use pea sized amount rectally twice a day (Patient not taking: Reported on 06/30/2018) 30 g 1  . busPIRone (BUSPAR) 7.5 MG tablet Take 1 tablet (7.5 mg total) by mouth 2 (two) times daily. (Patient not taking: Reported on 06/09/2018) 60 tablet 1  . DULoxetine (CYMBALTA) 30 MG capsule Take 1 capsule by mouth daily. May increase to 2 capsules after 1 week if no relief of pain. May not increase more than 2 capsules. (Patient not taking: Reported on 07/30/2018) 30 capsule 3  . hydrocortisone (ANUSOL-HC) 25 MG suppository Place 1 suppository (25 mg total) rectally 2 (two) times daily. (Patient not taking: Reported on 07/30/2018) 10 suppository 0  . ibuprofen (ADVIL,MOTRIN) 800 MG tablet Take 1 tablet (800 mg total) by mouth 3 (three) times daily. X 5 days then prn headache/pain (Patient not taking: Reported on 06/30/2018) 30 tablet 1  . Multiple Vitamin (MULTIVITAMIN WITH MINERALS) TABS tablet Take 1 tablet by mouth daily.    Marland Kitchen omeprazole (PRILOSEC) 20 MG capsule Take 1 capsule (20 mg total) by mouth daily. 30 minutes before breakfast (Patient not taking: Reported on 06/09/2018) 90 capsule 3   Facility-Administered Medications Prior to Visit  Medication Dose Route Frequency Provider Last Rate Last Dose  . 0.9 %  sodium chloride infusion  500 mL Intravenous Once Nandigam, Venia Minks, MD        Allergies  Allergen Reactions  . Tylenol [Acetaminophen] Rash       Objective:    BP 110/76 (BP Location: Left Arm, Patient Position: Sitting, Cuff Size: Normal)   Pulse 68   Temp 98.1 F (36.7 C) (Oral)   Ht 5\' 1"  (1.549 m)   Wt 150 lb 9.6 oz (68.3 kg)    LMP 05/01/2017   SpO2 95%   BMI 28.46 kg/m  Wt Readings from Last 3 Encounters:  07/30/18 150 lb 9.6 oz (68.3 kg)  06/30/18 152 lb 9.6 oz (69.2 kg)  06/09/18 154 lb 6.4 oz (70 kg)    Physical Exam  Constitutional: She is oriented to person, place, and time. She appears well-developed and well-nourished. She is cooperative.  HENT:  Head: Normocephalic and atraumatic.  Eyes: EOM are normal.  Neck: Normal range of motion.  Cardiovascular: Normal rate, regular rhythm, normal heart sounds and intact distal pulses. Exam reveals no gallop and no friction rub.  No murmur heard. Pulmonary/Chest: Effort normal and breath sounds normal. No tachypnea. No respiratory distress. She has no decreased breath sounds. She has no wheezes. She has no rhonchi. She has no rales. She exhibits no tenderness.  Abdominal: Soft. Bowel sounds are normal.  Musculoskeletal: Normal range of motion.  She exhibits no edema.  Neurological: She is alert and oriented to person, place, and time. She has normal strength. No cranial nerve deficit or sensory deficit. She displays a negative Romberg sign. Coordination and gait normal.  Skin: Skin is warm and dry.  Psychiatric: She has a normal mood and affect. Her behavior is normal. Judgment and thought content normal.  Nursing note and vitals reviewed.      Patient has been counseled extensively about nutrition and exercise as well as the importance of adherence with medications and regular follow-up. The patient was given clear instructions to go to ER or return to medical center if symptoms don't improve, worsen or new problems develop. The patient verbalized understanding.   Follow-up: Return in about 3 months (around 10/30/2018) for headaches.   Gildardo Pounds, FNP-BC [facility] [place], [place]   08/01/2018, 1:39 PM

## 2018-07-30 NOTE — Patient Instructions (Signed)
Tension Headache A tension headache is pain, pressure, or aching that is felt over the front and sides of your head. These headaches can last from 30 minutes to several days. Follow these instructions at home: Managing pain  Take over-the-counter and prescription medicines only as told by your doctor.  Lie down in a dark, quiet room when you have a headache.  If directed, apply ice to your head and neck area: ? Put ice in a plastic bag. ? Place a towel between your skin and the bag. ? Leave the ice on for 20 minutes, 2-3 times per day.  Use a heating pad or a hot shower to apply heat to your head and neck area as told by your doctor. Eating and drinking  Eat meals on a regular schedule.  Do not drink a lot of alcohol.  Do not use a lot of caffeine, or stop using caffeine. General instructions  Keep all follow-up visits as told by your doctor. This is important.  Keep a journal to find out if certain things bring on headaches. For example, write down: ? What you eat and drink. ? How much sleep you get. ? Any change to your diet or medicines.  Try getting a massage, or doing other things that help you to relax.  Lessen stress.  Sit up straight. Do not tighten (tense) your muscles.  Do not use tobacco products. This includes cigarettes, chewing tobacco, or e-cigarettes. If you need help quitting, ask your doctor.  Exercise regularly as told by your doctor.  Get enough sleep. This may mean 7-9 hours of sleep. Contact a doctor if:  Your symptoms are not helped by medicine.  You have a headache that feels different from your usual headache.  You feel sick to your stomach (nauseous) or you throw up (vomit).  You have a fever. Get help right away if:  Your headache becomes very bad.  You keep throwing up.  You have a stiff neck.  You have trouble seeing.  You have trouble speaking.  You have pain in your eye or ear.  Your muscles are weak or you lose muscle  control.  You lose your balance or you have trouble walking.  You feel like you will pass out (faint) or you pass out.  You have confusion. This information is not intended to replace advice given to you by your health care provider. Make sure you discuss any questions you have with your health care provider. Document Released: 12/10/2009 Document Revised: 05/15/2016 Document Reviewed: 01/08/2015 Elsevier Interactive Patient Education  2018 [employer]  

## 2018-08-01 ENCOUNTER — Encounter: Payer: Self-pay | Admitting: Nurse Practitioner

## 2018-08-04 ENCOUNTER — Encounter: Payer: Self-pay | Admitting: Gastroenterology

## 2018-08-04 ENCOUNTER — Ambulatory Visit (INDEPENDENT_AMBULATORY_CARE_PROVIDER_SITE_OTHER): Payer: Self-pay | Admitting: Gastroenterology

## 2018-08-04 ENCOUNTER — Encounter: Payer: Self-pay | Admitting: Neurology

## 2018-08-04 VITALS — BP 110/70 | HR 89 | Ht 61.0 in | Wt 150.6 lb

## 2018-08-04 DIAGNOSIS — R208 Other disturbances of skin sensation: Secondary | ICD-10-CM

## 2018-08-04 DIAGNOSIS — K602 Anal fissure, unspecified: Secondary | ICD-10-CM

## 2018-08-04 DIAGNOSIS — R1084 Generalized abdominal pain: Secondary | ICD-10-CM

## 2018-08-04 DIAGNOSIS — K581 Irritable bowel syndrome with constipation: Secondary | ICD-10-CM

## 2018-08-04 NOTE — Patient Instructions (Addendum)
Continue Nitroglycerin for additional 2 months  Recticare as needed  Benefiber 1 teaspoon three times a day    Follow up in 3  months   If you are age 46 or older, your body mass index should be between 23-30. Your Body mass index is 28.46 kg/m. If this is out of the aforementioned range listed, please consider follow up with your Primary Care Provider.  If you are age 31 or younger, your body mass index should be between 19-25. Your Body mass index is 28.46 kg/m. If this is out of the aformentioned range listed, please consider follow up with your Primary Care Provider.   Thank you for choosing [facility]  Karleen Hampshire Nandigam,MD

## 2018-08-04 NOTE — Progress Notes (Signed)
[address] [name]    [id number]    [birth year]  Primary Care Physician:Fleming, Vernia Buff, NP  Referring Physician: Gildardo Pounds, NP [place], [place] [address]  Chief complaint: Anal fissure  HPI: 47 year old female with history of irritable bowel syndrome and chronic anal fissure here for follow-up visit.  She continues to have intermittent burning sensation and discomfort in the rectum.  She is using rectal nitroglycerin on and off.  Her symptoms are getting better but not completely gone.  She continues to strain with bowel movement and has hard stool sometimes.  She has intermittent upper abdominal discomfort and cramping.  Denies any rectal bleeding.  Denies any loss of appetite or weight loss.  No nausea or vomiting. H. pylori gastritis status post eradication. EGD, colonoscopy and CT abdomen and pelvis for evaluation of abdominal pain unrevealing for any significant pathology.   Outpatient Encounter Medications as of 08/04/2018  Medication Sig  . atorvastatin (LIPITOR) 20 MG tablet Take 1 tablet (20 mg total) by mouth daily.  . busPIRone (BUSPAR) 7.5 MG tablet Take 1 tablet (7.5 mg total) by mouth 2 (two) times daily.  . Cholecalciferol (VITAMIN D3) 2000 units TABS Take 2,000 Units by mouth daily.  . nortriptyline (PAMELOR) 50 MG capsule Take 1 capsule (50 mg total) by mouth at bedtime.  Marland Kitchen omega-3 acid ethyl esters (LOVAZA) 1 g capsule Take by mouth 2 (two) times daily.  . [DISCONTINUED] DULoxetine (CYMBALTA) 30 MG capsule Take 1 capsule by mouth daily. May increase to 2 capsules after 1 week if no relief of pain. May not increase more than 2 capsules. (Patient not taking: Reported on 08/04/2018)  . [DISCONTINUED] hydrocortisone (ANUSOL-HC) 25 MG suppository Place 1 suppository (25 mg total) rectally 2 (two) times daily. (Patient not taking: Reported on 08/04/2018)  . [DISCONTINUED] ibuprofen (ADVIL,MOTRIN) 800 MG tablet Take 1 tablet (800 mg  total) by mouth 3 (three) times daily. X 5 days then prn headache/pain (Patient not taking: Reported on 08/04/2018)  . [DISCONTINUED] Multiple Vitamin (MULTIVITAMIN WITH MINERALS) TABS tablet Take 1 tablet by mouth daily.  . [DISCONTINUED] omeprazole (PRILOSEC) 20 MG capsule Take 1 capsule (20 mg total) by mouth daily. 30 minutes before breakfast (Patient not taking: Reported on 08/04/2018)   Facility-Administered Encounter Medications as of 08/04/2018  Medication  . 0.9 %  sodium chloride infusion    Allergies as of 08/04/2018 - Review Complete 08/01/2018  Allergen Reaction Noted  . Tylenol [acetaminophen] Rash 09/17/2011    Past Medical History:  Diagnosis Date  . Abnormal Pap smear   . Allergy    seasonal  . Anxiety   . Depression   . Fibromyalgia   . Hyperlipemia   . Low blood pressure   . Pyloric ulcer associated with Helicobacter pylori, acute 05/22/2017   patient not sure if she has an ulcer - first dx  . SVD (spontaneous vaginal delivery)    x 4  . Tuberculosis    47 yrs old    Past Surgical History:  Procedure Laterality Date  . CESAREAN SECTION    . COLONOSCOPY    . GYNECOLOGIC CRYOSURGERY    . LAPAROSCOPIC ASSISTED VAGINAL HYSTERECTOMY N/A 05/26/2017   Procedure: LAPAROSCOPIC ASSISTED VAGINAL HYSTERECTOMY;  Surgeon: [name], MD;  Location: [facility];  Service: Gynecology;  Laterality: N/A;  . LAPAROSCOPIC BILATERAL SALPINGECTOMY Bilateral 05/26/2017   Procedure: LAPAROSCOPIC BILATERAL SALPINGECTOMY;  Surgeon: [name], MD;  Location:  [facility];  Service: Gynecology;  Laterality: Bilateral;  . UPPER GI ENDOSCOPY      Family History  Problem Relation Age of Onset  . Diabetes Father   . Hyperlipidemia Father   . Hyperlipidemia Mother   . Uterine cancer Paternal Aunt   . Ulcerative colitis Cousin   . Anesthesia problems Neg Hx   . Colon cancer Neg Hx   . Rectal cancer Neg Hx   . Esophageal cancer Neg Hx   . Liver cancer Neg Hx     Social  History   Socioeconomic History  . Marital status: Married    Spouse name: Not on file  . Number of children: 5  . Years of education: Not on file  . Highest education level: Not on file  Occupational History  . Occupation: homemaker  Social Needs  . Financial resource strain: Not on file  . Food insecurity:    Worry: Not on file    Inability: Not on file  . Transportation needs:    Medical: Not on file    Non-medical: Not on file  Tobacco Use  . Smoking status: Never Smoker  . Smokeless tobacco: Never Used  Substance and Sexual Activity  . Alcohol use: No  . Drug use: No  . Sexual activity: Yes    Partners: Male    Birth control/protection: Injection  Lifestyle  . Physical activity:    Days per week: Not on file    Minutes per session: Not on file  . Stress: Not on file  Relationships  . Social connections:    Talks on phone: Not on file    Gets together: Not on file    Attends religious service: Not on file    Active member of club or organization: Not on file    Attends meetings of clubs or organizations: Not on file    Relationship status: Not on file  . Intimate partner violence:    Fear of current or ex partner: Not on file    Emotionally abused: Not on file    Physically abused: Not on file    Forced sexual activity: Not on file  Other Topics Concern  . Not on file  Social History Narrative  . Not on file      Review of systems:anal fissure Review of Systems  Constitutional: Negative for fever and chills.  HENT: Negative.   Eyes: Negative for blurred vision.  Respiratory: Negative for cough, shortness of breath and wheezing.   Cardiovascular: Negative for chest pain and palpitations.  Gastrointestinal: as per HPI Genitourinary: Negative for dysuria, urgency, frequency and hematuria.  Musculoskeletal: Positive for myalgias, back pain and joint pain.  Skin: Negative for itching and rash.  Neurological: Negative for dizziness, tremors, focal  weakness, seizures and loss of consciousness.  Positive for frequent headaches Endo/Heme/Allergies: Positive for seasonal allergies.  Psychiatric/Behavioral: Negative for depression, suicidal ideas and hallucinations.  Positive for anxiety All other systems reviewed and are negative.   Physical Exam: Vitals:   08/04/18 0901  BP: 110/70  Pulse: 89  SpO2: 98%   Body mass index is 28.46 kg/m. Gen:      No acute distress HEENT:  EOMI, sclera anicteric Neck:     No masses; no thyromegaly Lungs:    Clear to auscultation bilaterally; normal respiratory effort CV:         Regular rate and rhythm; no murmurs Abd:      + bowel sounds; soft, non-tender; no palpable masses, no  distension Ext:    No edema; adequate peripheral perfusion Skin:      Warm and dry; no rash Neuro: alert and oriented x 3 Psych: normal mood and affect  Data Reviewed:  Reviewed labs, radiology imaging, old records and pertinent past GI work up   Assessment and Plan/Recommendations:  47 year old female with history of chronic mild abdominal discomfort/ pain unclear etiology and irritable bowel syndrome here with recurrent anal fissure Advised patient to increase dietary fiber and fluid intake to avoid constipation and hard stool Benefiber 1 teaspoon 3 times daily with meals Rectal nitroglycerin 0.125% small pea-sized amount 3 times daily for 6 to 8 weeks RectiCare small pea-sized amount as needed for rectal discomfort Chronic abdominal pain and discomfort is improving, extensive GI work-up negative for any significant pathology Return in 3 months or sooner if needed  25 minutes was spent face-to-face with the patient. Greater than 50% of the time used for counseling as well as treatment plan and follow-up. She had multiple questions which were answered to her satisfaction  K. Denzil Magnuson , MD [phone]    CC: Gildardo Pounds, NP

## 2018-08-12 ENCOUNTER — Encounter: Payer: Self-pay | Admitting: Gastroenterology

## 2018-09-01 NOTE — Progress Notes (Signed)
Ms. Aimee Figueroa received her flu shot at the [facility] on 12/3 to her LT deltoid by the undersigned. Lot#[id number] NDC: [id number] HQI:[id number] Exp: 03/29/19

## 2018-09-17 NOTE — Telephone Encounter (Signed)
Done

## 2018-10-05 ENCOUNTER — Encounter: Payer: Self-pay | Admitting: Critical Care Medicine

## 2018-10-05 ENCOUNTER — Ambulatory Visit: Payer: Self-pay | Attending: Critical Care Medicine | Admitting: Critical Care Medicine

## 2018-10-05 VITALS — BP 104/68 | HR 69 | Temp 98.1°F | Resp 16 | Wt 150.0 lb

## 2018-10-05 DIAGNOSIS — Z886 Allergy status to analgesic agent status: Secondary | ICD-10-CM | POA: Insufficient documentation

## 2018-10-05 DIAGNOSIS — K120 Recurrent oral aphthae: Secondary | ICD-10-CM | POA: Insufficient documentation

## 2018-10-05 DIAGNOSIS — K219 Gastro-esophageal reflux disease without esophagitis: Secondary | ICD-10-CM | POA: Insufficient documentation

## 2018-10-05 DIAGNOSIS — Z79899 Other long term (current) drug therapy: Secondary | ICD-10-CM | POA: Insufficient documentation

## 2018-10-05 MED ORDER — OMEPRAZOLE 40 MG PO CPDR: 40.0000 mg | DELAYED_RELEASE_CAPSULE | Freq: Every day | ORAL | 6 refills | Status: DC

## 2018-10-05 MED ORDER — DEXAMETHASONE 0.5 MG/5ML PO ELIX: 0.5000 mg | ORAL_SOLUTION | Freq: Three times a day (TID) | ORAL | 0 refills | Status: DC

## 2018-10-05 NOTE — Progress Notes (Signed)
Oral mucosa is more sensitive. Notices more cuts and bruises.  Intermittent blisters on tongue

## 2018-10-05 NOTE — Assessment & Plan Note (Signed)
Multiple aphthous ulcers in the mouth which is a recurrent problem  Plan  Administer dexamethasone elixir 0.5 mg and 5 mL concentration the patient is to use 0.5 mg 3 times daily swish and expectorate  The patient also needs to follow a reflux diet and use her omeprazole on a more regular basis  The patient was instructed also to return to her dentist for further re-evaluations

## 2018-10-05 NOTE — Progress Notes (Addendum)
Subjective:    Patient ID: Aimee Figueroa, female    DOB: 02-28-1971, 48 y.o.   MRN: [id number]  47 y.o.F here for work in visit for mouth pain. Has have several visits for mouth issues and today's issue is a NEW PROBLEM The patient reports recurrent ulcerations in both cheek mucosa and also the lower lip area.  On occasion there is enough swelling in this area that recurs and results in biting the inside of the cheek.  The patient does have reflux disease and has ongoing belching and burping and is not been taking her reflux medicine on a regular basis. Any spicy or hot foods results in burning and irritation in the mouth. She has been rinsing with baking soda and water She states her teeth and gums are okay and not having difficulty  Past Medical History:  Diagnosis Date  . Abnormal Pap smear   . Allergy    seasonal  . Anxiety   . Depression   . Fibromyalgia   . Hyperlipemia   . Low blood pressure   . Pyloric ulcer associated with Helicobacter pylori, acute 05/22/2017   patient not sure if she has an ulcer - first dx  . SVD (spontaneous vaginal delivery)    x 4  . Tuberculosis    48 yrs old     Family History  Problem Relation Age of Onset  . Diabetes Father   . Hyperlipidemia Father   . Hyperlipidemia Mother   . Uterine cancer Paternal Aunt   . Ulcerative colitis Cousin   . Anesthesia problems Neg Hx   . Colon cancer Neg Hx   . Rectal cancer Neg Hx   . Esophageal cancer Neg Hx   . Liver cancer Neg Hx      Social History   Socioeconomic History  . Marital status: Married    Spouse name: Not on file  . Number of children: 5  . Years of education: Not on file  . Highest education level: Not on file  Occupational History  . Occupation: homemaker  Social Needs  . Financial resource strain: Not on file  . Food insecurity:    Worry: Not on file    Inability: Not on file  . Transportation needs:    Medical: Not on file    Non-medical: Not on  file  Tobacco Use  . Smoking status: Never Smoker  . Smokeless tobacco: Never Used  Substance and Sexual Activity  . Alcohol use: No  . Drug use: No  . Sexual activity: Yes    Partners: Male    Birth control/protection: Injection  Lifestyle  . Physical activity:    Days per week: Not on file    Minutes per session: Not on file  . Stress: Not on file  Relationships  . Social connections:    Talks on phone: Not on file    Gets together: Not on file    Attends religious service: Not on file    Active member of club or organization: Not on file    Attends meetings of clubs or organizations: Not on file    Relationship status: Not on file  . Intimate partner violence:    Fear of current or ex partner: Not on file    Emotionally abused: Not on file    Physically abused: Not on file    Forced sexual activity: Not on file  Other Topics Concern  . Not on file  Social History Narrative  .  Not on file     Allergies  Allergen Reactions  . Tylenol [Acetaminophen] Rash     Outpatient Medications Prior to Visit  Medication Sig Dispense Refill  . atorvastatin (LIPITOR) 20 MG tablet Take 1 tablet (20 mg total) by mouth daily. 30 tablet 3  . Cholecalciferol (VITAMIN D3) 2000 units TABS Take 2,000 Units by mouth daily.    . hydrocortisone cream 1 % Apply 1 application topically daily as needed.    . nortriptyline (PAMELOR) 50 MG capsule Take 1 capsule (50 mg total) by mouth at bedtime. 90 capsule 2  . omega-3 acid ethyl esters (LOVAZA) 1 g capsule Take by mouth 2 (two) times daily.    Marland Kitchen omeprazole (PRILOSEC) 40 MG capsule Take 40 mg by mouth daily before breakfast.    . busPIRone (BUSPAR) 7.5 MG tablet Take 1 tablet (7.5 mg total) by mouth 2 (two) times daily. (Patient not taking: Reported on 10/05/2018) 60 tablet 1   Facility-Administered Medications Prior to Visit  Medication Dose Route Frequency Provider Last Rate Last Dose  . 0.9 %  sodium chloride infusion  500 mL Intravenous Once  Nandigam, Kavitha V, MD          Review of Systems  Constitutional: Negative for activity change, appetite change, fatigue, fever and unexpected weight change.  HENT: Positive for mouth sores. Negative for dental problem, drooling, facial swelling, sinus pressure, sinus pain, sore throat, trouble swallowing and voice change.   Respiratory: Negative for cough and choking.   Cardiovascular: Negative.   Gastrointestinal:       Pt with acid reflux.  Sees GI for same.  Is better  Genitourinary: Negative.        Objective:   Physical Exam  BP 104/68 (BP Location: Left Arm, Patient Position: Sitting, Cuff Size: Normal)   Pulse 69   Temp 98.1 F (36.7 C) (Oral)   Resp 16   Wt 150 lb (68 kg)   LMP 05/01/2017   SpO2 100%   BMI 28.34 kg/m   Gen: Pleasant, well-nourished, in no distress,  normal affect  ENT: Multiple aphthous ulcers seen in both cheek mucosa and also lower lip also evidence for healing ulceration in the roof of the mouth as well     no postnasal drip  Neck: No JVD, no TMG, no carotid bruits    there is no cervical lymph adenopathy  Lungs: No use of accessory muscles, no dullness to percussion, clear without rales or rhonchi  Cardiovascular: RRR, heart sounds normal, no murmur or gallops, no peripheral edema  Abdomen: soft and NT, no HSM,  BS normal  Musculoskeletal: No deformities, no cyanosis or clubbing  Neuro: alert, non focal  Skin: Warm, no lesions or rashes         Assessment & Plan:  I personally reviewed all images and lab data in the [facility] system as well as any outside material available during this office visit and agree with the  radiology impressions.   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 which is a recurrent problem  Plan  Administer dexamethasone elixir 0.5 mg and 5 mL concentration the patient is to use 0.5 mg 3 times daily swish and expectorate  The patient also needs to follow a reflux diet and use her  omeprazole on a more regular basis  The patient was instructed also to return to her dentist for further re-evaluations  Gastroesophageal reflux disease without esophagitis Gastroesophageal reflux disease with need for increased proton  pump inhibitor compliance   Diagnoses and all orders for this visit:  Aphthous ulcer of mouth  Gastroesophageal reflux disease without esophagitis  Other orders -     omeprazole (PRILOSEC) 40 MG capsule; Take 1 capsule (40 mg total) by mouth daily before breakfast. -     dexamethasone 0.5 MG/5ML elixir; Take 5 mLs (0.5 mg total) by mouth 3 (three) times daily. Rinse swish and spit out

## 2018-10-05 NOTE — Patient Instructions (Signed)
Obtain dexamethasone elixir and swish and spit out 5 mL's 3 times daily  Obtain omeprazole and begin 40 mg every day at breakfast before meals  Follow a reflux diet as below  Obtain a dental evaluation with your dentist as soon as possible  Is okay but she would benefit from [place] para pacientes adultos con enfermedad de reflujo [place] for Gastroesophageal Reflux Disease, Adult Si tiene enfermedad de reflujo gastroesofgico (ERGE), los alimentos que consume y los hbitos de alimentacin son Theatre stage manager. Elegir los alimentos adecuados puede ayudar a Public house manager las molestias ocasionadas por la [employer]. Considere la posibilidad de trabajar con un especialista en dieta y nutricin (nutricionista) que lo ayude a Adult nurse saludables. Qu pautas generales debo seguir?  Plan de alimentacin  Elija alimentos saludables con bajo contenido de grasa, como frutas, verduras, cereales integrales, productos lcteos descremados, carne magra de vaca, de pescado y de ave.  Haga comidas pequeas con frecuencia en lugar de tres comidas abundantes al da. Coma lentamente, en un ambiente distendido. Evite agacharse o recostarse hasta despus de 2 o 3horas de haber comido.  Limite los alimentos con alto contenido graso como las carnes grasas o los alimentos fritos.  Limite el consumo de [place], [place] y [place] a menos de 8 cucharaditas al Training and development officer.  Evite lo siguiente: ? Consumir alimentos que le ocasionen sntomas. Pueden ser distintos para cada persona. Lleve un registro de los alimentos para identificar aquellos que le ocasionen sntomas. ? Consumir alcohol. ? Beber grandes cantidades de lquido con las comidas. ? Comer 2 o 3 horas antes de acostarse.  Cocine los alimentos utilizando mtodos que no sean la fritura. Esto puede Ship broker, Marine scientist y hervir. Estilo de vida  Mantenga un peso saludable. Pregunte a su mdico cul es el peso saludable para  usted. Si debe perder peso, hable con su mdico para hacerlo de manera segura.  Realice actividad fsica durante, al menos, 30 minutos 5 das por semana o ms, o segn lo indicado por su mdico.  Evite usar ropa ajustada alrededor de la cintura y Management consultant.  No consuma ningn producto que contenga nicotina o tabaco, como cigarrillos y Psychologist, sport and exercise. Si necesita ayuda para dejar de fumar, consulte al mdico.  Duerma con la cabecera de la cama elevada. Use una cua debajo del colchn o bloques debajo del armazn de la cama para Theatre manager la cabecera de la cama elevada. Qu alimentos no se recomiendan? Esta podra no ser [employer]. Hable con el nutricionista sobre las mejores opciones alimenticias para usted. Carbohidratos Pasteles o panes sin levadura con grasa agregada. Tostadas francesas. Verduras Verduras fritas en abundante aceite. Papas fritas. Cualquier verdura que est preparada con grasa agregada. Cualquier verdura que le ocasione sntomas. Para algunas personas, estas pueden incluir tomates y productos con tomate, [place], cebollas y [place], y rbanos picantes. Frutas Cualquier fruta que est preparada con grasa agregada. Cualquier fruta que le ocasione sntomas. Para algunas personas, estas pueden incluir, las frutas ctricas como naranja, pomelo, pia y limn. Carnes y otros alimentos ricos en protenas Carnes de alto contenido graso como carne grasa de vaca o cerdo, salchichas, costillas, [place], salchicha, salame y tocino. Carnes o protenas fritas, lo que incluye pescado frito y pollo frito. Nueces y [place] de frutos secos. Lcteos Leche entera y [place] con chocolate. [employer]. Crema. Helados. Queso crema. Batidos con [employer]. Bebidas Caf y t negro, con o sin cafena. Bebidas carbonatadas. Refrescos. Bebidas energizantes. Jugo de fruta hecho con frutas  cidas (como naranja o pomelo). Jugo de tomate. Bebidas alcohlicas. Grasas y [employer]. Margarina.  Lardo. Mantequilla clarificada. Dulces y postres Chocolate y cacao. Rosquillas. Condimentos y otros alimentos [place]. Menta y mentol. Cualquier condimento, hierbas o aderezos que le ocasionen sntomas. Para algunas personas, esto puede incluir curry, salsa picante o aderezos para ensalada a base de vinagre. Resumen  Si tiene enfermedad de reflujo gastroesofgico (ERGE), las elecciones de alimentos y [place] de vida son muy importantes para ayudar a Public house manager las molestias de la [place].  Haga comidas pequeas con frecuencia en lugar de tres comidas abundantes al da. Coma lentamente, en un ambiente distendido. Evite agacharse o recostarse hasta despus de 2 o 3horas de haber comido.  Limite los alimentos con alto contenido graso como la carne grasa o los alimentos fritos. Esta informacin no tiene Marine scientist el consejo del mdico. Asegrese de hacerle al mdico cualquier pregunta que tenga. Document Released: 06/25/2005 Document Revised: 01/05/2017 Document Reviewed: 07/20/2013 Elsevier Interactive Patient Education  2019 [employer].

## 2018-10-05 NOTE — Assessment & Plan Note (Signed)
Gastroesophageal reflux disease with need for increased proton pump inhibitor compliance

## 2018-10-11 ENCOUNTER — Ambulatory Visit (INDEPENDENT_AMBULATORY_CARE_PROVIDER_SITE_OTHER): Payer: Self-pay | Admitting: Gastroenterology

## 2018-10-11 ENCOUNTER — Encounter: Payer: Self-pay | Admitting: Gastroenterology

## 2018-10-11 VITALS — BP 110/68 | HR 62 | Ht 61.0 in | Wt 151.5 lb

## 2018-10-11 DIAGNOSIS — K5909 Other constipation: Secondary | ICD-10-CM

## 2018-10-11 DIAGNOSIS — K648 Other hemorrhoids: Secondary | ICD-10-CM

## 2018-10-11 NOTE — Progress Notes (Signed)
[address] Aimee Figueroa    [id number]    May 31, 1971  Primary Care Physician:Fleming, Vernia Buff, NP  Referring Physician: Gildardo Pounds, NP [place], [place] [address]  Chief complaint:  Anal burning  HPI: 48 year old female with history of chronic irritable bowel syndrome and anal fissures here for follow-up visit.  She was last seen in office August 04, 2018. She continues to have intermittent fullness and pressure in the anorectal region but no longer has severe pain or discomfort.  Denies any rectal bleeding. She is passing small pellets and hard stool occasionally, 1-2 bowel movements a day.  She is taking Benefiber once daily.  Takes occasional Dulcolax at bedtime as needed. Denies any abdominal pain, nausea, vomiting, dysphagia, loss of appetite or weight loss.  She has had extensive GI work-up in the past for evaluation of abdominal pain.  H. pylori gastritis status post eradication.  Otherwise EGD, colonoscopy, CT abdomen pelvis unremarkable.  Outpatient Encounter Medications as of 10/11/2018  Medication Sig  . atorvastatin (LIPITOR) 20 MG tablet Take 1 tablet (20 mg total) by mouth daily.  . Cholecalciferol (VITAMIN D3) 2000 units TABS Take 2,000 Units by mouth daily.  . hydrocortisone cream 1 % Apply 1 application topically daily as needed.  Marland Kitchen omega-3 acid ethyl esters (LOVAZA) 1 g capsule Take by mouth 2 (two) times daily.  Marland Kitchen omeprazole (PRILOSEC) 40 MG capsule Take 1 capsule (40 mg total) by mouth daily before breakfast.  . nortriptyline (PAMELOR) 50 MG capsule Take 1 capsule (50 mg total) by mouth at bedtime.  . [DISCONTINUED] busPIRone (BUSPAR) 7.5 MG tablet Take 1 tablet (7.5 mg total) by mouth 2 (two) times daily. (Patient not taking: Reported on 10/05/2018)  . [DISCONTINUED] dexamethasone 0.5 MG/5ML elixir Take 5 mLs (0.5 mg total) by mouth 3 (three) times daily. Rinse swish and spit out  . [DISCONTINUED] 0.9 %  sodium chloride infusion     No facility-administered encounter medications on file as of 10/11/2018.     Allergies as of 10/11/2018 - Review Complete 10/11/2018  Allergen Reaction Noted  . Tylenol [acetaminophen] Rash 09/17/2011    Past Medical History:  Diagnosis Date  . Abnormal Pap smear   . Allergy    seasonal  . Anxiety   . Depression   . Fibromyalgia   . Hyperlipemia   . Low blood pressure   . Pyloric ulcer associated with Helicobacter pylori, acute 05/22/2017   patient not sure if she has an ulcer - first dx  . SVD (spontaneous vaginal delivery)    x 4  . 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 CESAREAN SECTION    . COLONOSCOPY    . GYNECOLOGIC CRYOSURGERY    . LAPAROSCOPIC ASSISTED VAGINAL HYSTERECTOMY N/A 05/26/2017   Procedure: LAPAROSCOPIC ASSISTED VAGINAL HYSTERECTOMY;  Surgeon: [name], MD;  Location: [facility];  Service: Gynecology;  Laterality: N/A;  . LAPAROSCOPIC BILATERAL SALPINGECTOMY Bilateral 05/26/2017   Procedure: LAPAROSCOPIC BILATERAL SALPINGECTOMY;  Surgeon: [name], MD;  Location: [facility];  Service: Gynecology;  Laterality: Bilateral;  . UPPER GI ENDOSCOPY      Family History  Problem Relation Age of Onset  . Diabetes Father   . Hyperlipidemia Father   . Hyperlipidemia Mother   . Uterine cancer Paternal Aunt   . Ulcerative colitis Cousin   . Anesthesia problems Neg Hx   .  Colon cancer Neg Hx   . Rectal cancer Neg Hx   . Esophageal cancer Neg Hx   . Liver cancer Neg Hx     Social History   Socioeconomic History  . Marital status: Married    Spouse name: Not on file  . Number of children: 5  . Years of education: Not on file  . Highest education level: Not on file  Occupational History  . Occupation: homemaker  Social Needs  . Financial resource strain: Not on file  . Food insecurity:    Worry: Not on file    Inability: Not on file  . Transportation needs:    Medical: Not on file     Non-medical: Not on file  Tobacco Use  . Smoking status: Never Smoker  . Smokeless tobacco: Never Used  Substance and Sexual Activity  . Alcohol use: No  . Drug use: No  . Sexual activity: Yes    Partners: Male    Birth control/protection: Injection  Lifestyle  . Physical activity:    Days per week: Not on file    Minutes per session: Not on file  . Stress: Not on file  Relationships  . Social connections:    Talks on phone: Not on file    Gets together: Not on file    Attends religious service: Not on file    Active member of club or organization: Not on file    Attends meetings of clubs or organizations: Not on file    Relationship status: Not on file  . Intimate partner violence:    Fear of current or ex partner: Not on file    Emotionally abused: Not on file    Physically abused: Not on file    Forced sexual activity: Not on file  Other Topics Concern  . Not on file  Social History Narrative  . Not on file      Review of systems: Review of Systems  Constitutional: Negative for fever and chills.  HENT: Negative.   Eyes: Negative for blurred vision.  Respiratory: Negative for cough, shortness of breath and wheezing.   Cardiovascular: Negative for chest pain and palpitations.  Gastrointestinal: as per HPI Genitourinary: Negative for dysuria, urgency, frequency and hematuria.  Musculoskeletal: Negative for myalgias, back pain and joint pain.  Skin: Negative for itching and rash.  Neurological: Negative for dizziness, tremors, focal weakness, seizures and loss of consciousness.  Endo/Heme/Allergies: Positive for seasonal allergies.  Psychiatric/Behavioral: Negative for depression, suicidal ideas and hallucinations.  Positive for anxiety All other systems reviewed and are negative.   Physical Exam: Vitals:   10/11/18 0858  BP: 110/68  Pulse: 62   Body mass index is 28.63 kg/m. Gen:      No acute distress HEENT:  EOMI, sclera anicteric Neck:     No masses;  no thyromegaly Lungs:    Clear to auscultation bilaterally; normal respiratory effort CV:         Regular rate and rhythm; no murmurs Abd:      + bowel sounds; soft, non-tender; no palpable masses, no distension Ext:    No edema; adequate peripheral perfusion Skin:      Warm and dry; no rash Neuro: alert and oriented x 3 Psych: normal mood and affect Rectal exam: Normal anal sphincter tone, no anal fissure or external hemorrhoids Anoscopy: Small grade 2 internal hemorrhoids, no active bleeding, normal dentate line, no visible nodules  Data Reviewed:  Reviewed labs, radiology imaging, old records and pertinent  past GI work up   Assessment and Plan/Recommendations:  48 year old female with history of chronic irritable bowel syndrome, anal fissure resolved, internal hemorrhoids with constipation and rectal pressure MiraLAX 1 capful daily, titrate based on response with goal 1-2 soft bowel movements daily Fiber supplement 1 to 2 teaspoons twice daily Return in 3 months or sooner if needed  25 minutes was spent face-to-face with the patient. Greater than 50% of the time used for counseling as well as treatment plan and follow-up. She had multiple questions which were answered to her satisfaction  K. Denzil Magnuson , MD [phone]    CC: Gildardo Pounds, NP

## 2018-10-11 NOTE — Patient Instructions (Signed)
Continue Benefiber twice daily  Take Fiberchoice 1-2 tablets daily  Use Miralax 1 capful daily   Follow up in 3 months   If you are age 48 or older, your body mass index should be between 23-30. Your Body mass index is 28.63 kg/m. If this is out of the aforementioned range listed, please consider follow up with your Primary Care Provider.  If you are age 28 or younger, your body mass index should be between 19-25. Your Body mass index is 28.63 kg/m. If this is out of the aformentioned range listed, please consider follow up with your Primary Care Provider.    Thank you for choosing [facility]  Karleen Hampshire Nandigam,MD

## 2018-10-12 ENCOUNTER — Encounter: Payer: Self-pay | Admitting: Gastroenterology

## 2018-10-13 ENCOUNTER — Encounter: Payer: Self-pay | Admitting: Gastroenterology

## 2018-10-15 ENCOUNTER — Ambulatory Visit: Payer: Self-pay | Admitting: Neurology

## 2018-10-20 ENCOUNTER — Ambulatory Visit (INDEPENDENT_AMBULATORY_CARE_PROVIDER_SITE_OTHER): Payer: Self-pay | Admitting: Physician Assistant

## 2018-10-20 ENCOUNTER — Encounter: Payer: Self-pay | Admitting: Physician Assistant

## 2018-10-20 VITALS — BP 110/60 | HR 68 | Ht 61.0 in | Wt 149.0 lb

## 2018-10-20 DIAGNOSIS — R1012 Left upper quadrant pain: Secondary | ICD-10-CM

## 2018-10-20 DIAGNOSIS — G8929 Other chronic pain: Secondary | ICD-10-CM

## 2018-10-20 DIAGNOSIS — R1013 Epigastric pain: Secondary | ICD-10-CM

## 2018-10-20 DIAGNOSIS — R141 Gas pain: Secondary | ICD-10-CM

## 2018-10-20 MED ORDER — DICYCLOMINE HCL 10 MG PO CAPS: 10.0000 mg | ORAL_CAPSULE | Freq: Three times a day (TID) | ORAL | 3 refills | Status: DC

## 2018-10-20 NOTE — Patient Instructions (Signed)
Please purchase the following medications over the counter and take as directed:  FD gard 2 tabs twice a day for one month then 1 tablet twice a day   We have sent the following medications to your pharmacy for you to pick up at your convenience: Dicyclomine 10 mg four times a day 20-30 minutes before meals and at bedtime.

## 2018-10-20 NOTE — Progress Notes (Signed)
Chief Complaint: Abdominal pain  HPI:    Aimee Figueroa is a 48 year old Hispanic female with a past medical history as listed below including fibromyalgia, known to Dr. Silverio Decamp, who presents clinic today with a complaint of abdominal pain.    Please see previous records, patient has had extensive work-up for left upper quadrant abdominal pain in the past.    EGD 11/25/2017 with empiric dilation of the esophagus, gastritis with hemorrhage and gastric polyps, positive for H. Pylori.    Office visit 02/16/2018 with continued left upper quadrant pain which sometimes radiated across her stomach and into her back.  At that time discussed that she had had extensive work-up in the past including CT, MRI, colonoscopy, EGD, GYN evaluation, ventral hysterectomy and BSO.  Was discussed she could have an element of musculoskeletal disease/costochondritis versus functional dyspepsia.  She was prescribed Dicyclomine 20 mg 4 times daily and was recommended she start FD guard 2 tabs twice a day for a month and then decrease from there.  Also discussed she should see her PCP regarding anxiety.    10/11/2018 office visit Dr. Silverio Decamp for anal burning.  Describd chronic irritable bowel syndrome and anal fissures.  At that time described intermittent fullness and pressure in the anorectal region but no longer severe pain or discomfort, passing small pellets of hard stool occasionally, 1-2 bowel movements a day, Benefiber once daily, occasional Dulcolax.  At that time MiraLAX 1 capful daily recommended titrated based on response as well as fiber 1 to 2 teaspoons twice daily and return in 3 months.     Today, patient presents to clinic and explains that she has continued with the left upper quadrant fullness and some gas.  Continues to describe this left upper quadrant discomfort as a shooting pain at times and other times a pinching pain sometimes rated as a 6-7/10 which can radiate through to her back, seems worse when  she is driving or sometimes after she has eaten.  Tells me that laying on this side also hurts.  This pain makes her feel "weak all over".  This is the same pain she has experienced for years.  Previously was on FD guard and Dicyclomine and thinks this helped.    Denies fever, chills, weight loss, nausea, vomiting, heartburn, reflux or symptoms that awaken her from sleep.   Past Medical History:  Diagnosis Date  . Abnormal Pap smear   . Allergy    seasonal  . Anxiety   . Depression   . Fibromyalgia   . Hyperlipemia   . Low blood pressure   . Pyloric ulcer associated with Helicobacter pylori, acute 05/22/2017   patient not sure if she has an ulcer - first dx  . SVD (spontaneous vaginal delivery)    x 4  . 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 CESAREAN SECTION    . COLONOSCOPY    . GYNECOLOGIC CRYOSURGERY    . LAPAROSCOPIC ASSISTED VAGINAL HYSTERECTOMY N/A 05/26/2017   Procedure: LAPAROSCOPIC ASSISTED VAGINAL HYSTERECTOMY;  Surgeon: [name], MD;  Location: [facility];  Service: Gynecology;  Laterality: N/A;  . LAPAROSCOPIC BILATERAL SALPINGECTOMY Bilateral 05/26/2017   Procedure: LAPAROSCOPIC BILATERAL SALPINGECTOMY;  Surgeon: [name], MD;  Location: [facility];  Service: Gynecology;  Laterality: Bilateral;  . UPPER GI ENDOSCOPY      Current Outpatient Medications  Medication Sig Dispense Refill  . atorvastatin (LIPITOR) 20 MG tablet Take 1 tablet (  20 mg total) by mouth daily. 30 tablet 3  . Cholecalciferol (VITAMIN D3) 2000 units TABS Take 2,000 Units by mouth daily.    . hydrocortisone cream 1 % Apply 1 application topically daily as needed.    . nortriptyline (PAMELOR) 50 MG capsule Take 1 capsule (50 mg total) by mouth at bedtime. 90 capsule 2  . omega-3 acid ethyl esters (LOVAZA) 1 g capsule Take by mouth 2 (two) times daily.    Marland Kitchen omeprazole (PRILOSEC) 40 MG capsule Take 1 capsule (40 mg total) by mouth daily before  breakfast. 30 capsule 6   No current facility-administered medications for this visit.     Allergies as of 10/20/2018 - Review Complete 10/13/2018  Allergen Reaction Noted  . Tylenol [acetaminophen] Rash 09/17/2011    Family History  Problem Relation Age of Onset  . Diabetes Father   . Hyperlipidemia Father   . Hyperlipidemia Mother   . Uterine cancer Paternal Aunt   . Ulcerative colitis Cousin   . Anesthesia problems Neg Hx   . Colon cancer Neg Hx   . Rectal cancer Neg Hx   . Esophageal cancer Neg Hx   . Liver cancer Neg Hx     Social History   Socioeconomic History  . Marital status: Married    Spouse name: Not on file  . Number of children: 5  . Years of education: Not on file  . Highest education level: Not on file  Occupational History  . Occupation: homemaker  Social Needs  . Financial resource strain: Not on file  . Food insecurity:    Worry: Not on file    Inability: Not on file  . Transportation needs:    Medical: Not on file    Non-medical: Not on file  Tobacco Use  . Smoking status: Never Smoker  . Smokeless tobacco: Never Used  Substance and Sexual Activity  . Alcohol use: No  . Drug use: No  . Sexual activity: Yes    Partners: Male    Birth control/protection: Injection  Lifestyle  . Physical activity:    Days per week: Not on file    Minutes per session: Not on file  . Stress: Not on file  Relationships  . Social connections:    Talks on phone: Not on file    Gets together: Not on file    Attends religious service: Not on file    Active member of club or organization: Not on file    Attends meetings of clubs or organizations: Not on file    Relationship status: Not on file  . Intimate partner violence:    Fear of current or ex partner: Not on file    Emotionally abused: Not on file    Physically abused: Not on file    Forced sexual activity: Not on file  Other Topics Concern  . Not on file  Social History Narrative  . Not on file      Review of Systems:    Constitutional: No weight loss, fever or chills Cardiovascular: No chest pain Respiratory: No SOB  Gastrointestinal: See HPI and otherwise negative   Physical Exam:  Vital signs: BP 110/60   Pulse 68   Ht 5\' 1"  (1.549 m)   Wt 149 lb (67.6 kg)   LMP 05/01/2017   BMI 28.15 kg/m   Constitutional:   Pleasant Hispanic female appears to be in NAD, Well developed, Well nourished, alert and cooperative  Respiratory: Respirations even and unlabored. Lungs clear  to auscultation bilaterally.   No wheezes, crackles, or rhonchi.  Cardiovascular: Normal S1, S2. No MRG. Regular rate and rhythm. No peripheral edema, cyanosis or pallor.  Gastrointestinal:  Soft, nondistended, mild LUQ ttp. No rebound or guarding. Normal bowel sounds. No appreciable masses or hepatomegaly. Psychiatric: Demonstrates good judgement and reason without abnormal affect or behaviors.  No recent labs or imaging.  Assessment: 1.  Chronic left upper quadrant abdominal pain: Again thought likely functional and possibly related dyspepsia +/- constipation and gas, complete workup in the past 2.  Gas 3.  Constipation  Plan: 1.  Refilled patient's Dicyclomine 10 mg 4 times daily, 20-30 minutes before meals and at bedtime #120 with 3 refills 2.  Recommend patient restart FD Guard at 2 tabs twice daily for a month and then decrease to 1 tab twice daily, provided her with a coupon and samples. 3.  Continue MiraLAX and fiber per recommendations from Dr. Silverio Decamp. 4.  Patient to follow in clinic with Dr. Silverio Decamp or myself as needed in the future.  Ellouise Newer, PA-C [facility] 10/20/2018, 8:26 AM  Cc: Gildardo Pounds, NP

## 2018-10-22 NOTE — Progress Notes (Signed)
Reviewed and agree with documentation and assessment and plan. K. Veena Nandigam , MD   

## 2018-10-26 NOTE — Progress Notes (Deleted)
NEUROLOGY CONSULTATION NOTE  Aimee Figueroa MRN: [id number] DOB: ***  Referring provider: *** Primary care provider: ***  Reason for consult:  ***  HISTORY OF PRESENT ILLNESS: Aimee Figueroa is a 48 year old ***-handed woman with fibromyalgia, depression, and anxiety who presents for headaches.  History supplemented by referring provider note.  Onset:  *** Location:  *** Quality:  *** Intensity:  ***.  *** denies new headache, thunderclap headache or severe headache that wakes *** from sleep. Aura:  *** Prodrome:  *** Postdrome:  *** Associated symptoms:  ***.  *** denies associated unilateral numbness or weakness. Duration:  *** Frequency:  *** Frequency of abortive medication: *** Triggers:  *** Exacerbating factors:  *** Relieving factors:  *** Activity:  ***  Current NSAIDS:  *** Current analgesics:  *** Current triptans:  *** Current ergotamine:  *** Current anti-emetic:  *** Current muscle relaxants:  *** Current anti-anxiolytic:  *** Current sleep aide:  *** Current Antihypertensive medications:  *** Current Antidepressant medications:  Nortriptyline 50mg  at bedtime Current Anticonvulsant medications:  *** Current anti-CGRP:  *** Current Vitamins/Herbal/Supplements:  *** Current Antihistamines/Decongestants:  *** Other therapy:  *** Other medication:  ***  Past NSAIDS:  *** Past analgesics:  *** Past abortive triptans:  *** Past abortive ergotamine:  *** Past muscle relaxants:  *** Past anti-emetic:  *** Past antihypertensive medications:  *** Past antidepressant medications:  fluoxetine Past anticonvulsant medications:  *** Past anti-CGRP:  *** Past vitamins/Herbal/Supplements:  *** Past antihistamines/decongestants:  *** Other past therapies:  ***  Caffeine:  *** Alcohol:  *** Smoker:  *** Diet:  *** Exercise:  *** Depression:  ***; Anxiety:  *** Other pain:  *** Sleep hygiene:  *** Family history of  headache:  ***  Labs from September, including CBC, BMP, ANA, RF, Sed Rate, were unremarkable.  PAST MEDICAL HISTORY: Past Medical History:  Diagnosis Date  . Abnormal Pap smear   . Allergy    seasonal  . Anxiety   . Depression   . Fibromyalgia   . Hyperlipemia   . Low blood pressure   . Pyloric ulcer associated with Helicobacter pylori, acute 05/22/2017   patient not sure if she has an ulcer - first dx  . SVD (spontaneous vaginal delivery)    x 4  . 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 CESAREAN SECTION    . COLONOSCOPY    . GYNECOLOGIC CRYOSURGERY    . LAPAROSCOPIC ASSISTED VAGINAL HYSTERECTOMY N/A 05/26/2017   Procedure: LAPAROSCOPIC ASSISTED VAGINAL HYSTERECTOMY;  Surgeon: [name], MD;  Location: [facility];  Service: Gynecology;  Laterality: N/A;  . LAPAROSCOPIC BILATERAL SALPINGECTOMY Bilateral 05/26/2017   Procedure: LAPAROSCOPIC BILATERAL SALPINGECTOMY;  Surgeon: [name], MD;  Location: [facility];  Service: Gynecology;  Laterality: Bilateral;  . UPPER GI ENDOSCOPY      MEDICATIONS: Current Outpatient Medications on File Prior to Visit  Medication Sig Dispense Refill  . atorvastatin (LIPITOR) 20 MG tablet Take 1 tablet (20 mg total) by mouth daily. 30 tablet 3  . Cholecalciferol (VITAMIN D3) 2000 units TABS Take 2,000 Units by mouth daily.    Marland Kitchen dicyclomine (BENTYL) 10 MG capsule Take 1 capsule (10 mg total) by mouth 4 (four) times daily -  before meals and at bedtime. 120 capsule 3  . hydrocortisone cream 1 % Apply 1 application topically daily as needed.    . nortriptyline (PAMELOR) 50  MG capsule Take 50 mg by mouth at bedtime.    Marland Kitchen omega-3 acid ethyl esters (LOVAZA) 1 g capsule Take by mouth 2 (two) times daily.    Marland Kitchen omeprazole (PRILOSEC) 40 MG capsule Take 1 capsule (40 mg total) by mouth daily before breakfast. 30 capsule 6   No current facility-administered medications on file  prior to visit.     ALLERGIES: Allergies  Allergen Reactions  . Tylenol [Acetaminophen] Rash    FAMILY HISTORY: Family History  Problem Relation Age of Onset  . Diabetes Father   . Hyperlipidemia Father   . Hyperlipidemia Mother   . Uterine cancer Paternal Aunt   . Ulcerative colitis Cousin   . Anesthesia problems Neg Hx   . Colon cancer Neg Hx   . Rectal cancer Neg Hx   . Esophageal cancer Neg Hx   . Liver cancer Neg Hx    ***.  SOCIAL HISTORY: Social History   Socioeconomic History  . Marital status: Married    Spouse name: Not on file  . Number of children: 5  . Years of education: Not on file  . Highest education level: Not on file  Occupational History  . Occupation: homemaker  Social Needs  . Financial resource strain: Not on file  . Food insecurity:    Worry: Not on file    Inability: Not on file  . Transportation needs:    Medical: Not on file    Non-medical: Not on file  Tobacco Use  . Smoking status: Never Smoker  . Smokeless tobacco: Never Used  Substance and Sexual Activity  . Alcohol use: No  . Drug use: No  . Sexual activity: Yes    Partners: Male    Birth control/protection: Injection  Lifestyle  . Physical activity:    Days per week: Not on file    Minutes per session: Not on file  . Stress: Not on file  Relationships  . Social connections:    Talks on phone: Not on file    Gets together: Not on file    Attends religious service: Not on file    Active member of club or organization: Not on file    Attends meetings of clubs or organizations: Not on file    Relationship status: Not on file  . Intimate partner violence:    Fear of current or ex partner: Not on file    Emotionally abused: Not on file    Physically abused: Not on file    Forced sexual activity: Not on file  Other Topics Concern  . Not on file  Social History Narrative  . Not on file    REVIEW OF SYSTEMS: Constitutional: No fevers, chills, or sweats, no  generalized fatigue, change in appetite Eyes: No visual changes, double vision, eye pain Ear, nose and throat: No hearing loss, ear pain, nasal congestion, sore throat Cardiovascular: No chest pain, palpitations Respiratory:  No shortness of breath at rest or with exertion, wheezes GastrointestinaI: No nausea, vomiting, diarrhea, abdominal pain, fecal incontinence Genitourinary:  No dysuria, urinary retention or frequency Musculoskeletal:  No neck pain, back pain Integumentary: No rash, pruritus, skin lesions Neurological: as above Psychiatric: No depression, insomnia, anxiety Endocrine: No palpitations, fatigue, diaphoresis, mood swings, change in appetite, change in weight, increased thirst Hematologic/Lymphatic:  No purpura, petechiae. Allergic/Immunologic: no itchy/runny eyes, nasal congestion, recent allergic reactions, rashes  PHYSICAL EXAM: *** General: No acute distress.  Patient appears ***-groomed.  *** Head:  Normocephalic/atraumatic Eyes:  fundi examined  but not visualized Neck: supple, no paraspinal tenderness, full range of motion Back: No paraspinal tenderness Heart: regular rate and rhythm Lungs: Clear to auscultation bilaterally. Vascular: No carotid bruits. Neurological Exam: Mental status: alert and oriented to person, place, and time, recent and remote memory intact, fund of knowledge intact, attention and concentration intact, speech fluent and not dysarthric, language intact. Cranial nerves: CN I: not tested CN II: pupils equal, round and reactive to light, visual fields intact CN III, IV, VI:  full range of motion, no nystagmus, no ptosis CN V: facial sensation intact CN VII: upper and lower face symmetric CN VIII: hearing intact CN IX, X: gag intact, uvula midline CN XI: sternocleidomastoid and trapezius muscles intact CN XII: tongue midline Bulk & Tone: normal, no fasciculations. Motor:  5/5 throughout *** Sensation:  Pinprick *** temperature *** and  vibration sensation intact.  ***. Deep Tendon Reflexes:  2+ throughout, *** toes downgoing.  *** Finger to nose testing:  Without dysmetria.  *** Heel to shin:  Without dysmetria.  *** Gait:  Normal station and stride.  Able to turn and tandem walk. Romberg ***.  IMPRESSION: ***  PLAN: ***  Thank you for allowing me to take part in the care of this patient.  Metta Clines, DO  CC: ***

## 2018-10-27 ENCOUNTER — Ambulatory Visit: Payer: Self-pay | Admitting: Neurology

## 2018-11-02 ENCOUNTER — Encounter: Payer: Self-pay | Admitting: Nurse Practitioner

## 2018-11-02 ENCOUNTER — Ambulatory Visit: Payer: Self-pay | Attending: Nurse Practitioner | Admitting: Nurse Practitioner

## 2018-11-02 VITALS — BP 108/71 | HR 78 | Temp 99.2°F | Ht 61.0 in | Wt 150.0 lb

## 2018-11-02 DIAGNOSIS — F32A Depression, unspecified: Secondary | ICD-10-CM

## 2018-11-02 DIAGNOSIS — F329 Major depressive disorder, single episode, unspecified: Secondary | ICD-10-CM

## 2018-11-02 DIAGNOSIS — F419 Anxiety disorder, unspecified: Secondary | ICD-10-CM

## 2018-11-02 DIAGNOSIS — F321 Major depressive disorder, single episode, moderate: Secondary | ICD-10-CM

## 2018-11-02 MED ORDER — DULOXETINE HCL 20 MG PO CPEP: 20.0000 mg | ORAL_CAPSULE | Freq: Every day | ORAL | 3 refills | Status: DC

## 2018-11-02 MED ORDER — DULOXETINE HCL 30 MG PO CPEP: 30.0000 mg | ORAL_CAPSULE | Freq: Every day | ORAL | 3 refills | Status: DC

## 2018-11-02 NOTE — Patient Instructions (Signed)
Behavioral Health Resources:  ? ?What if I or someone I know is in crisis? ? ?If you are thinking about harming yourself or having thoughts of suicide, or if you know someone who is, seek help right away. ? ?Call your doctor or mental health care provider. ? ?Call 911 or go to a hospital emergency room to get immediate help, or ask a friend or family member to help you do these things. ? ?Call the [place] National Suicide Prevention Lifeline?s toll-free, 24-hour hotline at 1-800-273-TALK (1-800-273-8255) or TTY: 1-800-799-4 TTY (1-800-799-4889) to talk to a trained counselor. ? ?If you are in crisis, make sure you are not left alone.  ? ?If someone else is in crisis, make sure he or she is not left alone ? ? ?24 Hour Availability ? ?[facility]  ?[address], [place], [place] [address]  ?336-832-9700 or 1-800-711-2635 ? ?[facility] Crisis Line ?(Domestic Violence, Rape & Victim Assistance ?336-273-7273 ? ?[facility]  ?[address] [place], [place]  [address]               1-855-788-8787 or 336-676-6840 ? ?[facility] [place] [facility]    ?(ONLY from 8am-4pm)    ?336-899-1505 ? ?[facility] (24/7)   ?1-877-626-1772 ? ?[place] National Suicide Hotline   ?1-800-273-8255 (TALK) ? ?Support from local police to aid getting patient to hospital (http://www.-[place].gov/index.aspx?page=2797) ? ? ?      ?ONGOING BEHAVIORAL HEALTH SUPPORT FOR UNINSURED and UNDERINSURED:  ?[facility]  ?336-676-6840  ?[address]  ?Walk-in first time, Monday-Friday, 8:30am-5:00pm  ?*Bring snack, drink, something to do, long wait at first visit, they do have pharmacy for behavioral health medications/ Bring own interpreter at first visit, if needed ?[facility]  ?336-387-6161  ?[address]  ?Walk-in Monday-Friday, 8:30am-12pm & 1-2:30pm  ?*pacientes que hablen espanol, favor comunicarse con el Sr.  Mondragon, extension 2244 o la [address], extension 3331 para hacer una cita  ?[facility]:  ?336-429-5600 or kellinfoundation@gmail.com  ?[address], [address]  ?Call or email, may self-refer  ?* uninsured/underinsured, 19-64yo, have both mental health and substance use challenges  ?[facility]:  ?Phone (336) [phone]; Fax [phone]) [phone]  ?*Call to schedule an appointment  ?3rd Floor located @?[address], [address]  ?Mon-Thursday: 8:30am-8:00pm Friday: 8:30am-7:00pm  ?* Be sure to park in a space labeled ?Psychology Department,? located to the right of the main door of the building. Enter the main doors facing the parking lot and take the elevator or stairs to the 3rd Floor.  ?[facility]:  ?336-832-9700 or  ?1-800-711-2635 (24/hour helpline)  ?[address]  ?Call to make appointment, tends to be a long wait to begin services, depending on insurance  ?[facility]  ?(336) 333-6860 ??  ?*Call to schedule an appointment  ?[address]  ?Monday-Friday, 8:00am-5:00pm  ?[facility]  ?([phone]  ?[address], [place]  ?Monday-Friday, walk-in 8am-3pm  ?First appointment is assessment, then will make appointment for psychiatry   ?  ?

## 2018-11-02 NOTE — Progress Notes (Signed)
Assessment & Plan:  Aimee Figueroa was seen today for follow-up and flank pain.  Diagnoses and all orders for this visit:  Anxiety and depression -     DULoxetine (CYMBALTA) 30 MG capsule; Take 1 capsule (30 mg total) by mouth daily for 30 days.   Patient has been counseled on age-appropriate routine health concerns for screening and prevention. These are reviewed and up-to-date. Referrals have been placed accordingly. Immunizations are up-to-date or declined.    Subjective:   Chief Complaint  Patient presents with  . Follow-up    Pt. stated her headache is is better. Pt. would like PCP to check the bumps in her mouth.  . Flank Pain    Pt. stated her left side and top of her abdominal is sore.    HPI Aimee Figueroa - Aimee Figueroa 48 y.o. female presents to office today for follow up to anxiety and depression.  Anxiety and Depression She is very tearful today. States she continues to blame herself for an abortion she had many years ago that she never told her husband about. She endorses feelings of self doubt, insecurity and worries that something will happen to her children if she is not always around them. I have encouraged her to seek out counseling in addition to continuing the antidepressants that I have prescribed for her. She declines referral to the onsite SW today. Will add cymbalta 30mg  today. She is currently taking pamelor 50mg  QHS. She denies any current thoughts of self harm.  Depression screen [facility] 2/9 11/02/2018 10/05/2018 07/30/2018 06/09/2018 06/03/2018  Decreased Interest 1 1 1 1 1   Down, Depressed, Hopeless 1 1 1 1 1   PHQ - 2 Score 2 2 2 2 2   Altered sleeping 0 0 1 0 0  Tired, decreased energy 1 1 1 1 2   Change in appetite 0 0 0 0 0  Feeling bad or failure about yourself  2 1 1 1 1   Trouble concentrating 1 1 1  - 1  Moving slowly or fidgety/restless 1 0 0 0 1  Suicidal thoughts 1 1 1  0 0  PHQ-9 Score 8 6 7 4 7   Some recent data might be hidden    Review of Systems    Constitutional: Negative for fever, malaise/fatigue and weight loss.  HENT: Negative.  Negative for nosebleeds.   Eyes: Negative.  Negative for blurred vision, double vision and photophobia.  Respiratory: Negative.  Negative for cough and shortness of breath.   Cardiovascular: Negative.  Negative for chest pain, palpitations and leg swelling.  Gastrointestinal: Negative.  Negative for heartburn, nausea and vomiting.  Musculoskeletal: Negative.  Negative for myalgias.  Neurological: Negative.  Negative for dizziness, focal weakness, seizures and headaches.  Psychiatric/Behavioral: Positive for depression. Negative for suicidal ideas. The patient is nervous/anxious.     Past Medical History:  Diagnosis Date  . Abnormal Pap smear   . Allergy    seasonal  . Anxiety   . Depression   . Fibromyalgia   . Hyperlipemia   . Low blood pressure   . Pyloric ulcer associated with Helicobacter pylori, acute 05/22/2017   patient not sure if she has an ulcer - first dx  . SVD (spontaneous vaginal delivery)    x 4  . 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 CESAREAN SECTION    . COLONOSCOPY    . GYNECOLOGIC CRYOSURGERY    . LAPAROSCOPIC ASSISTED VAGINAL HYSTERECTOMY N/A  05/26/2017   Procedure: LAPAROSCOPIC ASSISTED VAGINAL HYSTERECTOMY;  Surgeon: [name], MD;  Location: [facility];  Service: Gynecology;  Laterality: N/A;  . LAPAROSCOPIC BILATERAL SALPINGECTOMY Bilateral 05/26/2017   Procedure: LAPAROSCOPIC BILATERAL SALPINGECTOMY;  Surgeon: [name], MD;  Location: [facility];  Service: Gynecology;  Laterality: Bilateral;  . UPPER GI ENDOSCOPY      Family History  Problem Relation Age of Onset  . Diabetes Father   . Hyperlipidemia Father   . Hyperlipidemia Mother   . Uterine cancer Paternal Aunt   . Ulcerative colitis Cousin   . Anesthesia problems Neg Hx   . Colon cancer Neg Hx   . Rectal cancer Neg Hx   . Esophageal cancer Neg  Hx   . Liver cancer Neg Hx     Social History Reviewed with no changes to be made today.   Outpatient Medications Prior to Visit  Medication Sig Dispense Refill  . atorvastatin (LIPITOR) 20 MG tablet Take 1 tablet (20 mg total) by mouth daily. 30 tablet 3  . Cholecalciferol (VITAMIN D3) 2000 units TABS Take 2,000 Units by mouth daily.    Marland Kitchen dicyclomine (BENTYL) 10 MG capsule Take 1 capsule (10 mg total) by mouth 4 (four) times daily -  before meals and at bedtime. 120 capsule 3  . nortriptyline (PAMELOR) 50 MG capsule Take 50 mg by mouth at bedtime.    Marland Kitchen omega-3 acid ethyl esters (LOVAZA) 1 g capsule Take by mouth 2 (two) times daily.    Marland Kitchen omeprazole (PRILOSEC) 40 MG capsule Take 1 capsule (40 mg total) by mouth daily before breakfast. 30 capsule 6   No facility-administered medications prior to visit.     Allergies  Allergen Reactions  . Tylenol [Acetaminophen] Rash       Objective:    BP 108/71 (BP Location: Left Arm, Patient Position: Sitting, Cuff Size: Normal)   Pulse 78   Temp 99.2 F (37.3 C) (Oral)   Ht 5\' 1"  (1.549 m)   Wt 150 lb (68 kg)   LMP 05/01/2017   SpO2 96%   BMI 28.34 kg/m  Wt Readings from Last 3 Encounters:  11/02/18 150 lb (68 kg)  10/20/18 149 lb (67.6 kg)  10/11/18 151 lb 8 oz (68.7 kg)    Physical Exam Vitals signs and nursing note reviewed.  Constitutional:      Appearance: She is well-developed.  HENT:     Head: Normocephalic and atraumatic.  Neck:     Musculoskeletal: Normal range of motion.  Cardiovascular:     Rate and Rhythm: Normal rate and regular rhythm.     Heart sounds: Normal heart sounds. No murmur. No friction rub. No gallop.   Pulmonary:     Effort: Pulmonary effort is normal. No tachypnea or respiratory distress.     Breath sounds: Normal breath sounds. No decreased breath sounds, wheezing, rhonchi or rales.  Chest:     Chest wall: No tenderness.  Abdominal:     General: Bowel sounds are normal.     Palpations: Abdomen  is soft.  Musculoskeletal: Normal range of motion.  Skin:    General: Skin is warm and dry.  Neurological:     Mental Status: She is alert and oriented to person, place, and time.     Coordination: Coordination normal.  Psychiatric:        Attention and Perception: Attention normal.        Mood and Affect: Mood is anxious and depressed. Affect is tearful.  Speech: Speech normal.        Behavior: Behavior normal. Behavior is cooperative.        Thought Content: Thought content normal. Thought content does not include homicidal or suicidal ideation. Thought content does not include homicidal or suicidal plan.        Cognition and Memory: Cognition and memory normal.        Judgment: Judgment normal.          Patient has been counseled extensively about nutrition and exercise as well as the importance of adherence with medications and regular follow-up. The patient was given clear instructions to go to ER or return to medical center if symptoms don't improve, worsen or new problems develop. The patient verbalized understanding.   Follow-up: Return in about 3 weeks (around 11/23/2018) for abdominal pain; depressoin.   Gildardo Pounds, FNP-BC [facility] and [facility], [place]   11/04/2018, 12:14 PM

## 2018-11-04 ENCOUNTER — Encounter: Payer: Self-pay | Admitting: Nurse Practitioner

## 2018-12-07 ENCOUNTER — Ambulatory Visit: Payer: Self-pay | Attending: Nurse Practitioner | Admitting: Nurse Practitioner

## 2018-12-07 ENCOUNTER — Encounter: Payer: Self-pay | Admitting: Nurse Practitioner

## 2018-12-07 VITALS — BP 105/66 | HR 72 | Temp 98.7°F | Ht 61.0 in | Wt 146.0 lb

## 2018-12-07 DIAGNOSIS — F329 Major depressive disorder, single episode, unspecified: Secondary | ICD-10-CM

## 2018-12-07 DIAGNOSIS — R7303 Prediabetes: Secondary | ICD-10-CM

## 2018-12-07 DIAGNOSIS — E782 Mixed hyperlipidemia: Secondary | ICD-10-CM

## 2018-12-07 DIAGNOSIS — E781 Pure hyperglyceridemia: Secondary | ICD-10-CM

## 2018-12-07 DIAGNOSIS — F419 Anxiety disorder, unspecified: Secondary | ICD-10-CM

## 2018-12-07 DIAGNOSIS — K219 Gastro-esophageal reflux disease without esophagitis: Secondary | ICD-10-CM

## 2018-12-07 LAB — GLUCOSE, POCT (MANUAL RESULT ENTRY): POC Glucose: 108 mg/dl — AB (ref 70–99)

## 2018-12-07 LAB — POCT GLYCOSYLATED HEMOGLOBIN (HGB A1C): Hemoglobin A1C: 5.2 % (ref 4.0–5.6)

## 2018-12-07 MED ORDER — OMEPRAZOLE 40 MG PO CPDR: 40.0000 mg | DELAYED_RELEASE_CAPSULE | Freq: Every day | ORAL | 1 refills | Status: DC

## 2018-12-07 MED ORDER — NORTRIPTYLINE HCL 50 MG PO CAPS: 50.0000 mg | ORAL_CAPSULE | Freq: Every day | ORAL | 0 refills | Status: DC

## 2018-12-07 MED ORDER — ATORVASTATIN CALCIUM 20 MG PO TABS: 20.0000 mg | ORAL_TABLET | Freq: Every day | ORAL | 3 refills | Status: AC

## 2018-12-07 MED FILL — NORTRIPTYLINE HCL 50 MG CAP: 50 | 30 days supply | Qty: 30 | Fill #0

## 2018-12-07 MED FILL — OMEPRAZOLE DR 40 MG CAPSULE: 40 | 30 days supply | Qty: 30 | Fill #0

## 2018-12-07 MED FILL — ATORVASTATIN 20 MG TABLET: 20 | 30 days supply | Qty: 30 | Fill #0

## 2018-12-07 NOTE — Progress Notes (Signed)
Assessment & Plan:  Aimee Figueroa was seen today for follow-up.  Diagnoses and all orders for this visit:  Anxiety and depression -     nortriptyline (PAMELOR) 50 MG capsule; Take 1 capsule (50 mg total) by mouth at bedtime.  Prediabetes -     Glucose (CBG) -     HgB A1c -     CBC -     CMP14+EGFR  Mixed hyperlipidemia -     atorvastatin (LIPITOR) 20 MG tablet; Take 1 tablet (20 mg total) by mouth daily. -     Lipid panel INSTRUCTIONS: Work on a low fat, heart healthy diet and participate in regular aerobic exercise program by working out at least 150 minutes per week; 5 days a week-30 minutes per day. Avoid red meat, fried foods. junk foods, sodas, sugary drinks, unhealthy snacking, alcohol and smoking.  Drink at least 48oz of water per day and monitor your carbohydrate intake daily.    High triglycerides -     atorvastatin (LIPITOR) 20 MG tablet; Take 1 tablet (20 mg total) by mouth daily. -     Lipid panel INSTRUCTIONS: Work on a low fat, heart healthy diet and participate in regular aerobic exercise program by working out at least 150 minutes per week; 5 days a week-30 minutes per day. Avoid red meat, fried foods. junk foods, sodas, sugary drinks, unhealthy snacking, alcohol and smoking.  Drink at least 48oz of water per day and monitor your carbohydrate intake daily.    Gastroesophageal reflux disease, esophagitis presence not specified -     omeprazole (PRILOSEC) 40 MG capsule; Take 1 capsule (40 mg total) by mouth daily before breakfast. INSTRUCTIONS: Avoid GERD Triggers: acidic, spicy or fried foods, caffeine, coffee, sodas,  alcohol and chocolate.     Patient has been counseled on age-appropriate routine health concerns for screening and prevention. These are reviewed and up-to-date. Referrals have been placed accordingly. Immunizations are up-to-date or declined.    Subjective:   Chief Complaint  Patient presents with  . Follow-up    Pt. is here to follow-up on  abdominal pain and depression. Pt. stated she still have abdominals.    HPI Aimee Figueroa 48 y.o. female presents to office today for follow up to depression, anxiety and abdominal pain. She notices her abdmoinal pain worse with stress. Will refill her PPI. She has been trying therapeutic measures at home to help relieve her anxiety and depression: working out/exercising,  Meditation Deep breathing and states she has been feeling much better over the past few weeks. States she will be traveling in a few weeks to visit her relatives and she is looking forward to this. PHQ9 score has improved. She denies any thoughts of self harm.  Depression screen [facility] 2/9 12/07/2018 11/02/2018 10/05/2018 07/30/2018 06/09/2018  Decreased Interest 1 1 1 1 1   Down, Depressed, Hopeless 1 1 1 1 1   PHQ - 2 Score 2 2 2 2 2   Altered sleeping 0 0 0 1 0  Tired, decreased energy 1 1 1 1 1   Change in appetite 0 0 0 0 0  Feeling bad or failure about yourself  1 2 1 1 1   Trouble concentrating 0 1 1 1  -  Moving slowly or fidgety/restless 0 1 0 0 0  Suicidal thoughts 1 1 1 1  0  PHQ-9 Score 5 8 6 7 4   Some recent data might be hidden   GAD 7 : Generalized Anxiety Score 12/07/2018 11/02/2018 10/05/2018  06/03/2018  Nervous, Anxious, on Edge 2 3 1  0  Control/stop worrying 2 2 2  0  Worry too much - different things 2 3 1  0  Trouble relaxing 1 - 1 0  Restless 1 - 0 0  Easily annoyed or irritable 1 - 0 0  Afraid - awful might happen 2 - 2 0  Total GAD 7 Score 11 - 7 0   Prediabetes Well controlled with diet and exercise. She denies any complications.  Lab Results  Component Value Date   HGBA1C 5.2 12/07/2018   Hyperlipidemia Patient presents for follow up to hyperlipidemia.  She states she is  medication compliant however triglycerides remain high. She is not diet compliant and denies chest pain, exertional chest pressure/discomfort and lower extremity edema or statin intolerance including myalgias.  Lab Results    Component Value Date   CHOL 168 12/07/2018   Lab Results  Component Value Date   HDL 38 (L) 12/07/2018   Lab Results  Component Value Date   LDLCALC 51 12/07/2018   Lab Results  Component Value Date   TRIG 397 (H) 12/07/2018   Lab Results  Component Value Date   CHOLHDL 4.4 12/07/2018   \GERD: Patient complains of increased abdominal pain with stress levels. This has been associated with burning sensation in abdomen.  She denies choking on food, difficulty swallowing, hematemesis and melena.She denies dysphagia.  She denies melena, hematochezia, hematemesis, and coffee ground emesis. Medical therapy in the past has included proton pump inhibitors which provide relief of symptoms.  Review of Systems  Constitutional: Negative for fever, malaise/fatigue and weight loss.  HENT: Negative.  Negative for nosebleeds.   Eyes: Negative.  Negative for blurred vision, double vision and photophobia.  Respiratory: Negative.  Negative for cough and shortness of breath.   Cardiovascular: Negative.  Negative for chest pain, palpitations and leg swelling.  Gastrointestinal: Positive for heartburn. Negative for nausea and vomiting.  Musculoskeletal: Negative.  Negative for myalgias.  Neurological: Negative.  Negative for dizziness, focal weakness, seizures and headaches.  Endo/Heme/Allergies: Positive for environmental allergies.  Psychiatric/Behavioral: Positive for depression. Negative for suicidal ideas. The patient is nervous/anxious.     Past Medical History:  Diagnosis Date  . Abnormal Pap smear   . Allergy    seasonal  . Anxiety   . Depression   . Fibromyalgia   . Hyperlipemia   . Low blood pressure   . Pyloric ulcer associated with Helicobacter pylori, acute 05/22/2017   patient not sure if she has an ulcer - first dx  . SVD (spontaneous vaginal delivery)    x 4  . 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 CESAREAN SECTION    .  COLONOSCOPY    . GYNECOLOGIC CRYOSURGERY    . LAPAROSCOPIC ASSISTED VAGINAL HYSTERECTOMY N/A 05/26/2017   Procedure: LAPAROSCOPIC ASSISTED VAGINAL HYSTERECTOMY;  Surgeon: [name], MD;  Location: [facility];  Service: Gynecology;  Laterality: N/A;  . LAPAROSCOPIC BILATERAL SALPINGECTOMY Bilateral 05/26/2017   Procedure: LAPAROSCOPIC BILATERAL SALPINGECTOMY;  Surgeon: [name], MD;  Location: [facility];  Service: Gynecology;  Laterality: Bilateral;  . UPPER GI ENDOSCOPY      Family History  Problem Relation Age of Onset  . Diabetes Father   . Hyperlipidemia Father   . Hyperlipidemia Mother   . Uterine cancer Paternal Aunt   . Ulcerative colitis Cousin   . Anesthesia problems Neg Hx   . Colon cancer  Neg Hx   . Rectal cancer Neg Hx   . Esophageal cancer Neg Hx   . Liver cancer Neg Hx     Social History Reviewed with no changes to be made today.   Outpatient Medications Prior to Visit  Medication Sig Dispense Refill  . Cholecalciferol (VITAMIN D3) 2000 units TABS Take 2,000 Units by mouth daily.    Marland Kitchen dicyclomine (BENTYL) 10 MG capsule Take 1 capsule (10 mg total) by mouth 4 (four) times daily -  before meals and at bedtime. 120 capsule 3  . omega-3 acid ethyl esters (LOVAZA) 1 g capsule Take by mouth 2 (two) times daily.    Marland Kitchen atorvastatin (LIPITOR) 20 MG tablet Take 1 tablet (20 mg total) by mouth daily. 30 tablet 3  . nortriptyline (PAMELOR) 50 MG capsule Take 50 mg by mouth at bedtime.    Marland Kitchen omeprazole (PRILOSEC) 40 MG capsule Take 1 capsule (40 mg total) by mouth daily before breakfast. 30 capsule 6  . DULoxetine (CYMBALTA) 30 MG capsule Take 1 capsule (30 mg total) by mouth daily for 30 days. 30 capsule 3   No facility-administered medications prior to visit.     Allergies  Allergen Reactions  . Tylenol [Acetaminophen] Rash       Objective:    BP 105/66 (BP Location: Left Arm, Patient Position: Sitting, Cuff Size: Normal)   Pulse 72   Temp 98.7 F (37.1 C)  (Oral)   Ht 5' 1"  (1.549 m)   Wt 146 lb (66.2 kg)   LMP 05/01/2017   SpO2 97%   BMI 27.59 kg/m  Wt Readings from Last 3 Encounters:  12/07/18 146 lb (66.2 kg)  11/02/18 150 lb (68 kg)  10/20/18 149 lb (67.6 kg)    Physical Exam Vitals signs and nursing note reviewed.  Constitutional:      Appearance: She is well-developed.  HENT:     Head: Normocephalic and atraumatic.  Neck:     Musculoskeletal: Normal range of motion.  Cardiovascular:     Rate and Rhythm: Normal rate and regular rhythm.     Heart sounds: Normal heart sounds. No murmur. No friction rub. No gallop.   Pulmonary:     Effort: Pulmonary effort is normal. No tachypnea or respiratory distress.     Breath sounds: Normal breath sounds. No decreased breath sounds, wheezing, rhonchi or rales.  Chest:     Chest wall: No tenderness.  Abdominal:     General: Bowel sounds are normal.     Palpations: Abdomen is soft.  Musculoskeletal: Normal range of motion.  Skin:    General: Skin is warm and dry.  Neurological:     Mental Status: She is alert and oriented to person, place, and time.     Coordination: Coordination normal.  Psychiatric:        Behavior: Behavior normal. Behavior is cooperative.        Thought Content: Thought content normal.        Judgment: Judgment normal.          Patient has been counseled extensively about nutrition and exercise as well as the importance of adherence with medications and regular follow-up. The patient was given clear instructions to go to ER or return to medical center if symptoms don't improve, worsen or new problems develop. The patient verbalized understanding.   Follow-up: Return in about 8 weeks (around 02/01/2019) for F/U.   Gildardo Pounds, FNP-BC [facility] and [facility], [place]  12/09/2018, 1:13 AM

## 2018-12-08 LAB — LIPID PANEL
Chol/HDL Ratio: 4.4 ratio (ref 0.0–4.4)
Cholesterol, Total: 168 mg/dL (ref 100–199)
HDL: 38 mg/dL — ABNORMAL LOW (ref >39)
LDL Calculated: 51 mg/dL (ref 0–99)
Triglycerides: 397 mg/dL — ABNORMAL HIGH (ref 0–149)
VLDL Cholesterol Cal: 79 mg/dL — ABNORMAL HIGH (ref 5–40)

## 2018-12-08 LAB — CMP14+EGFR
ALT: 49 IU/L — ABNORMAL HIGH (ref 0–32)
AST: 32 IU/L (ref 0–40)
Albumin/Globulin Ratio: 1.6 (ref 1.2–2.2)
Albumin: 4.5 g/dL (ref 3.8–4.8)
Alkaline Phosphatase: 54 IU/L (ref 39–117)
BUN/Creatinine Ratio: 18 (ref 9–23)
BUN: 11 mg/dL (ref 6–24)
Bilirubin Total: 0.4 mg/dL (ref 0.0–1.2)
CO2: 21 mmol/L (ref 20–29)
Calcium: 9.3 mg/dL (ref 8.7–10.2)
Chloride: 101 mmol/L (ref 96–106)
Creatinine, Ser: 0.6 mg/dL (ref 0.57–1.00)
GFR calc Af Amer: 126 mL/min/{1.73_m2} (ref >59)
GFR calc non Af Amer: 110 mL/min/{1.73_m2} (ref >59)
Globulin, Total: 2.9 g/dL (ref 1.5–4.5)
Glucose: 86 mg/dL (ref 65–99)
POTASSIUM: 3.9 mmol/L (ref 3.5–5.2)
Sodium: 140 mmol/L (ref 134–144)
Total Protein: 7.4 g/dL (ref 6.0–8.5)

## 2018-12-08 LAB — CBC
Hematocrit: 39.8 % (ref 34.0–46.6)
Hemoglobin: 13.8 g/dL (ref 11.1–15.9)
MCH: 31.8 pg (ref 26.6–33.0)
MCHC: 34.7 g/dL (ref 31.5–35.7)
MCV: 92 fL (ref 79–97)
Platelets: 176 10*3/uL (ref 150–450)
RBC: 4.34 x10E6/uL (ref 3.77–5.28)
RDW: 12.6 % (ref 11.7–15.4)
WBC: 4.8 10*3/uL (ref 3.4–10.8)

## 2018-12-09 ENCOUNTER — Encounter: Payer: Self-pay | Admitting: Nurse Practitioner

## 2018-12-09 MED ORDER — OMEGA-3-ACID ETHYL ESTERS 1 G PO CAPS: 2.0000 g | ORAL_CAPSULE | Freq: Two times a day (BID) | ORAL | 6 refills | Status: DC

## 2018-12-09 MED FILL — OMEGA-3 ETHYL ESTERS 1 GM C: 1 | 30 days supply | Qty: 120 | Fill #0

## 2018-12-10 ENCOUNTER — Telehealth: Payer: Self-pay

## 2018-12-10 NOTE — Telephone Encounter (Signed)
-----   Message from Gildardo Pounds, NP sent at 12/09/2018  1:27 AM EDT ----- Your cholesterol levels specifically your triglycerides are still high. WIll need to increase your omega 3. I have sent it to the pharmacy. You will also need to take your atorvastatin in the pm and omega 3 in the am.

## 2018-12-10 NOTE — Telephone Encounter (Signed)
CMA spoke to patient to inform results and new medication changes.  Pt. Verified DOB. Pt. Understood

## 2018-12-23 ENCOUNTER — Telehealth: Payer: Self-pay | Admitting: Gastroenterology

## 2018-12-23 NOTE — Telephone Encounter (Signed)
Pt called wanting to have a off visit she stated she is having a lot of stomach pain top and bottom with terrible rectal pressure.

## 2018-12-23 NOTE — Telephone Encounter (Signed)
Spoke with the patient. She is having the "empty in the middle of my stomach" feeling again. She admits to anxiety and is afraid of colon cancer.  She is having daily bowel movements. No straining . No blood. She is taking daily PPI. She will try the FDgard again.  Do you have any recommendations?

## 2018-12-24 NOTE — Telephone Encounter (Signed)
Patient advised.

## 2018-12-24 NOTE — Telephone Encounter (Signed)
It is stressful time, agree with continuing PPI and FD Donald Prose as needed TID. Offer Webex or Zoom visit if patient wants to have a visit with me soon. Thanks

## 2018-12-31 ENCOUNTER — Other Ambulatory Visit: Payer: Self-pay

## 2018-12-31 ENCOUNTER — Ambulatory Visit (INDEPENDENT_AMBULATORY_CARE_PROVIDER_SITE_OTHER): Payer: Self-pay | Admitting: Obstetrics & Gynecology

## 2018-12-31 ENCOUNTER — Encounter: Payer: Self-pay | Admitting: Obstetrics & Gynecology

## 2018-12-31 VITALS — BP 120/70

## 2018-12-31 DIAGNOSIS — Z8719 Personal history of other diseases of the digestive system: Secondary | ICD-10-CM

## 2018-12-31 DIAGNOSIS — Z90710 Acquired absence of both cervix and uterus: Secondary | ICD-10-CM

## 2018-12-31 DIAGNOSIS — R102 Pelvic and perineal pain: Secondary | ICD-10-CM

## 2018-12-31 NOTE — Progress Notes (Signed)
    Aimee Figueroa [birth year] [id number]        48 y.o.  [id number] married  RP: Low abdominal pain x 2 days  HPI: Lower abdominal pain for the last 2 days, crampy and radiating to the lower back.  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  History of IBS.  Feels better after passing a bowel movement.  No blood in stools.  No urinary tract infection symptoms.  No fever.    OB History  Gravida Para Term Preterm AB Living  7 5 5  0 2 5  SAB TAB Ectopic Multiple Live Births  2       5    # Outcome Date GA Lbr Len/2nd Weight Sex Delivery Anes PTL Lv  7 Term 03/11/12 [redacted]w[redacted]d 12:51 / 00:15 8 lb 7.1 oz (3.83 kg) M VBAC EPI  LIV     Birth Comments: Weakness in right arm  6 Term 10/01/06    F CS-LTranv EPI N LIV     Birth Comments: breech  5 Term 01/02/03    M Vag-Spont None N LIV  4 Term 09/16/00    M Vag-Spont None N LIV  3 Term 09/16/96    F Vag-Spont EPI N LIV  2 SAB           1 SAB             Past medical history,surgical history, problem list, medications, allergies, family history and social history were all reviewed and documented in the EPIC chart.   Directed ROS with pertinent positives and negatives documented in the history of present illness/assessment and plan.  Exam:  Vitals:   12/31/18 1126  BP: 120/70   General appearance:  Normal  Abdomen: Soft, not distended, no mass felt.  Gynecologic exam: Vulva normal.  Bimanual exam: No pelvic mass felt.  Nontender to palpation.  No colpocele or cystorectocele.   Assessment/Plan:  48 y.o. [id number]   1. Pelvic pain in female Pelvic cramping and lower abdomen, not lateralized.  No pelvic cyst or mass felt.  Suspect an intestinal origin of discomfort.  Patient has a diagnosis of IBS.  Decision to refer back to gastroenterologist for reevaluation.  2. History of LAVH  3. History of IBS Decision to refer back to gastroenterologist for evaluation has her pain is probably  intestinal in origin.  Counseling on above issues and coordination of care 50% of 25 minutes.  [name] MD, 11:30 AM 12/31/2018

## 2019-01-02 ENCOUNTER — Encounter: Payer: Self-pay | Admitting: Obstetrics & Gynecology

## 2019-01-02 NOTE — Patient Instructions (Signed)
1. Pelvic pain in female Pelvic cramping and lower abdomen, not lateralized.  No pelvic cyst or mass felt.  Suspect an intestinal origin of discomfort.  Patient has a diagnosis of IBS.  Decision to refer back to gastroenterologist for reevaluation.  2. History of LAVH  3. History of IBS Decision to refer back to gastroenterologist for evaluation has her pain is probably intestinal in origin.  Aimee Figueroa, fue un placer verle hoy!

## 2019-01-03 ENCOUNTER — Other Ambulatory Visit: Payer: Self-pay

## 2019-01-03 ENCOUNTER — Ambulatory Visit (INDEPENDENT_AMBULATORY_CARE_PROVIDER_SITE_OTHER): Payer: Self-pay | Admitting: Gastroenterology

## 2019-01-03 DIAGNOSIS — G8929 Other chronic pain: Secondary | ICD-10-CM

## 2019-01-03 DIAGNOSIS — K219 Gastro-esophageal reflux disease without esophagitis: Secondary | ICD-10-CM

## 2019-01-03 DIAGNOSIS — K582 Mixed irritable bowel syndrome: Secondary | ICD-10-CM

## 2019-01-03 DIAGNOSIS — R1032 Left lower quadrant pain: Secondary | ICD-10-CM

## 2019-01-03 DIAGNOSIS — R1013 Epigastric pain: Secondary | ICD-10-CM

## 2019-01-03 DIAGNOSIS — R140 Abdominal distension (gaseous): Secondary | ICD-10-CM

## 2019-01-03 DIAGNOSIS — R142 Eructation: Secondary | ICD-10-CM

## 2019-01-03 MED ORDER — DICYCLOMINE HCL 20 MG PO TABS: 20.0000 mg | ORAL_TABLET | Freq: Four times a day (QID) | ORAL | 3 refills | Status: DC

## 2019-01-03 NOTE — Progress Notes (Signed)
[address] [name]    [id number]    [birth year]  Primary Care Physician:Fleming, Vernia Buff, NP  Referring Physician: Gildardo Pounds, NP [place], [place] [id number]  This service was provided via telemedicine due to [place] 19.  Patient location: Home Provider location: Office Used 2 patient identifiers to confirm the correct person. Explained the limitations in evaluation and management via telemedicine. Patient is aware of potential medical charges for this visit.  Patient consented to this virtual visit (via telephone/webex).  The persons participating in this telemedicine service were myself and the patient   Chief complaint:  IBS, chronic abdominal pain  HPI: 48 year old female with history of chronic irritable bowel syndrome and chronic abdominal pain. Complains of bilateral lower abdominal pain radiating to the back like a band and also has pain in the left upper abdomen.  She saw her GYN last Friday, was told the pain was GI related and asked her to follow-up with [place] . Her bowel habits alternating constipation and diarrhea.  Some days she has loose bowel movements and on some days she passes small thin pieces of stool.  On most days she has at least 1 bowel movement. She has been taking ibuprofen and naproxen with some relief of lower abdominal pain radiating to the back. Denies any vomiting, dysphagia, decreased appetite or weight loss  She has had extensive GI work-up including EGD, colonoscopy, CT abdomen and pelvis.  Status post hysterectomy.  H. pylori gastritis status post confirmed eradication    Outpatient Encounter Medications as of 01/03/2019  Medication Sig  . atorvastatin (LIPITOR) 20 MG tablet Take 1 tablet (20 mg total) by mouth daily.  . Cholecalciferol (VITAMIN D3) 2000 units TABS Take 2,000 Units by mouth daily.  Marland Kitchen dicyclomine (BENTYL) 10 MG capsule Take 1 capsule (10 mg total) by mouth 4 (four) times daily -  before  meals and at bedtime.  . DULoxetine (CYMBALTA) 30 MG capsule Take 1 capsule (30 mg total) by mouth daily for 30 days.  . nortriptyline (PAMELOR) 50 MG capsule Take 1 capsule (50 mg total) by mouth at bedtime.  Marland Kitchen omega-3 acid ethyl esters (LOVAZA) 1 g capsule Take 2 capsules (2 g total) by mouth 2 (two) times daily for 30 days.  Marland Kitchen omeprazole (PRILOSEC) 40 MG capsule Take 1 capsule (40 mg total) by mouth daily before breakfast.   No facility-administered encounter medications on file as of 01/03/2019.     Allergies as of 01/03/2019 - Review Complete 01/02/2019  Allergen Reaction Noted  . Tylenol [acetaminophen] Rash 09/17/2011    Past Medical History:  Diagnosis Date  . Abnormal Pap smear   . Allergy    seasonal  . Anxiety   . Depression   . Fibromyalgia   . Hyperlipemia   . Low blood pressure   . Pyloric ulcer associated with Helicobacter pylori, acute 05/22/2017   patient not sure if she has an ulcer - first dx  . SVD (spontaneous vaginal delivery)    x 4  . 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 CESAREAN SECTION    . COLONOSCOPY    . GYNECOLOGIC CRYOSURGERY    . LAPAROSCOPIC ASSISTED VAGINAL HYSTERECTOMY N/A 05/26/2017   Procedure: LAPAROSCOPIC ASSISTED VAGINAL HYSTERECTOMY;  Surgeon: [name], MD;  Location: [facility];  Service: Gynecology;  Laterality: N/A;  . LAPAROSCOPIC BILATERAL SALPINGECTOMY Bilateral  05/26/2017   Procedure: LAPAROSCOPIC BILATERAL SALPINGECTOMY;  Surgeon: [name], MD;  Location: [facility];  Service: Gynecology;  Laterality: Bilateral;  . UPPER GI ENDOSCOPY      Family History  Problem Relation Age of Onset  . Diabetes Father   . Hyperlipidemia Father   . Hyperlipidemia Mother   . Uterine cancer Paternal Aunt   . Ulcerative colitis Cousin   . Anesthesia problems Neg Hx   . Colon cancer Neg Hx   . Rectal cancer Neg Hx   . Esophageal cancer Neg Hx   . Liver cancer Neg Hx     Social  History   Socioeconomic History  . Marital status: Married    Spouse name: Not on file  . Number of children: 5  . Years of education: Not on file  . Highest education level: Not on file  Occupational History  . Occupation: homemaker  Social Needs  . Financial resource strain: Not on file  . Food insecurity:    Worry: Not on file    Inability: Not on file  . Transportation needs:    Medical: Not on file    Non-medical: Not on file  Tobacco Use  . Smoking status: Never Smoker  . Smokeless tobacco: Never Used  Substance and Sexual Activity  . Alcohol use: No  . Drug use: No  . Sexual activity: Yes    Partners: Male    Birth control/protection: Injection  Lifestyle  . Physical activity:    Days per week: Not on file    Minutes per session: Not on file  . Stress: Not on file  Relationships  . Social connections:    Talks on phone: Not on file    Gets together: Not on file    Attends religious service: Not on file    Active member of club or organization: Not on file    Attends meetings of clubs or organizations: Not on file    Relationship status: Not on file  . Intimate partner violence:    Fear of current or ex partner: Not on file    Emotionally abused: Not on file    Physically abused: Not on file    Forced sexual activity: Not on file  Other Topics Concern  . Not on file  Social History Narrative  . Not on file      Review of systems: Review of Systems as per HPI All other systems reviewed and are negative.   Physical Exam: Vitals were not taken and physical exam was not performed during this virtual visit.  Data Reviewed:  Reviewed labs, radiology imaging, old records and pertinent past GI work up   Assessment and Plan/Recommendations:  48 year old female with chronic irritable bowel syndrome and functional abdominal pain associated with excessive bloating and belching  GERD: Continue PPI and antireflux measures  Bloating and excessive  belching: Simethicone 1 to 2 capsules 3 times daily with meals as needed  Small frequent meals  Trial of lactose-free diet  IBS with abdominal pain: Increase dicyclomine to 20 mg every 6 hours as needed  Constipation alternating with diarrhea: Start fiber supplement 1 teaspoon 2-3 times daily with meals  Limit intake of NSAIDs  Follow-up in office visit in 2 months or sooner if needed   K. Denzil Magnuson , MD   CC: Gildardo Pounds, NP

## 2019-01-03 NOTE — Patient Instructions (Addendum)
Increase Bentyl to 20 mg every 6 hours as needed, up to 4 times daily  Simethicone 1 to 2 capsules 3 times daily after meals as needed  Continue omeprazole   Patient advised to avoid spicy, acidic, citrus, chocolate, mints, fruit and fruit juices.  Limit the intake of caffeine, alcohol and Soda.  Don't exercise too soon after eating.  Don't lie down within 3-4 hours of eating.  Elevate the head of your bed.  Small frequent meals   fiber supplement (Metamucil, Benefiber or soluble fiber) 1 teaspoon 2-3 times daily with meals  Limit the intake of NSAIDs  Follow-up in office visit in 2 months

## 2019-01-04 ENCOUNTER — Telehealth: Payer: Self-pay | Admitting: Gastroenterology

## 2019-01-04 ENCOUNTER — Other Ambulatory Visit: Payer: Self-pay

## 2019-01-04 MED ORDER — DICYCLOMINE HCL 20 MG PO TABS: 20.0000 mg | ORAL_TABLET | Freq: Four times a day (QID) | ORAL | 3 refills | Status: DC

## 2019-01-04 NOTE — Telephone Encounter (Signed)
Pt requested prescription for dicyclomine to be sent to [facility] on [address].

## 2019-01-04 NOTE — Telephone Encounter (Signed)
Rx for dicyclomine sent to [facility] on [address] and [address].

## 2019-01-05 ENCOUNTER — Other Ambulatory Visit: Payer: Self-pay | Admitting: Anesthesiology

## 2019-01-05 ENCOUNTER — Encounter: Payer: Self-pay | Admitting: Gastroenterology

## 2019-01-05 DIAGNOSIS — R102 Pelvic and perineal pain: Secondary | ICD-10-CM

## 2019-01-10 ENCOUNTER — Other Ambulatory Visit: Payer: Self-pay

## 2019-01-11 ENCOUNTER — Other Ambulatory Visit: Payer: Self-pay | Admitting: *Deleted

## 2019-01-11 DIAGNOSIS — R102 Pelvic and perineal pain: Secondary | ICD-10-CM

## 2019-01-12 ENCOUNTER — Encounter: Payer: Self-pay | Admitting: Obstetrics & Gynecology

## 2019-01-12 ENCOUNTER — Ambulatory Visit (INDEPENDENT_AMBULATORY_CARE_PROVIDER_SITE_OTHER): Payer: Self-pay | Admitting: Obstetrics & Gynecology

## 2019-01-12 ENCOUNTER — Ambulatory Visit (INDEPENDENT_AMBULATORY_CARE_PROVIDER_SITE_OTHER): Payer: Self-pay

## 2019-01-12 ENCOUNTER — Other Ambulatory Visit: Payer: Self-pay

## 2019-01-12 DIAGNOSIS — R1032 Left lower quadrant pain: Secondary | ICD-10-CM

## 2019-01-12 DIAGNOSIS — R102 Pelvic and perineal pain: Secondary | ICD-10-CM

## 2019-01-12 NOTE — Progress Notes (Signed)
    Aimee Figueroa Aimee Figueroa 12/01/1970 [id number]        48 y.o.  [id number] Married  RP: Left lower abdominal pain intermittently x 2 weeks  HPI: S/P LAVH/Bilateral Salpingectomy.  Intermittent left lower abdominal pain with a feeling of being bloated, alternating constipation/diarrhea associated with IBS.  Televisit with Dr Silverio Decamp 01/03/2019 with management recommendations and f/u organized.  Here today for a Pelvic US to r/o a gyn origin of pain.     OB History  Gravida Para Term Preterm AB Living  7 5 5  0 2 5  SAB TAB Ectopic Multiple Live Births  2       5    # Outcome Date GA Lbr Len/2nd Weight Sex Delivery Anes PTL Lv  7 Term 03/11/12 [redacted]w[redacted]d 12:51 / 00:15 8 lb 7.1 oz (3.83 kg) M VBAC EPI  LIV     Birth Comments: Weakness in right arm  6 Term 10/01/06    F CS-LTranv EPI N LIV     Birth Comments: breech  5 Term 01/02/03    M Vag-Spont None N LIV  4 Term 09/16/00    M Vag-Spont None N LIV  3 Term 09/16/96    F Vag-Spont EPI N LIV  2 SAB           1 SAB             Past medical history,surgical history, problem list, medications, allergies, family history and social history were all reviewed and documented in the EPIC chart.   Directed ROS with pertinent positives and negatives documented in the history of present illness/assessment and plan.  Exam:  There were no vitals filed for this visit. General appearance:  Normal  Pelvic US today: T/V images.  Status post total hysterectomy.  Right ovary with a thick-walled cyst with internal echoes measuring 2.0 x 1.6 x 1.7 cm with color flow Doppler positive at the periphery.  Suggestive of a corpus luteum cyst.  Left ovary normal.  No free fluid in the posterior cul-de-sac.   Assessment/Plan:  48 y.o. [id number]   1. Left lower quadrant abdominal pain Pelvic US findings reviewed with patient.  Normal bilateral ovaries with a Rt small CL cyst, no FF and no pelvic mass.  Patient reassured.  Origin of lower abdominal discomfort is  probably due to IBS.  Seen by Gertie Fey Dr Silverio Decamp on 01/03/2019 in a Televisit, all recommendations given by Dr Silverio Decamp and f/u organized.  Counseling on above issues and coordination of care >50% x 15 minutes.  [name] MD, 9:11 AM 01/12/2019

## 2019-01-12 NOTE — Patient Instructions (Signed)
1. Left lower quadrant abdominal pain Pelvic US findings reviewed with patient.  Normal bilateral ovaries with a Rt small CL cyst, no FF and no pelvic mass.  Patient reassured.  Origin of lower abdominal discomfort is probably due to IBS.  Seen by Gertie Fey Dr Silverio Decamp on 01/03/2019 in a Televisit, all recommendations given by Dr Silverio Decamp and f/u organized.  Aimee Figueroa, it was a pleasure seeing you today!

## 2019-02-01 ENCOUNTER — Other Ambulatory Visit: Payer: Self-pay | Admitting: Nurse Practitioner

## 2019-02-01 ENCOUNTER — Ambulatory Visit: Payer: Self-pay | Attending: Nurse Practitioner | Admitting: Nurse Practitioner

## 2019-02-01 ENCOUNTER — Encounter: Payer: Self-pay | Admitting: Nurse Practitioner

## 2019-02-01 ENCOUNTER — Other Ambulatory Visit: Payer: Self-pay

## 2019-02-01 DIAGNOSIS — F419 Anxiety disorder, unspecified: Secondary | ICD-10-CM

## 2019-02-01 DIAGNOSIS — R208 Other disturbances of skin sensation: Secondary | ICD-10-CM

## 2019-02-01 DIAGNOSIS — M799 Soft tissue disorder, unspecified: Secondary | ICD-10-CM

## 2019-02-01 DIAGNOSIS — F329 Major depressive disorder, single episode, unspecified: Secondary | ICD-10-CM

## 2019-02-01 DIAGNOSIS — R0781 Pleurodynia: Secondary | ICD-10-CM

## 2019-02-01 MED ORDER — LIDOCAINE (ANORECTAL) 5 % EX GEL: CUTANEOUS | 1 refills | Status: AC

## 2019-02-01 MED ORDER — NORTRIPTYLINE HCL 25 MG PO CAPS: 25.0000 mg | ORAL_CAPSULE | Freq: Every day | ORAL | 3 refills | Status: AC

## 2019-02-01 MED FILL — NORTRIPTYLINE HCL 25 MG CAP: 25 | 30 days supply | Qty: 30 | Fill #0

## 2019-02-01 NOTE — Progress Notes (Signed)
Virtual Visit via Telephone Note Due to national recommendations of social distancing due to [place] 19, telehealth visit is felt to be most appropriate for this patient at this time.  I discussed the limitations, risks, security and privacy concerns of performing an evaluation and management service by telephone and the availability of in person appointments. I also discussed with the patient that there may be a patient responsible charge related to this service. The patient expressed understanding and agreed to proceed.    I connected with Aimee Figueroa on 02/01/19  at   2:50 PM EDT  EDT by telephone and verified that I am speaking with the correct person using two identifiers.   Consent I discussed the limitations, risks, security and privacy concerns of performing an evaluation and management service by telephone and the availability of in person appointments. I also discussed with the patient that there may be a patient responsible charge related to this service. The patient expressed understanding and agreed to proceed.   Location of Patient: Private Residence   Location of Provider: [facility] participating in Telemedicine visit: Geryl Rankins FNP-BC [place]    History of Present Illness: Telemedicine visit for: Follow up to anxiety and depression. She also has complaints of left sided rib cage pain.   Musculoskeletal Pain Since 3 weeks ago she has been experiencing left sided rib cage pain. She denies any trauma, injury, cough or shortness of breath. States when she presses on her lower rib cage she feels pain. Denies any masses, swelling or bruising.    Rectal Burning Patient complains of evaluation of rectal itching. Symptoms are chronic and occurring intermittently based on bowel pattern. She does have a history of constipation and is currently being followed by GI for IBS. She  describes symptoms as anorectal itching, painful defecation and anorectal burning. Treatment to date has been fiber supplements: not very effective. Patient denies maroon colored stools, melena and receptive anal intercourse. Endorses rectal itching and burning. Endorses constipation.  Denies bleeding.   Anxiety and Depression Well controlled however she is requesting to decrease her dose of Pamelor due to symptoms of excessive drowsiness. She currently denies any thoughts of self harm.  Depression screen [facility] 2/9 02/01/2019 12/07/2018 11/02/2018 10/05/2018 07/30/2018  Decreased Interest 0 1 1 1 1   Down, Depressed, Hopeless 0 1 1 1 1   PHQ - 2 Score 0 2 2 2 2   Altered sleeping 0 0 0 0 1  Tired, decreased energy 0 1 1 1 1   Change in appetite 0 0 0 0 0  Feeling bad or failure about yourself  0 1 2 1 1   Trouble concentrating 0 0 1 1 1   Moving slowly or fidgety/restless 0 0 1 0 0  Suicidal thoughts 0 1 1 1 1   PHQ-9 Score 0 5 8 6 7   Some recent data might be hidden   GAD 7 : Generalized Anxiety Score 02/01/2019 12/07/2018 11/02/2018 10/05/2018  Nervous, Anxious, on Edge 0 2 3 1   Control/stop worrying 0 2 2 2   Worry too much - different things 0 2 3 1   Trouble relaxing 0 1 - 1  Restless 0 1 - 0  Easily annoyed or irritable 0 1 - 0  Afraid - awful might happen 0 2 - 2  Total GAD 7 Score 0 11 - 7     Past Medical History:  Diagnosis Date  .  Abnormal Pap smear   . Allergy    seasonal  . Anxiety   . Depression   . Fibromyalgia   . Hyperlipemia   . Low blood pressure   . Pyloric ulcer associated with Helicobacter pylori, acute 05/22/2017   patient not sure if she has an ulcer - first dx  . SVD (spontaneous vaginal delivery)    x 4  . 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 CESAREAN SECTION    . COLONOSCOPY    . GYNECOLOGIC CRYOSURGERY    . LAPAROSCOPIC ASSISTED VAGINAL HYSTERECTOMY N/A 05/26/2017   Procedure: LAPAROSCOPIC ASSISTED VAGINAL  HYSTERECTOMY;  Surgeon: [name], MD;  Location: [facility];  Service: Gynecology;  Laterality: N/A;  . LAPAROSCOPIC BILATERAL SALPINGECTOMY Bilateral 05/26/2017   Procedure: LAPAROSCOPIC BILATERAL SALPINGECTOMY;  Surgeon: [name], MD;  Location: [facility];  Service: Gynecology;  Laterality: Bilateral;  . UPPER GI ENDOSCOPY      Family History  Problem Relation Age of Onset  . Diabetes Father   . Hyperlipidemia Father   . Hyperlipidemia Mother   . Uterine cancer Paternal Aunt   . Ulcerative colitis Cousin   . Anesthesia problems Neg Hx   . Colon cancer Neg Hx   . Rectal cancer Neg Hx   . Esophageal cancer Neg Hx   . Liver cancer Neg Hx     Social History   Socioeconomic History  . Marital status: Married    Spouse name: Not on file  . Number of children: 5  . Years of education: Not on file  . Highest education level: Not on file  Occupational History  . Occupation: homemaker  Social Needs  . Financial resource strain: Not on file  . Food insecurity:    Worry: Not on file    Inability: Not on file  . Transportation needs:    Medical: Not on file    Non-medical: Not on file  Tobacco Use  . Smoking status: Never Smoker  . Smokeless tobacco: Never Used  Substance and Sexual Activity  . Alcohol use: No  . Drug use: No  . Sexual activity: Yes    Partners: Male    Birth control/protection: Injection  Lifestyle  . Physical activity:    Days per week: Not on file    Minutes per session: Not on file  . Stress: Not on file  Relationships  . Social connections:    Talks on phone: Not on file    Gets together: Not on file    Attends religious service: Not on file    Active member of club or organization: Not on file    Attends meetings of clubs or organizations: Not on file    Relationship status: Not on file  Other Topics Concern  . Not on file  Social History Narrative  . Not on file     Observations/Objective: Awake, alert and oriented x  3   Review of Systems  Constitutional: Negative for fever, malaise/fatigue and weight loss.  HENT: Negative.  Negative for nosebleeds.   Eyes: Negative.  Negative for blurred vision, double vision and photophobia.  Respiratory: Negative.  Negative for cough and shortness of breath.   Cardiovascular: Negative.  Negative for chest pain, palpitations and leg swelling.  Gastrointestinal: Negative.  Negative for heartburn, nausea and vomiting.  Musculoskeletal: Negative.  Negative for myalgias.       SEE HPI  Neurological: Negative.  Negative for  dizziness, focal weakness, seizures and headaches.  Psychiatric/Behavioral: Positive for depression (well controlled). Negative for suicidal ideas. The patient is nervous/anxious (well controlled).     Assessment and Plan: Diagnoses and all orders for this visit:  Rectal burning -     Lidocaine, Anorectal, 5 % GEL; Apply to affected area twice daily.  Anxiety and depression -     nortriptyline (PAMELOR) 25 MG capsule; Take 1 capsule (25 mg total) by mouth at bedtime.   Left Rib Cage Pain She has been instructed to upload pictures via my chart for further evaluation.   Follow Up Instructions Return in about 3 months (around 05/04/2019), or if symptoms worsen or fail to improve.     I discussed the assessment and treatment plan with the patient. The patient was provided an opportunity to ask questions and all were answered. The patient agreed with the plan and demonstrated an understanding of the instructions.   The patient was advised to call back or seek an in-person evaluation if the symptoms worsen or if the condition fails to improve as anticipated.  I provided 24 minutes of non-face-to-face time during this encounter including median intraservice time, reviewing previous notes, labs, imaging, medications and explaining diagnosis and management.  Gildardo Pounds, FNP-BC

## 2019-02-02 ENCOUNTER — Encounter: Payer: Self-pay | Admitting: Nurse Practitioner

## 2019-02-03 IMAGING — MR MR LUMBAR SPINE W/O CM
4 of 5 series · 19 of 48 positions shown · non-contrast
Comparison: Lumbar spine radiograph 04/10/2017

CLINICAL DATA: Low back pain and left-sided sciatica

EXAM:
MRI LUMBAR SPINE WITHOUT CONTRAST
TECHNIQUE: Multiplanar, multisequence MR imaging of the lumbar spine was
performed. No intravenous contrast was administered.

[Series 2: T2 · sagittal · 4.0mm · 0.55mm/px · 7 of 14 slices shown (1 of 2)]
[im 1/14]
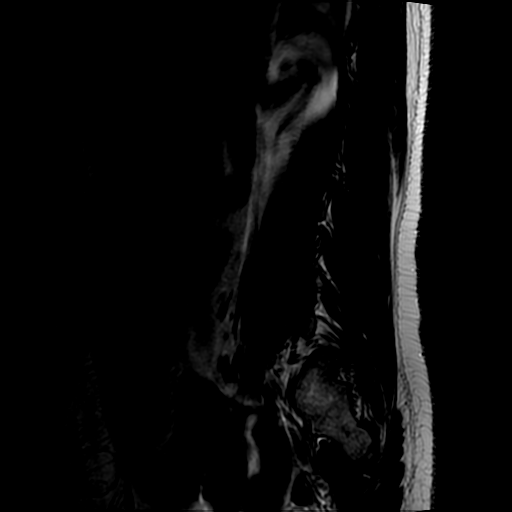
[im 3/14]
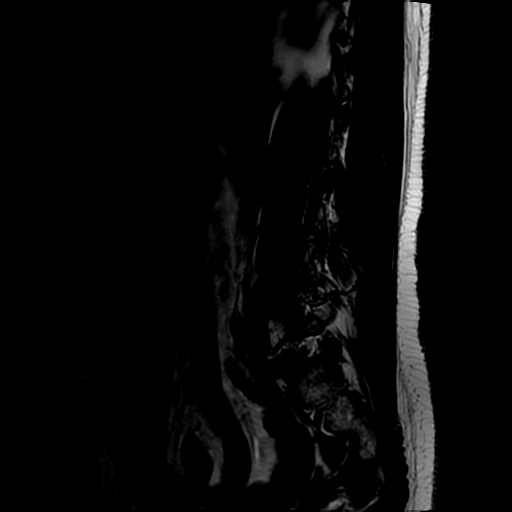
[im 5/14]
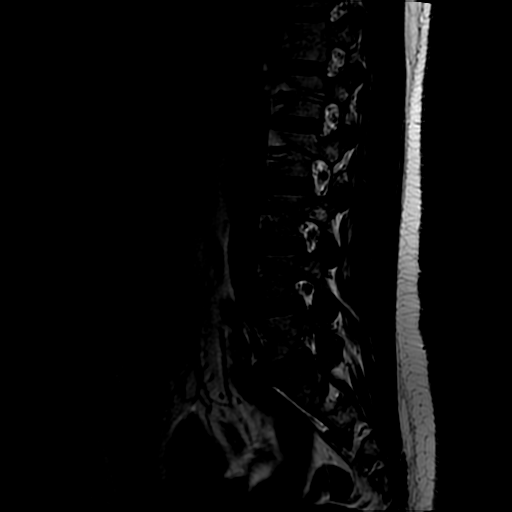
[im 7/14]
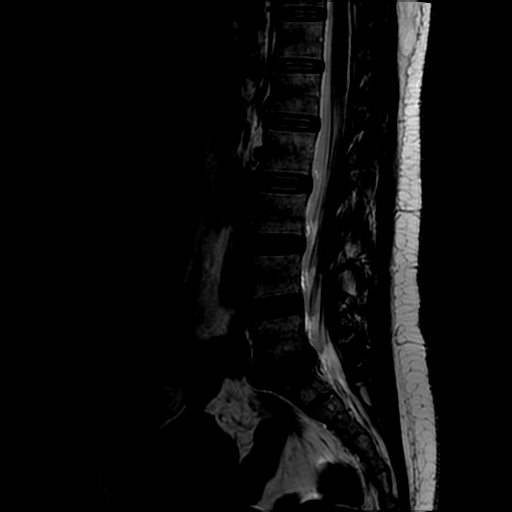
[im 9/14]
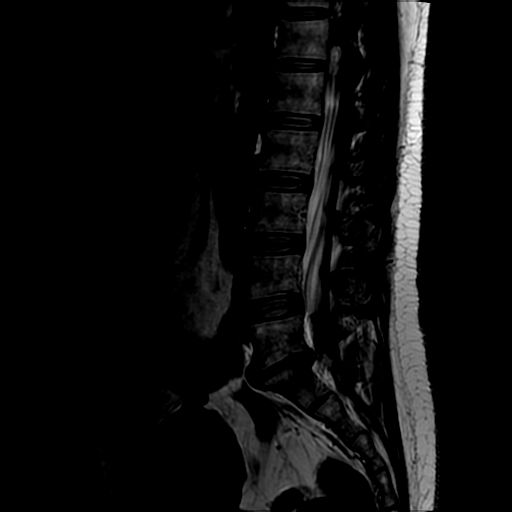
[im 11/14]
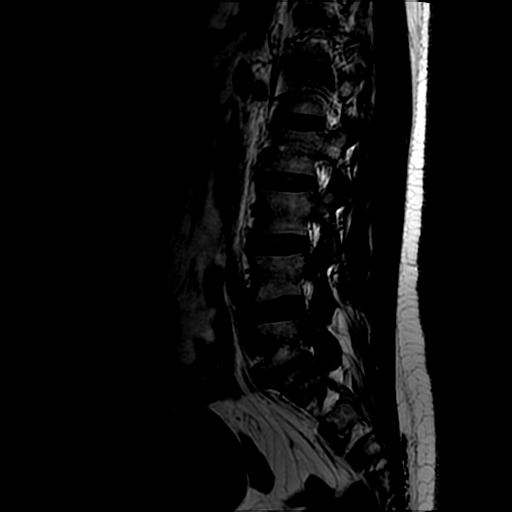
[im 14/14]
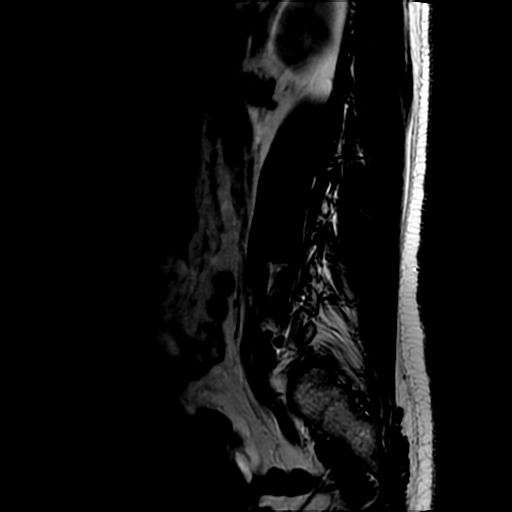

[Series 4: T1 · sagittal · 4.0mm · 0.55mm/px · 3 of 14 slices shown (1 of 2)]
[im 3/14]
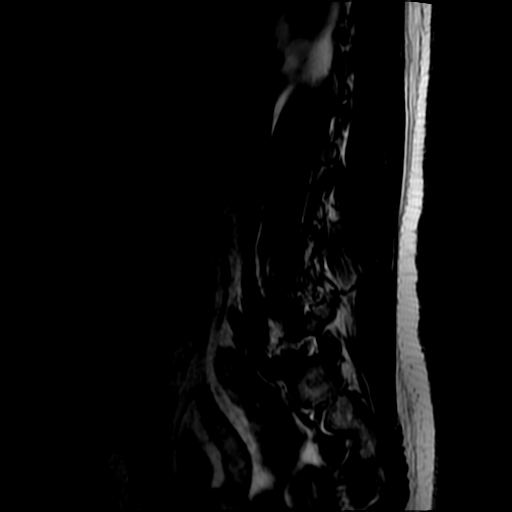
[im 8/14]
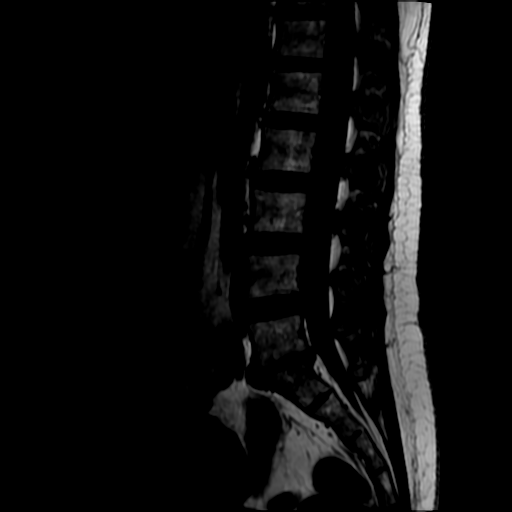
[im 14/14]
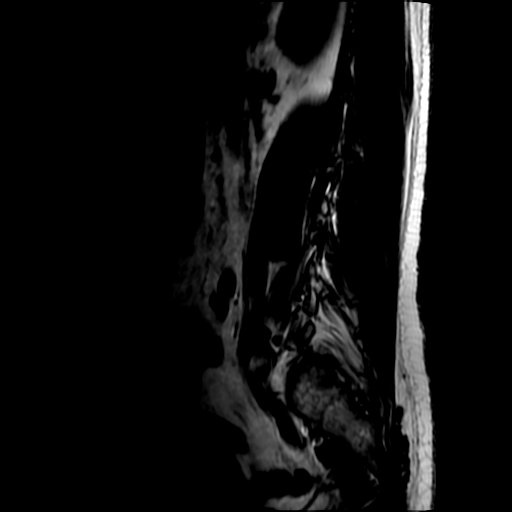

[Series 5: T2 · axial · 4.0mm · 0.39mm/px · z∈[-27,+115]mm · 6 of 31 slices shown (2 of 2)]
[im 1/31]
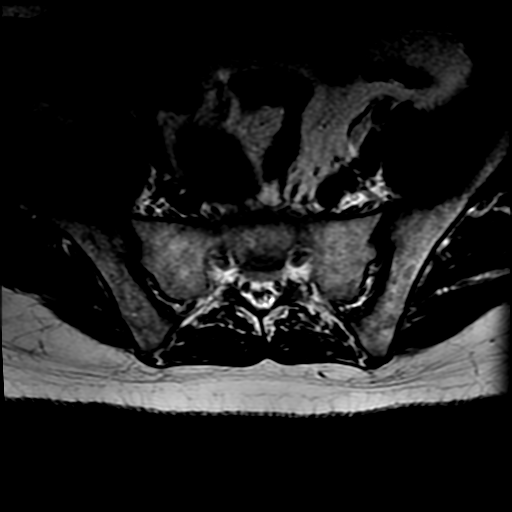
[im 5/31]
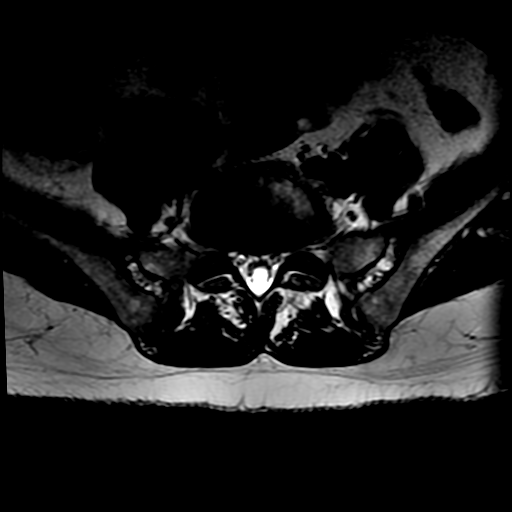
[im 10/31]
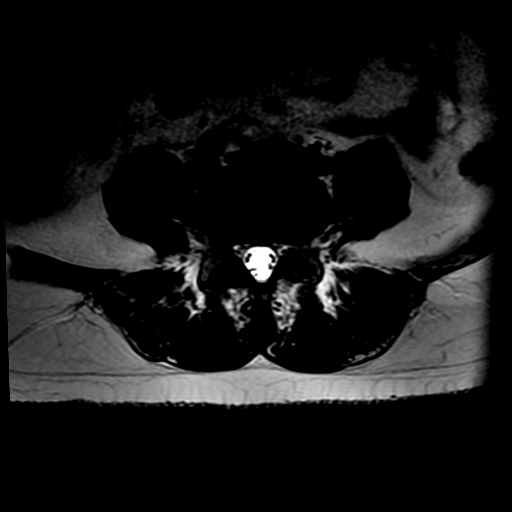
[im 14/31]
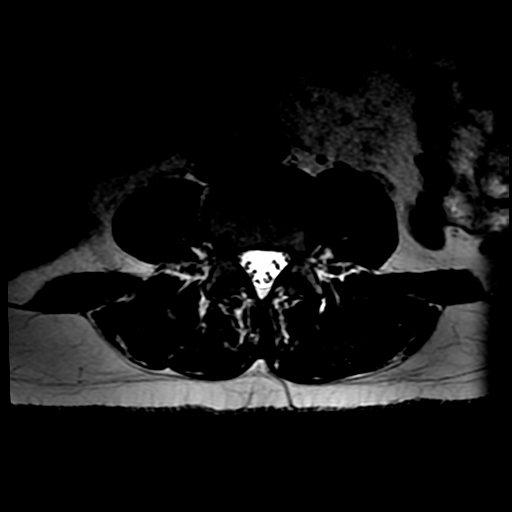
[im 17/31]
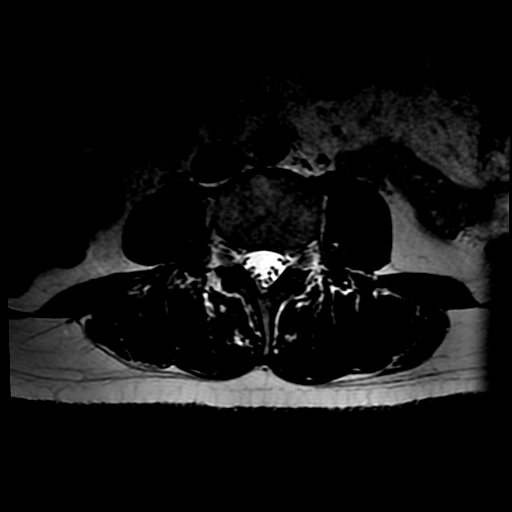
[im 26/31]
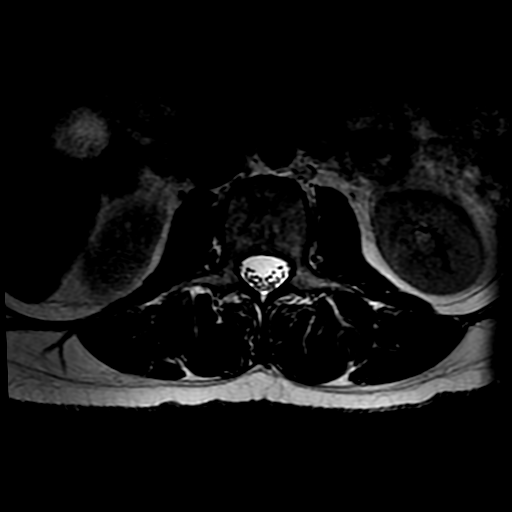

[Series 6: T1 · axial · 4.0mm · 0.39mm/px · z∈[-8,+115]mm · 3 of 31 slices shown (2 of 2)]
[im 5/31]
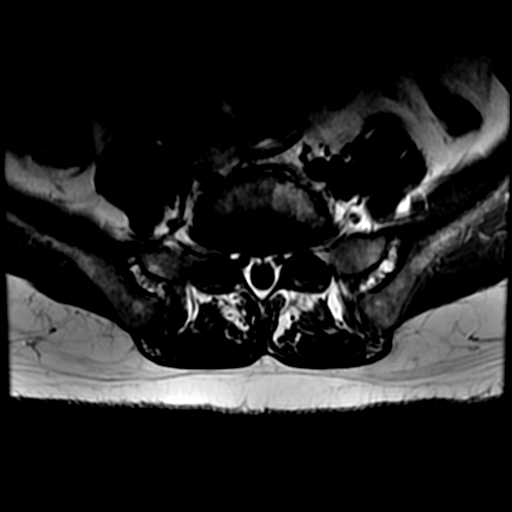
[im 17/31]
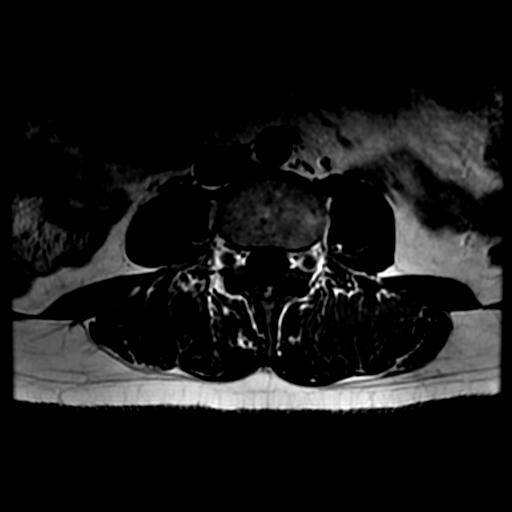
[im 26/31]
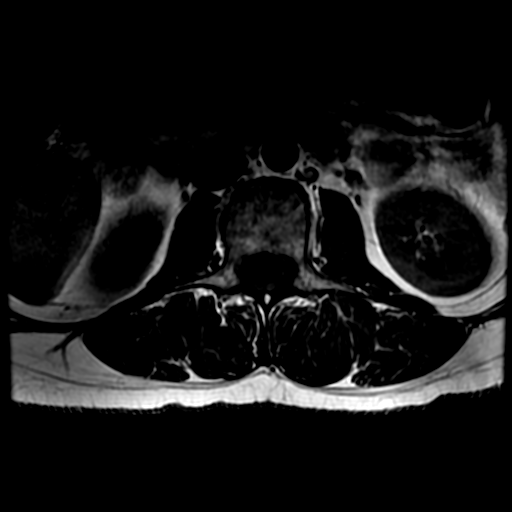

[19 of 48 positions shown; findings below may reference images not displayed]

FINDINGS: Segmentation:  Standard.

Alignment:  Physiologic.

Vertebrae:  No fracture, evidence of discitis, or bone lesion.

Conus medullaris: Extends to the L1 level and appears normal.

Paraspinal and other soft tissues: Negative.

Disc levels:

T12-L1: Normal disc space and facets. No spinal canal or
neuroforaminal stenosis.

L1-L2: Normal disc space and facets. No spinal canal or
neuroforaminal stenosis.

L2-L3: Normal disc space and facets. No spinal canal or
neuroforaminal stenosis.

L3-L4: Normal disc space and facets. No spinal canal or
neuroforaminal stenosis.

L4-L5: Normal disc space and facets. No spinal canal or
neuroforaminal stenosis.

L5-S1: Disc desiccation and height loss with small bulge. There is
endplate spurring. Mild bilateral neural foraminal stenosis.

Visualized sacrum: Normal.
IMPRESSION: 1. L5-S1 degenerative disc disease with mild bilateral neural
foraminal narrowing.
2. No spinal canal stenosis.

## 2019-02-07 ENCOUNTER — Telehealth: Payer: Self-pay | Admitting: Nurse Practitioner

## 2019-02-07 NOTE — Telephone Encounter (Signed)
CMA spoke to patient to inform to call [phone] to schedule her Chest Ultrasound.   Patient understood.

## 2019-02-07 NOTE — Telephone Encounter (Signed)
Pt called in to check in about the ultrasound please followup

## 2019-02-08 NOTE — Progress Notes (Signed)
CMA scheduled patient for ultrasound on 02/14/2019.  Patient is informed.

## 2019-02-14 ENCOUNTER — Telehealth: Payer: Self-pay | Admitting: Gastroenterology

## 2019-02-14 ENCOUNTER — Ambulatory Visit (HOSPITAL_COMMUNITY)
Admission: RE | Admit: 2019-02-14 | Discharge: 2019-02-14 | Disposition: A | Discharge status: Home / Self Care | Payer: Self-pay | Source: Ambulatory Visit | Attending: Nurse Practitioner | Admitting: Nurse Practitioner

## 2019-02-14 ENCOUNTER — Other Ambulatory Visit: Payer: Self-pay

## 2019-02-14 ENCOUNTER — Other Ambulatory Visit: Payer: Self-pay | Admitting: Nurse Practitioner

## 2019-02-14 DIAGNOSIS — M799 Soft tissue disorder, unspecified: Secondary | ICD-10-CM

## 2019-02-14 NOTE — Telephone Encounter (Signed)
Having a daily bowel movement on the Benefiber. She does not feel emptied. Reports lots of explosive gas at the first of the bowel movement. The stool is small, soft and pencil size. She feels she needs to go more. Please advise.

## 2019-02-14 NOTE — Telephone Encounter (Signed)
Pt states that lower abd pain has not subsided, she has noticed that pain in her back worsens with bm. She states that she has a bm every day but feels like her colon does not fully empty. She would like some advise.

## 2019-02-14 NOTE — Telephone Encounter (Signed)
Returned call. No answer. Left message to call back

## 2019-02-15 ENCOUNTER — Other Ambulatory Visit: Payer: Self-pay

## 2019-02-15 NOTE — Telephone Encounter (Signed)
Continue benefiber and add Miralax 1 capful daily.

## 2019-02-15 NOTE — Telephone Encounter (Signed)
Patient agrees to this plan. She will call with follow up.

## 2019-02-25 ENCOUNTER — Telehealth: Payer: Self-pay | Admitting: Gastroenterology

## 2019-02-25 NOTE — Telephone Encounter (Signed)
Spoke to the patient who reports LUQ dull, chronic pain that increases if she eats "carbs like rice." The patient also stated that she experiences "burning of the colon." When questions, the burning seems to be centrally located to the rectum/anus. The patient also states that it also itches. She uses prep H but states it is not working and the pain is getting worse. Denies bleeding. The patient states when she has a bowel movement, "its thin strips, like ribbon." The patient states she is taking fiber but has not noticed a difference. This RN scheduled the patient for a virtual visit on 6/17 at 9:30 am. Do you want the patient to have an in office visit per your last office note or will a virtual visit suffice. Please advise.

## 2019-02-25 NOTE — Telephone Encounter (Signed)
Please switch her to office visit given her persistent rectal symptoms.  Thank you

## 2019-02-25 NOTE — Telephone Encounter (Signed)
Pt would like to speak with you regarding stomach pain that she has been experiencing.

## 2019-02-28 ENCOUNTER — Encounter: Payer: Self-pay | Admitting: *Deleted

## 2019-02-28 NOTE — Telephone Encounter (Addendum)
Left a message for the patient to call back and left the patient a MyChart message.   Will tell patient Dr. Silverio Decamp wants to change her virtual visit to an in office IN PERSON visit.

## 2019-03-01 NOTE — Telephone Encounter (Signed)
Left a second message for the patient to call back.   Left a message telling the patient her virtual visit has been changed to an [place] in [place].

## 2019-03-15 ENCOUNTER — Telehealth: Payer: Self-pay | Admitting: *Deleted

## 2019-03-15 NOTE — Telephone Encounter (Signed)
Covid-19 screening questions   Do you now or have you had a fever in the last 14 days?  Do you have any respiratory symptoms of shortness of breath or cough now or in the last 14 days?  Do you have any family members or close contacts with diagnosed or suspected Covid-19 in the past 14 days?  Have you been tested for Covid-19 and found to be positive?       

## 2019-03-16 ENCOUNTER — Ambulatory Visit (INDEPENDENT_AMBULATORY_CARE_PROVIDER_SITE_OTHER): Payer: Self-pay | Admitting: Gastroenterology

## 2019-03-16 ENCOUNTER — Encounter: Payer: Self-pay | Admitting: Gastroenterology

## 2019-03-16 VITALS — BP 124/64 | HR 77 | Temp 98.4°F | Ht 61.0 in | Wt 148.0 lb

## 2019-03-16 DIAGNOSIS — R1032 Left lower quadrant pain: Secondary | ICD-10-CM

## 2019-03-16 DIAGNOSIS — K581 Irritable bowel syndrome with constipation: Secondary | ICD-10-CM

## 2019-03-16 DIAGNOSIS — K5902 Outlet dysfunction constipation: Secondary | ICD-10-CM

## 2019-03-16 DIAGNOSIS — R102 Pelvic and perineal pain unspecified side: Secondary | ICD-10-CM

## 2019-03-16 DIAGNOSIS — K6289 Other specified diseases of anus and rectum: Secondary | ICD-10-CM

## 2019-03-16 NOTE — Telephone Encounter (Signed)
Covid-19 screening questions   Do you now or have you had a fever in the last 14 days? no  Do you have any respiratory symptoms of shortness of breath or cough now or in the last 14 days? no  Do you have any family members or close contacts with diagnosed or suspected Covid-19 in the past 14 days? No   Have you been tested for Covid-19 and found to be positive? No

## 2019-03-16 NOTE — Patient Instructions (Signed)
You have been scheduled for a CT scan of the abdomen and pelvis at [facility] ([address]---this is in the same building as Press photographer).   You are scheduled on 03/30/2019 at 10:45am. You should arrive 15 minutes prior to your appointment time for registration. Please follow the written instructions below on the day of your exam:  WARNING: IF YOU ARE ALLERGIC TO IODINE/X-RAY DYE, PLEASE NOTIFY RADIOLOGY IMMEDIATELY AT [phone]! YOU WILL BE GIVEN A 13 HOUR PREMEDICATION PREP.  1) Do not eat or drink anything after 6:45am (4 hours prior to your test) 2) You have been given 2 bottles of oral contrast to drink. The solution may taste better if refrigerated, but do NOT add ice or any other liquid to this solution. Shake well before drinking.    Drink 1 bottle of contrast @ 8:45am (2 hours prior to your exam)  Drink 1 bottle of contrast @ 9:45am (1 hour prior to your exam)  You may take any medications as prescribed with a small amount of water, if necessary. If you take any of the following medications: METFORMIN, GLUCOPHAGE, GLUCOVANCE, AVANDAMET, RIOMET, FORTAMET, Glandorf MET, JANUMET, GLUMETZA or METAGLIP, you MAY be asked to HOLD this medication 48 hours AFTER the exam.  The purpose of you drinking the oral contrast is to aid in the visualization of your intestinal tract. The contrast solution may cause some diarrhea. Depending on your individual set of symptoms, you may also receive an intravenous injection of x-ray contrast/dye. Plan on being at [facility] for 30 minutes or longer, depending on the type of exam you are having performed.  This test typically takes 30-45 minutes to complete.  If you have any questions regarding your exam or if you need to reschedule, you may call the CT department at [phone] between the hours of 8:00 am and 5:00 pm, Monday-Friday.  Continue Benefiber 1 tablespoon twice daily  We will refer you for Pelvic Floor Therapy  and they will contact you with that appointment   Use your nitroglycerin per rectum as needed 1-2 times daily   Follow up in 3 months  I appreciate the  opportunity to care for you  Thank You   Harl Bowie , MD   ________________________________________________________________________

## 2019-03-16 NOTE — Telephone Encounter (Signed)
Pt returned your call to prescreen.  °

## 2019-03-16 NOTE — Progress Notes (Signed)
[address] Aimee Figueroa    [id number]    1971/02/16  Primary Care Physician:Fleming, Vernia Buff, NP  Referring Physician: Gildardo Pounds, NP [place],  [place] [id number]   Chief complaint: Left upper quadrant abdominal pain, rectal discomfort HPI:  48 year old female with history of chronic irritable bowel syndrome predominant constipation and abdominal pain She continues to have intermittent left-sided abdominal pain radiating to her back and hip She has had extensive GI work-up including EGD, colonoscopy, CT abdomen and pelvis with no significant pathology to explain the pain History of H. pylori gastritis status post treatment with confirmed eradication History of anal fissure treated with nitroglycerin per rectum She is currently taking Benefiber and stool softener Denies any nausea, vomiting, melena or bright red blood per rectum.  No decreased appetite or weight loss.    Outpatient Encounter Medications as of 03/16/2019  Medication Sig  . atorvastatin (LIPITOR) 20 MG tablet Take 1 tablet (20 mg total) by mouth daily.  . Cholecalciferol (VITAMIN D3) 2000 units TABS Take 2,000 Units by mouth daily.  Marland Kitchen dicyclomine (BENTYL) 20 MG tablet Take 1 tablet (20 mg total) by mouth every 6 (six) hours.  . Lidocaine, Anorectal, 5 % GEL Apply to affected area twice daily.  . nortriptyline (PAMELOR) 25 MG capsule Take 1 capsule (25 mg total) by mouth at bedtime.  Marland Kitchen omega-3 acid ethyl esters (LOVAZA) 1 g capsule Take 2 capsules (2 g total) by mouth 2 (two) times daily for 30 days.  Marland Kitchen omeprazole (PRILOSEC) 40 MG capsule Take 1 capsule (40 mg total) by mouth daily before breakfast.   No facility-administered encounter medications on file as of 03/16/2019.     Allergies as of 03/16/2019 - Review Complete 02/01/2019  Allergen Reaction Noted  . Tylenol [acetaminophen] Rash 09/17/2011    Past Medical History:  Diagnosis Date  . Abnormal Pap smear   .  Allergy    seasonal  . Anxiety   . Depression   . Fibromyalgia   . Hyperlipemia   . Low blood pressure   . Pyloric ulcer associated with Helicobacter pylori, acute 05/22/2017   patient not sure if she has an ulcer - first dx  . SVD (spontaneous vaginal delivery)    x 4  . 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 CESAREAN SECTION    . COLONOSCOPY    . GYNECOLOGIC CRYOSURGERY    . LAPAROSCOPIC ASSISTED VAGINAL HYSTERECTOMY N/A 05/26/2017   Procedure: LAPAROSCOPIC ASSISTED VAGINAL HYSTERECTOMY;  Surgeon: [name], MD;  Location: [facility];  Service: Gynecology;  Laterality: N/A;  . LAPAROSCOPIC BILATERAL SALPINGECTOMY Bilateral 05/26/2017   Procedure: LAPAROSCOPIC BILATERAL SALPINGECTOMY;  Surgeon: [name], MD;  Location: [facility];  Service: Gynecology;  Laterality: Bilateral;  . UPPER GI ENDOSCOPY      Family History  Problem Relation Age of Onset  . Diabetes Father   . Hyperlipidemia Father   . Hyperlipidemia Mother   . Uterine cancer Paternal Aunt   . Ulcerative colitis Cousin   . Anesthesia problems Neg Hx   . Colon cancer Neg Hx   . Rectal cancer Neg Hx   . Esophageal cancer Neg Hx   . Liver cancer Neg Hx     Social History   Socioeconomic History  . Marital status: Married    Spouse name:  Not on file  . Number of children: 5  . Years of education: Not on file  . Highest education level: Not on file  Occupational History  . Occupation: homemaker  Social Needs  . Financial resource strain: Not on file  . Food insecurity    Worry: Not on file    Inability: Not on file  . Transportation needs    Medical: Not on file    Non-medical: Not on file  Tobacco Use  . Smoking status: Never Smoker  . Smokeless tobacco: Never Used  Substance and Sexual Activity  . Alcohol use: No  . Drug use: No  . Sexual activity: Yes    Partners: Male    Birth control/protection: Injection  Lifestyle  . Physical  activity    Days per week: Not on file    Minutes per session: Not on file  . Stress: Not on file  Relationships  . Social Herbalist on phone: Not on file    Gets together: Not on file    Attends religious service: Not on file    Active member of club or organization: Not on file    Attends meetings of clubs or organizations: Not on file    Relationship status: Not on file  . Intimate partner violence    Fear of current or ex partner: Not on file    Emotionally abused: Not on file    Physically abused: Not on file    Forced sexual activity: Not on file  Other Topics Concern  . Not on file  Social History Narrative  . Not on file      Review of systems: Review of Systems  Constitutional: Negative for fever and chills.  HENT: Negative.   Eyes: Negative for blurred vision.  Respiratory: Negative for cough, shortness of breath and wheezing.   Cardiovascular: Negative for chest pain and palpitations.  Gastrointestinal: as per HPI Genitourinary: Negative for dysuria, urgency, frequency and hematuria.  Musculoskeletal: Negative for myalgias, back pain and joint pain.  Skin: Negative for itching and rash.  Neurological: Negative for dizziness, tremors, focal weakness, seizures and loss of consciousness.  Endo/Heme/Allergies: Positive for seasonal allergies.  Psychiatric/Behavioral: Negative for depression, suicidal ideas and hallucinations.  All other systems reviewed and are negative.   Physical Exam: Vitals:   03/16/19 0937  BP: 124/64  Pulse: 77  Temp: 98.4 F (36.9 C)  SpO2: 99%   Body mass index is 27.96 kg/m. Gen:      No acute distress HEENT:  EOMI, sclera anicteric Neck:     No masses; no thyromegaly Lungs:    Clear to auscultation bilaterally; normal respiratory effort CV:         Regular rate and rhythm; no murmurs Abd:      + bowel sounds; soft, non-tender; no palpable masses, no distension Ext:    No edema; adequate peripheral perfusion Skin:       Warm and dry; no rash Neuro: alert and oriented x 3 Psych: normal mood and affect Rectal exam: Normal anal sphincter tone, no anal fissure or external hemorrhoids Anoscopy: Small internal hemorrhoids, no active bleeding, normal dentate line, no visible nodules  Data Reviewed:  Reviewed labs, radiology imaging, old records and pertinent past GI work up   Assessment and Plan/Recommendations:  48 year old female with chronic irritable bowel syndrome predominant constipation and chronic left-sided abdominal pain No specific pathology noted despite extensive GI work-up and imaging.  Likely muscular skeletal pain or functional  abdominal pain  Rectal discomfort secondary to spasm, no evidence of anal fissure based on anoscopy Use rectal nitroglycerin once or twice daily as needed, small pea-sized amount  Continue high-fiber diet and increased water intake Continue Benefiber Continue MiraLAX as needed  Follow-up in 3 months  25 minutes was spent face-to-face with the patient. Greater than 50% of the time used for counseling as well as treatment plan and follow-up. She had multiple questions which were answered to her satisfaction  K. Denzil Magnuson , MD    CC: Gildardo Pounds, NP

## 2019-03-21 ENCOUNTER — Ambulatory Visit: Payer: Self-pay | Attending: Gastroenterology | Admitting: Physical Therapy

## 2019-03-21 ENCOUNTER — Other Ambulatory Visit: Payer: Self-pay

## 2019-03-21 ENCOUNTER — Encounter: Payer: Self-pay | Admitting: Physical Therapy

## 2019-03-21 DIAGNOSIS — M6281 Muscle weakness (generalized): Secondary | ICD-10-CM | POA: Insufficient documentation

## 2019-03-21 DIAGNOSIS — R278 Other lack of coordination: Secondary | ICD-10-CM | POA: Insufficient documentation

## 2019-03-21 NOTE — Therapy (Signed)
[facility] [address] [place], [place], [address] Phone: [phone]   Fax:  [phone]  Physical Therapy Evaluation  Patient Details  Name: Aimee Figueroa MRN: [id number] Date of Birth: [birth year] Referring Provider (PT): Dr. Harl Bowie   Encounter Date: 03/21/2019  PT End of Session - 03/21/19 1431    Visit Number  1    Date for PT Re-Evaluation  06/13/19    PT Start Time  1405    PT Stop Time  1445    PT Time Calculation (min)  40 min    Activity Tolerance  Patient tolerated treatment well    Behavior During Therapy  [facility] for tasks assessed/performed       Past Medical History:  Diagnosis Date  . Abnormal Pap smear   . Allergy    seasonal  . Anxiety   . Depression   . Fibromyalgia   . Hyperlipemia   . Low blood pressure   . Pyloric ulcer associated with Helicobacter pylori, acute 05/22/2017   patient not sure if she has an ulcer - first dx  . SVD (spontaneous vaginal delivery)    x 4  . 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 CESAREAN SECTION    . COLONOSCOPY    . GYNECOLOGIC CRYOSURGERY    . LAPAROSCOPIC ASSISTED VAGINAL HYSTERECTOMY N/A 05/26/2017   Procedure: LAPAROSCOPIC ASSISTED VAGINAL HYSTERECTOMY;  Surgeon: [name], MD;  Location: [facility];  Service: Gynecology;  Laterality: N/A;  . LAPAROSCOPIC BILATERAL SALPINGECTOMY Bilateral 05/26/2017   Procedure: LAPAROSCOPIC BILATERAL SALPINGECTOMY;  Surgeon: [name], MD;  Location: [facility];  Service: Gynecology;  Laterality: Bilateral;  . UPPER GI ENDOSCOPY      There were no vitals filed for this visit.   Subjective Assessment - 03/21/19 1406    Subjective  Patient has had difficulty with bowel changes. Patient has pain in left upper quadrant, pelvic pain and lower back pain. Patient is having a CT scan of the stomach. MD sent her here to have a bowel movement.  When stadning feels the anus is opening, strain to ahve a bowel movement.  patient goes daily in the morning. Patient gets the sensation to have a bowel movement but does not go.    Patient Stated Goals  help bowel movement    Currently in Pain?  Yes    Pain Score  6     Pain Location  Abdomen   back   Pain Orientation  Lower    Pain Descriptors / Indicators  Cramping    Pain Type  Chronic pain    Pain Radiating Towards  radiates to the back    Pain Onset  More than a month ago    Pain Frequency  Intermittent    Aggravating Factors   stress, worried about what is going on, standing    Pain Relieving Factors  eating certain foods,    Multiple Pain Sites  No         [facility] PT Assessment - 03/21/19 0001      Assessment   Medical Diagnosis  K59.02 Constipation, outlet dysfunction    Referring Provider (PT)  Dr. Harl Bowie    Prior Therapy  none      Precautions   Precautions  None      Restrictions   Weight Bearing Restrictions  No      Balance Screen  Has the patient fallen in the past 6 months  No    Has the patient had a decrease in activity level because of a fear of falling?   No    Is the patient reluctant to leave their home because of a fear of falling?   No      Home Film/video editor residence      Prior Function   Level of Independence  Independent      Cognition   Overall Cognitive Status  Within Functional Limits for tasks assessed      Posture/Postural Control   Posture/Postural Control  Postural limitations    Postural Limitations  Rounded Shoulders;Forward head;Decreased lumbar lordosis      AROM   Lumbar Flexion  decreased by 25% with tighness  in lumbar    Lumbar Extension  decreased by 25%    Lumbar - Right Side Bend  decreased by 25% with pain      PROM   Right Hip External Rotation   40    Left Hip External Rotation   40      Strength   Right Hip Flexion  4/5    Right Hip External Rotation   4/5    Right Hip  Internal Rotation  4/5    Right Hip ABduction  3+/5    Left Hip Flexion  4/5    Left Hip External Rotation  4/5    Left Hip Internal Rotation  4/5    Left Hip ABduction  3+/5      Palpation   Spinal mobility  T5-L5 decreased movement,    SI assessment   left ilium rotated posteriorly    Palpation comment  decreased movement of the lower rib cage, tendernes along the c-section scar and around the umbilicus, tenderness located on the piriformis, lumbar paraspinals, quadratus      Special Tests    Special Tests  Sacrolliac Tests    Sacroiliac Tests   Pelvic Distraction      Pelvic Dictraction   Findings  Positive    Side   Left    Comment  pai                Objective measurements completed on examination: See above findings.    Pelvic Floor Special Questions - 03/21/19 0001    Prior Pregnancies  Yes    Number of C-Sections  1    Number of Vaginal Deliveries  4    Currently Sexually Active  No    Marinoff Scale  no problems    Urinary Leakage  Yes    Pad use  1    Activities that cause leaking  Coughing;Sneezing    Urinary urgency  Yes    Urinary frequency  goes often    Fecal incontinence  No    Falling out feeling (prolapse)  No    Skin Integrity  Intact    Perineal Body/Introitus   Elevated   tender   External Palpation  tenderness around the anal canal    Prolapse  None    Pelvic Floor Internal Exam  patient confirms identification and approves PT to assess the pelvic floor and treatment    Exam Type  Vaginal;Rectal    Palpation  pain at the internal sphincter, -    Strength  Flicker   uncoordinated, inablility to relax the internal sphincter  PT Short Term Goals - 03/21/19 1434      PT SHORT TERM GOAL #1   Title  independent with initial HEP    Time  4    Period  Weeks    Status  New    Target Date  04/18/19      PT SHORT TERM GOAL #2   Title  able to relax the internal sphincter with pain level decreased by 25%     Time  4    Period  Weeks    Status  New    Target Date  04/18/19      PT SHORT TERM GOAL #3   Title  improved lower rib cage movement so she is able to perform diaphgramatic breathing to relax the pelvic floor    Time  4    Period  Weeks    Status  New    Target Date  04/18/19      PT SHORT TERM GOAL #4   Title  understand correct toileting with relaxation of the pelvic floor and correct breathing    Time  4    Period  Weeks    Status  New    Target Date  04/18/19      PT SHORT TERM GOAL #5   Title  understand perineal massage to relax the pelvic floor muscles to reduce pain.    Time  4    Period  Weeks    Status  New    Target Date  04/18/19        PT Long Term Goals - 03/21/19 1505      PT LONG TERM GOAL #1   Title  independent with HEP and understand how to progress herself    Time  12    Period  Weeks    Status  New    Target Date  06/13/19      PT LONG TERM GOAL #2   Title  able to have a bowel movement when has the urge and straining decreased >/= 75%    Time  12    Period  Weeks    Status  New    Target Date  06/13/19      PT LONG TERM GOAL #3   Title  stand with pelvic pain and lumbar pain decreased >/= 75% due to improved hip strength >/= 4/5    Time  12    Period  Weeks    Status  New    Target Date  06/13/19      PT LONG TERM GOAL #4   Title  pelvic floor strength >/= 3/5 due to ability to fully relax the pelvic floor and contract due to less trigger points in the pelvic floor muscles    Time  12    Period  Weeks    Status  New    Target Date  06/13/19      PT LONG TERM GOAL #5   Title  able to perform daily activities with pain decreased >/= 75% due to improve fascial restrictions and movement    Time  12    Period  Weeks    Status  New    Target Date  06/13/19             Plan - 03/21/19 1451    Clinical Impression Statement  Patient is a 48 year old female with chronic pelvic and abdominal pain. Patient reports her pain came on  suddenly. Patient reports  her abdominal, pelvic and back pain is 6/10. Patient reports her pain is worse with standing, eating certain foods, and daily activities. Left ilium is rotated posteriorly. Lumbar ROM is limited and painful. Patient has weakness in the abdominal and hip muscles. Pelvic floor strength is 1/5 with difficulty coordinating the contractions. Patient has pain at the internal sphincter muscle and unable to place the therapist index finger past the internal anal sphincter. Patient has tenderness located on the levator ani and obturator internist internally. Patient has decreased mobility of the c-section scar, lumbar musculature, and rib cage. Patient reports urinary leakage with laughing, coughing, and sneezing. Patient has a bowel movement daily but if has a second one and only has the urge without a bowel movement. Patient will benefit from skilled therapy to reduce fascial restrictions, improve strength, improve pelvic alignment, improve lumbar and rib cage movement.    Personal Factors and Comorbidities  Comorbidity 1;Fitness    Comorbidities  hysterectomy, fobromyalgia, laproscopic assisted vaginal hysterectomy    Examination-Activity Limitations  Continence;Stand;Toileting    Stability/Clinical Decision Making  Evolving/Moderate complexity    Clinical Decision Making  Moderate    Rehab Potential  Excellent    PT Frequency  1x / week    PT Duration  12 weeks    PT Treatment/Interventions  Biofeedback;Cryotherapy;Electrical Stimulation;Moist Heat;Traction;Therapeutic exercise;Therapeutic activities;Neuromuscular re-education;Patient/family education;Dry needling;Scar mobilization;Manual techniques;Taping;Spinal Manipulations    PT Next Visit Plan  correct ilium, rib mobility, soft tissue work to the c-section scar, spinal mobilization, diaphgramatic breathing    Consulted and Agree with Plan of Care  Patient       Patient will benefit from skilled therapeutic intervention in  order to improve the following deficits and impairments:  Decreased coordination, Increased fascial restricitons, Decreased endurance, Increased muscle spasms, Decreased activity tolerance, Pain, Decreased strength  Visit Diagnosis: 1. Muscle weakness (generalized)   2. Other lack of coordination        Problem List Patient Active Problem List   Diagnosis Date Noted  . Aphthous ulcer of mouth 10/05/2018  . Gastroesophageal reflux disease without esophagitis 10/05/2018  . Left sided abdominal pain 03/18/2018  . 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 Bloating 03/18/2018  . Rectal pressure 03/18/2018  . Moderate major depression ([place]) 11/14/2017  . History of colonic polyps 10/29/2017  . Irritable bowel syndrome 10/29/2017  . Postoperative state 05/26/2017  . Helicobacter pylori (H. pylori) infection 05/22/2017  . Hyperlipidemia 05/22/2017  . Degenerative disc disease at L5-S1 level 04/30/2017  . Lumbar disc herniation 04/30/2017  . Spinal stenosis of lumbar region 04/30/2017  . Chronic left-sided low back pain with left-sided sciatica 04/14/2017  . Cephalalgia 09/06/2014  . Skin depigmentation 09/06/2014  . Accessory skin tags 09/06/2014  . High triglycerides 09/06/2014  . Depression 09/06/2014  . Abnormal LFTs 09/06/2014  . AMA (advanced maternal age) multigravida 35+ 10/01/2011  . History of cesarean section 10/01/2011    Earlie Counts, PT 03/21/19 3:56 PM   [facility] [address] [place], [place], [address] Phone: [phone]   Fax:  [phone]  Name: Calani Gick MRN: [id number] Date of Birth: Jan 18, 1971

## 2019-03-22 ENCOUNTER — Encounter: Payer: Self-pay | Admitting: Gastroenterology

## 2019-03-24 ENCOUNTER — Telehealth: Payer: Self-pay | Admitting: Gastroenterology

## 2019-03-25 NOTE — Telephone Encounter (Signed)
Clarified what the CT will be scanning.and where she will go for the scan.

## 2019-03-29 ENCOUNTER — Telehealth: Payer: Self-pay | Admitting: *Deleted

## 2019-03-29 NOTE — Telephone Encounter (Signed)

## 2019-03-30 ENCOUNTER — Ambulatory Visit (INDEPENDENT_AMBULATORY_CARE_PROVIDER_SITE_OTHER)
Admission: RE | Admit: 2019-03-30 | Discharge: 2019-03-30 | Disposition: A | Discharge status: Home / Self Care | Payer: Self-pay | Source: Ambulatory Visit | Attending: Gastroenterology | Admitting: Gastroenterology

## 2019-03-30 ENCOUNTER — Ambulatory Visit: Payer: Self-pay

## 2019-03-30 ENCOUNTER — Ambulatory Visit: Payer: Self-pay | Admitting: Physical Therapy

## 2019-03-30 ENCOUNTER — Other Ambulatory Visit: Payer: Self-pay

## 2019-03-30 DIAGNOSIS — R102 Pelvic and perineal pain unspecified side: Secondary | ICD-10-CM

## 2019-03-30 DIAGNOSIS — K5902 Outlet dysfunction constipation: Secondary | ICD-10-CM

## 2019-03-30 DIAGNOSIS — R1032 Left lower quadrant pain: Secondary | ICD-10-CM

## 2019-03-30 MED ORDER — IOHEXOL 300 MG/ML  SOLN
100.0000 mL | Freq: Once | INTRAMUSCULAR | Status: AC | PRN
  Administered 2019-03-30: 100 mL via INTRAVENOUS

## 2019-03-31 ENCOUNTER — Encounter

## 2019-04-01 ENCOUNTER — Encounter

## 2019-04-05 ENCOUNTER — Telehealth: Payer: Self-pay | Admitting: Nurse Practitioner

## 2019-04-07 ENCOUNTER — Encounter: Payer: Self-pay | Admitting: Physical Therapy

## 2019-04-07 ENCOUNTER — Ambulatory Visit: Payer: Self-pay | Attending: Gastroenterology | Admitting: Physical Therapy

## 2019-04-07 ENCOUNTER — Other Ambulatory Visit: Payer: Self-pay

## 2019-04-07 DIAGNOSIS — R278 Other lack of coordination: Secondary | ICD-10-CM | POA: Insufficient documentation

## 2019-04-07 DIAGNOSIS — M6281 Muscle weakness (generalized): Secondary | ICD-10-CM | POA: Insufficient documentation

## 2019-04-07 NOTE — Patient Instructions (Signed)
Access Code: [email or web address]  URL: [email or web address]  Date: 04/07/2019  Prepared by: Earlie Counts   Exercises Cat-Camel - 10 reps - 1 sets - 1x daily - 7x weekly Child's Pose Stretch - 2 reps - 1 sets - 30 sec hold - 1x daily - 7x weekly [place] - 2 reps - 1 sets - 30 sec hold - 1x daily - 7x weekly Prone Press Up - 5 reps - 1 sets - 1x daily - 7x weekly Supine Diaphragmatic Breathing - 10 reps - 1 sets - 2x daily - 7x weekly Patient Education Trigger [facility] [address], [place], [place] [address] Phone # [phone] Fax [phone]

## 2019-04-07 NOTE — Therapy (Signed)
[facility] [address] [place], [place], [address] Phone: [phone]   Fax:  [phone]  Physical Therapy Treatment  Patient Details  Name: Aimee Figueroa MRN: [id number] Date of Birth: [birth year] Referring Provider (PT): Dr. Harl Bowie   Encounter Date: 04/07/2019  PT End of Session - 04/07/19 1048    Visit Number  2    Date for PT Re-Evaluation  06/13/19    Authorization Type  self pay    PT Start Time  1000    PT Stop Time  1045    PT Time Calculation (min)  45 min    Activity Tolerance  Patient tolerated treatment well;No increased pain    Behavior During Therapy  WFL for tasks assessed/performed       Past Medical History:  Diagnosis Date  . Abnormal Pap smear   . Allergy    seasonal  . Anxiety   . Depression   . Fibromyalgia   . Hyperlipemia   . Low blood pressure   . Pyloric ulcer associated with Helicobacter pylori, acute 05/22/2017   patient not sure if she has an ulcer - first dx  . SVD (spontaneous vaginal delivery)    x 4  . 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 CESAREAN SECTION    . COLONOSCOPY    . GYNECOLOGIC CRYOSURGERY    . LAPAROSCOPIC ASSISTED VAGINAL HYSTERECTOMY N/A 05/26/2017   Procedure: LAPAROSCOPIC ASSISTED VAGINAL HYSTERECTOMY;  Surgeon: [name], MD;  Location: [facility];  Service: Gynecology;  Laterality: N/A;  . LAPAROSCOPIC BILATERAL SALPINGECTOMY Bilateral 05/26/2017   Procedure: LAPAROSCOPIC BILATERAL SALPINGECTOMY;  Surgeon: [name], MD;  Location: [facility];  Service: Gynecology;  Laterality: Bilateral;  . UPPER GI ENDOSCOPY      There were no vitals filed for this visit.  Subjective Assessment - 04/07/19 1004    Subjective  I feel good today. I always feel the pressure in the rectum. When standing upright and still I feel the pressure. d    Patient Stated Goals  help bowel  movement    Currently in Pain?  Yes    Pain Score  4     Pain Location  Abdomen    Pain Orientation  Lower    Pain Descriptors / Indicators  Cramping    Pain Type  Chronic pain    Pain Onset  More than a month ago    Pain Frequency  Intermittent    Aggravating Factors   stress, worried about what is going on, standing    Pain Relieving Factors  eating certain foods    Multiple Pain Sites  No                       [facility] Adult PT Treatment/Exercise - 04/07/19 0001      Manual Therapy   Manual Therapy  Soft tissue mobilization;Myofascial release;Joint mobilization    Joint Mobilization  P-A and rotational mobilization to T8-L5 grade 3, left rib cage mobilization to improve bucket handle motion    Soft tissue mobilization  bil. diaphgram, intercoastals on the left lower rib cage, left latissimus dorsi,left quadratus,  bilateral lumbar paraspinals; circular massage to abdomen to improve peristalic motion of the intestines.     Myofascial Release  left upper quadrant to release the fascial restictions       Trigger Point Dry Needling - 04/07/19  0001    Consent Given?  Yes    Education Handout Provided  Yes    Muscles Treated Back/Hip  Lumbar multifidi   left side of L4   Lumbar multifidi Response  Twitch response elicited;Palpable increased muscle length   pt wanted to stop due to discomfort          PT Education - 04/07/19 1045    Education Details  Access Code: [email or web address]    Person(s) Educated  Patient    Methods  Explanation;Demonstration;Verbal cues;Handout    Comprehension  Returned demonstration;Verbalized understanding       PT Short Term Goals - 03/21/19 1434      PT SHORT TERM GOAL #1   Title  independent with initial HEP    Time  4    Period  Weeks    Status  New    Target Date  04/18/19      PT SHORT TERM GOAL #2   Title  able to relax the internal sphincter with pain level decreased by 25%    Time  4    Period  Weeks    Status  New     Target Date  04/18/19      PT SHORT TERM GOAL #3   Title  improved lower rib cage movement so she is able to perform diaphgramatic breathing to relax the pelvic floor    Time  4    Period  Weeks    Status  New    Target Date  04/18/19      PT SHORT TERM GOAL #4   Title  understand correct toileting with relaxation of the pelvic floor and correct breathing    Time  4    Period  Weeks    Status  New    Target Date  04/18/19      PT SHORT TERM GOAL #5   Title  understand perineal massage to relax the pelvic floor muscles to reduce pain.    Time  4    Period  Weeks    Status  New    Target Date  04/18/19        PT Long Term Goals - 03/21/19 1505      PT LONG TERM GOAL #1   Title  independent with HEP and understand how to progress herself    Time  12    Period  Weeks    Status  New    Target Date  06/13/19      PT LONG TERM GOAL #2   Title  able to have a bowel movement when has the urge and straining decreased >/= 75%    Time  12    Period  Weeks    Status  New    Target Date  06/13/19      PT LONG TERM GOAL #3   Title  stand with pelvic pain and lumbar pain decreased >/= 75% due to improved hip strength >/= 4/5    Time  12    Period  Weeks    Status  New    Target Date  06/13/19      PT LONG TERM GOAL #4   Title  pelvic floor strength >/= 3/5 due to ability to fully relax the pelvic floor and contract due to less trigger points in the pelvic floor muscles    Time  12    Period  Weeks    Status  New    Target Date  06/13/19      PT LONG TERM GOAL #5   Title  able to perform daily activities with pain decreased >/= 75% due to improve fascial restrictions and movement    Time  12    Period  Weeks    Status  New    Target Date  06/13/19            Plan - 04/07/19 1049    Clinical Impression Statement  After therapy patient was not feel pressure in the rectum. Patient has difficulty with moving lumbar spine from flexion to extension with cat cow.  Patient has tightness in left latissimus dori, left rib cage and left quadratus. Patient had improved mobiity and diaphragmatic breathing after treatment. Patient will benefit from skilled therapy to reduce fascial restrictions, improve strength, improve pelvic alignment, lumbar and rib cage movement.    Personal Factors and Comorbidities  Comorbidity 1;Fitness    Comorbidities  hysterectomy, fobromyalgia, laproscopic assisted vaginal hysterectomy    Examination-Activity Limitations  Continence;Stand;Toileting    Stability/Clinical Decision Making  Evolving/Moderate complexity    PT Frequency  1x / week    PT Duration  12 weeks    PT Treatment/Interventions  Biofeedback;Cryotherapy;Electrical Stimulation;Moist Heat;Traction;Therapeutic exercise;Therapeutic activities;Neuromuscular re-education;Patient/family education;Dry needling;Scar mobilization;Manual techniques;Taping;Spinal Manipulations    PT Next Visit Plan  correct ilium, rib mobility, soft tissue work to the c-section scar, spinal mobilization, pelvic floor contraction with resistive hip flexion; abdominal massage handout    PT Home Exercise Plan  Access Code: [email or web address]    Recommended Other Services  MD signed initial summary    Consulted and Agree with Plan of Care  Patient       Patient will benefit from skilled therapeutic intervention in order to improve the following deficits and impairments:  Decreased coordination, Increased fascial restricitons, Decreased endurance, Increased muscle spasms, Decreased activity tolerance, Pain, Decreased strength  Visit Diagnosis: 1. Muscle weakness (generalized)   2. Other lack of coordination        Problem List Patient Active Problem List   Diagnosis Date Noted  . Aphthous ulcer of mouth 10/05/2018  . Gastroesophageal reflux disease without esophagitis 10/05/2018  . Left sided abdominal pain 03/18/2018  . 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 Bloating 03/18/2018  . Rectal  pressure 03/18/2018  . Moderate major depression ([place]) 11/14/2017  . History of colonic polyps 10/29/2017  . Irritable bowel syndrome 10/29/2017  . Postoperative state 05/26/2017  . Helicobacter pylori (H. pylori) infection 05/22/2017  . Hyperlipidemia 05/22/2017  . Degenerative disc disease at L5-S1 level 04/30/2017  . Lumbar disc herniation 04/30/2017  . Spinal stenosis of lumbar region 04/30/2017  . Chronic left-sided low back pain with left-sided sciatica 04/14/2017  . Cephalalgia 09/06/2014  . Skin depigmentation 09/06/2014  . Accessory skin tags 09/06/2014  . High triglycerides 09/06/2014  . Depression 09/06/2014  . Abnormal LFTs 09/06/2014  . AMA (advanced maternal age) multigravida 35+ 10/01/2011  . History of cesarean section 10/01/2011    Earlie Counts, PT 04/07/19 10:53 AM   [facility] [address] [place], [place], [address] Phone: [phone]   Fax:  [phone]  Name: Wadie Mattie MRN: [id number] Date of Birth: 1971-07-26

## 2019-04-14 ENCOUNTER — Ambulatory Visit: Payer: Self-pay | Admitting: Physical Therapy

## 2019-04-15 ENCOUNTER — Other Ambulatory Visit: Payer: Self-pay

## 2019-04-15 ENCOUNTER — Ambulatory Visit: Payer: Self-pay | Attending: Nurse Practitioner | Admitting: Nurse Practitioner

## 2019-04-18 ENCOUNTER — Other Ambulatory Visit: Payer: Self-pay

## 2019-04-18 ENCOUNTER — Ambulatory Visit: Payer: Self-pay | Attending: Family Medicine

## 2019-04-21 ENCOUNTER — Ambulatory Visit: Payer: Self-pay | Admitting: Physical Therapy

## 2019-04-21 ENCOUNTER — Other Ambulatory Visit: Payer: Self-pay

## 2019-04-21 ENCOUNTER — Encounter: Payer: Self-pay | Admitting: Physical Therapy

## 2019-04-21 DIAGNOSIS — M6281 Muscle weakness (generalized): Secondary | ICD-10-CM

## 2019-04-21 DIAGNOSIS — R278 Other lack of coordination: Secondary | ICD-10-CM

## 2019-04-21 NOTE — Therapy (Signed)
[facility] [address] [place], [place], [address] Phone: [phone]   Fax:  [phone]  Physical Therapy Treatment  Patient Details  Name: Aimee Figueroa MRN: [id number] Date of Birth: 12/21/1970 Referring Provider (PT): Dr. Harl Bowie   Encounter Date: 04/21/2019  PT End of Session - 04/21/19 0952    Visit Number  3    Date for PT Re-Evaluation  06/13/19    Authorization Type  self pay    PT Start Time  0906    PT Stop Time  0945    PT Time Calculation (min)  39 min    Activity Tolerance  Patient tolerated treatment well;No increased pain    Behavior During Therapy  WFL for tasks assessed/performed       Past Medical History:  Diagnosis Date  . Abnormal Pap smear   . Allergy    seasonal  . Anxiety   . Depression   . Fibromyalgia   . Hyperlipemia   . Low blood pressure   . Pyloric ulcer associated with Helicobacter pylori, acute 05/22/2017   patient not sure if she has an ulcer - first dx  . SVD (spontaneous vaginal delivery)    x 4  . 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 CESAREAN SECTION    . COLONOSCOPY    . GYNECOLOGIC CRYOSURGERY    . LAPAROSCOPIC ASSISTED VAGINAL HYSTERECTOMY N/A 05/26/2017   Procedure: LAPAROSCOPIC ASSISTED VAGINAL HYSTERECTOMY;  Surgeon: [name], MD;  Location: [facility];  Service: Gynecology;  Laterality: N/A;  . LAPAROSCOPIC BILATERAL SALPINGECTOMY Bilateral 05/26/2017   Procedure: LAPAROSCOPIC BILATERAL SALPINGECTOMY;  Surgeon: [name], MD;  Location: [facility];  Service: Gynecology;  Laterality: Bilateral;  . UPPER GI ENDOSCOPY      There were no vitals filed for this visit.  Subjective Assessment - 04/21/19 0907    Subjective  I feel a difference. I have less pain in the abdomen. I still have the sensation in the rectum including fullness and tightness. When stand still, I feel  something open in my rectum. When I do not empty my bowels I feel tightness in the back.    Patient Stated Goals  help bowel movement         [facility] PT Assessment - 04/21/19 0001      Palpation   SI assessment   pelvis in correct alignment                   [facility] Adult PT Treatment/Exercise - 04/21/19 0001      Self-Care   Self-Care  Other Self-Care Comments    Other Self-Care Comments   education on apps for meditation to work on pain, feeling anxious,      Therapeutic Activites    Therapeutic Activities  Other Therapeutic Activities    Other Therapeutic Activities  education on correct toileting technique with relaxing the anus, diaphragmatic breathing       Lumbar Exercises: Supine   Isometric Hip Flexion  10 reps;5 seconds   each leg to engage abdominals     Manual Therapy   Manual Therapy  Soft tissue mobilization;Myofascial release    Soft tissue mobilization  scar massage along the c-section scar, instructed patient how to perform scar massage,    Myofascial Release  fascial release along the lower quadrant lifting the intestines off bladder, release of the mesenteric root,  PT Education - 04/21/19 0951    Education Details  abdominal massage, toileting, resisted hip flexion, scar massage    Person(s) Educated  Patient    Methods  Explanation;Demonstration;Handout    Comprehension  Returned demonstration;Verbalized understanding       PT Short Term Goals - 04/21/19 0956      PT SHORT TERM GOAL #1   Title  independent with initial HEP    Time  4    Period  Weeks    Status  Achieved    Target Date  04/18/19      PT SHORT TERM GOAL #2   Title  able to relax the internal sphincter with pain level decreased by 25%    Time  4    Period  Weeks    Status  On-going    Target Date  04/18/19      PT SHORT TERM GOAL #3   Title  improved lower rib cage movement so she is able to perform diaphgramatic breathing to relax the pelvic floor     Time  4    Period  Weeks    Status  On-going    Target Date  04/18/19      PT SHORT TERM GOAL #4   Title  understand correct toileting with relaxation of the pelvic floor and correct breathing    Time  4    Period  Weeks    Status  Achieved    Target Date  04/18/19      PT SHORT TERM GOAL #5   Title  understand perineal massage to relax the pelvic floor muscles to reduce pain.    Time  4    Period  Weeks    Status  On-going    Target Date  04/18/19        PT Long Term Goals - 03/21/19 1505      PT LONG TERM GOAL #1   Title  independent with HEP and understand how to progress herself    Time  12    Period  Weeks    Status  New    Target Date  06/13/19      PT LONG TERM GOAL #2   Title  able to have a bowel movement when has the urge and straining decreased >/= 75%    Time  12    Period  Weeks    Status  New    Target Date  06/13/19      PT LONG TERM GOAL #3   Title  stand with pelvic pain and lumbar pain decreased >/= 75% due to improved hip strength >/= 4/5    Time  12    Period  Weeks    Status  New    Target Date  06/13/19      PT LONG TERM GOAL #4   Title  pelvic floor strength >/= 3/5 due to ability to fully relax the pelvic floor and contract due to less trigger points in the pelvic floor muscles    Time  12    Period  Weeks    Status  New    Target Date  06/13/19      PT LONG TERM GOAL #5   Title  able to perform daily activities with pain decreased >/= 75% due to improve fascial restrictions and movement    Time  12    Period  Weeks    Status  New    Target Date  06/13/19  Plan - 04/21/19 0941    Clinical Impression Statement  Patient reports less abdominal pain. Patient able to demonstrate correct toileting technique. Patient has increased tenderness around the C-section scar and tightness. Patient is able to contract her abdominals with resistive hip flexion. Patient is able to demonstrate correct diaphragmatic breathing.  Patient will benefit from skilled therapy to redice fascial restrictions, improve strength, improve lumbar and rib cage movement.    Personal Factors and Comorbidities  Comorbidity 1;Fitness    Comorbidities  hysterectomy, fobromyalgia, laproscopic assisted vaginal hysterectomy    Examination-Activity Limitations  Continence;Stand;Toileting    Stability/Clinical Decision Making  Evolving/Moderate complexity    PT Frequency  1x / week    PT Duration  12 weeks    PT Treatment/Interventions  Biofeedback;Cryotherapy;Electrical Stimulation;Moist Heat;Traction;Therapeutic exercise;Therapeutic activities;Neuromuscular re-education;Patient/family education;Dry needling;Scar mobilization;Manual techniques;Taping;Spinal Manipulations    PT Next Visit Plan  rib mobility, soft tissue work to the c-section scar, spinal mobilization, pelvic floor contraction with hip rotation; work on rectal sphincter contraction; educate on perineal massage    PT Home Exercise Plan  Access Code: [email or web address]    Consulted and Agree with Plan of Care  Patient       Patient will benefit from skilled therapeutic intervention in order to improve the following deficits and impairments:  Decreased coordination, Increased fascial restricitons, Decreased endurance, Increased muscle spasms, Decreased activity tolerance, Pain, Decreased strength  Visit Diagnosis: 1. Muscle weakness (generalized)   2. Other lack of coordination        Problem List Patient Active Problem List   Diagnosis Date Noted  . Aphthous ulcer of mouth 10/05/2018  . Gastroesophageal reflux disease without esophagitis 10/05/2018  . Left sided abdominal pain 03/18/2018  . 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 Bloating 03/18/2018  . Rectal pressure 03/18/2018  . Moderate major depression ([place]) 11/14/2017  . History of colonic polyps 10/29/2017  . Irritable bowel syndrome 10/29/2017  . Postoperative state 05/26/2017  . Helicobacter pylori (H.  pylori) infection 05/22/2017  . Hyperlipidemia 05/22/2017  . Degenerative disc disease at L5-S1 level 04/30/2017  . Lumbar disc herniation 04/30/2017  . Spinal stenosis of lumbar region 04/30/2017  . Chronic left-sided low back pain with left-sided sciatica 04/14/2017  . Cephalalgia 09/06/2014  . Skin depigmentation 09/06/2014  . Accessory skin tags 09/06/2014  . High triglycerides 09/06/2014  . Depression 09/06/2014  . Abnormal LFTs 09/06/2014  . AMA (advanced maternal age) multigravida 35+ 10/01/2011  . History of cesarean section 10/01/2011    Earlie Counts, PT 04/21/19 9:59 AM   [facility] [address] [place], [place], [address] Phone: [phone]   Fax:  [phone]  Name: Aimee Figueroa MRN: [id number] Date of Birth: 1970-10-29

## 2019-04-21 NOTE — Patient Instructions (Addendum)
Using meditation app on phone when in pain including Calm, headspace, curable   Toileting Techniques for Bowel Movements (Defecation) Using your belly (abdomen) and pelvic floor muscles to have a bowel movement is usually instinctive.  Sometimes people can have problems with these muscles and have to relearn proper defecation (emptying) techniques.  If you have weakness in your muscles, organs that are falling out, decreased sensation in your pelvis, or ignore your urge to go, you may find yourself straining to have a bowel movement.  You are straining if you are: . holding your breath or taking in a huge gulp of air and holding it  . keeping your lips and jaw tensed and closed tightly . turning red in the face because of excessive pushing or forcing . developing or worsening your  hemorrhoids . getting faint while pushing . not emptying completely and have to defecate many times a day  If you are straining, you are actually making it harder for yourself to have a bowel movement.  Many people find they are pulling up with the pelvic floor muscles and closing off instead of opening the anus. Due to lack pelvic floor relaxation and coordination the abdominal muscles, one has to work harder to push the feces out.  Many people have never been taught how to defecate efficiently and effectively.  Notice what happens to your body when you are having a bowel movement.  While you are sitting on the toilet pay attention to the following areas: . Jaw and mouth position . Angle of your hips   . Whether your feet touch the ground or not . Arm placement  . Spine position . Waist . Belly tension . Anus (opening of the anal canal)  An Evacuation/Defecation Plan   Here are the 4 basic points:  1. Lean forward enough for your elbows to rest on your knees 2. Support your feet on the floor or use a low stool if your feet don't touch the floor  3. Push out your belly as if you have swallowed a beach ball-you  should feel a widening of your waist 4. Open and relax your pelvic floor muscles, rather than tightening around the anus       The following conditions my require modifications to your toileting posture:  . If you have had surgery in the past that limits your back, hip, pelvic, knee or ankle flexibility . Constipation   Your healthcare practitioner may make the following additional suggestions and adjustments:  1) Sit on the toilet  a) Make sure your feet are supported. b) Notice your hip angle and spine position-most people find it effective to lean forward or raise their knees, which can help the muscles around the anus to relax  c) When you lean forward, place your forearms on your thighs for support  2) Relax suggestions a) Breath deeply in through your nose and out slowly through your mouth as if you are smelling the flowers and blowing out the candles. b) To become aware of how to relax your muscles, contracting and releasing muscles can be helpful.  Pull your pelvic floor muscles in tightly by using the image of holding back gas, or closing around the anus (visualize making a circle smaller) and lifting the anus up and in.  Then release the muscles and your anus should drop down and feel open. Repeat 5 times ending with the feeling of relaxation. c) Keep your pelvic floor muscles relaxed; let your belly bulge out. d)  The digestive tract starts at the mouth and ends at the anal opening, so be sure to relax both ends of the tube.  Place your tongue on the roof of your mouth with your teeth separated.  This helps relax your mouth and will help to relax the anus at the same time.  3) Empty (defecation) a) Keep your pelvic floor and sphincter relaxed, then bulge your anal muscles.  Make the anal opening wide.  b) Stick your belly out as if you have swallowed a beach ball. c) Make your belly wall hard using your belly muscles while continuing to breathe. Doing this makes it easier to  open your anus. d) Breath out and give a grunt (or try using other sounds such as ahhhh, shhhhh, ohhhh or grrrrrrr).  4) Finish a) As you finish your bowel movement, pull the pelvic floor muscles up and in.  This will leave your anus in the proper place rather than remaining pushed out and down. If you leave your anus pushed out and down, it will start to feel as though that is normal and give you incorrect signals about needing to have a bowel movement. About Abdominal Massage  Abdominal massage, also called external colon massage, is a self-treatment circular massage technique that can reduce and eliminate gas and ease constipation. The colon naturally contracts in waves in a clockwise direction starting from inside the right hip, moving up toward the ribs, across the belly, and down inside the left hip.  When you perform circular abdominal massage, you help stimulate your colon's normal wave pattern of movement called peristalsis.  It is most beneficial when done after eating.  Positioning You can practice abdominal massage with oil while lying down, or in the shower with soap.  Some people find that it is just as effective to do the massage through clothing while sitting or standing.  How to Massage Start by placing your finger tips or knuckles on your right side, just inside your hip bone.  . Make small circular movements while you move upward toward your rib cage.   . Once you reach the bottom right side of your rib cage, take your circular movements across to the left side of the bottom of your rib cage.  . Next, move downward until you reach the inside of your left hip bone.  This is the path your feces travel in your colon. . Continue to perform your abdominal massage in this pattern for 10 minutes each day.     You can apply as much pressure as is comfortable in your massage.  Start gently and build pressure as you continue to practice.  Notice any areas of pain as you massage; areas of  slight pain may be relieved as you massage, but if you have areas of significant or intense pain, consult with your healthcare provider.  Other Considerations . General physical activity including bending and stretching can have a beneficial massage-like effect on the colon.  Deep breathing can also stimulate the colon because breathing deeply activates the same nervous system that supplies the colon.   . Abdominal massage should always be used in combination with a bowel-conscious diet that is high in the proper type of fiber for you, fluids (primarily water), and a regular exercise program. [facility] [address] [place], [place] [address] Phone # [phone] Fax [phone]

## 2019-04-28 MED FILL — NORTRIPTYLINE HCL 25 MG CAP: 25 | 30 days supply | Qty: 30 | Fill #1

## 2019-04-28 MED FILL — ATORVASTATIN 20 MG TABLET: 20 | 30 days supply | Qty: 30 | Fill #1

## 2019-05-03 ENCOUNTER — Encounter

## 2019-05-05 ENCOUNTER — Encounter: Payer: Self-pay | Admitting: Physical Therapy

## 2019-05-06 ENCOUNTER — Telehealth: Payer: Self-pay | Admitting: Nurse Practitioner

## 2019-05-11 ENCOUNTER — Ambulatory Visit: Payer: Self-pay | Attending: Nurse Practitioner | Admitting: Physician Assistant

## 2019-05-11 ENCOUNTER — Other Ambulatory Visit: Payer: Self-pay

## 2019-05-11 DIAGNOSIS — J029 Acute pharyngitis, unspecified: Secondary | ICD-10-CM

## 2019-05-11 NOTE — Progress Notes (Signed)
Virtual Visit via Telephone Note  I connected with Aimee Figueroa on 05/11/19 at  3:10 PM EDT by telephone and verified that I am speaking with the correct person using two identifiers.   I discussed the limitations, risks, security and privacy concerns of performing an evaluation and management service by telephone and the availability of in person appointments. I also discussed with the patient that there may be a patient responsible charge related to this service. The patient expressed understanding and agreed to proceed.   Patient location:  home My Location:  [facility] office Persons on the call:  Me and the patient  History of Present Illness: Was seen at an UC on Saturday on [place] and given amoxicillin and prednisone for pharyngitis.  She says she has had this problem on and off for about 2-3 months.  No fevers.  No exposure to Covid.  Appetite is good.  No dysphagia.  She does not feel this has gotten any better and wants to see a specialist.  No N/V/D.      Observations/Objective:  A&Ox3   Assessment and Plan: 1. Pharyngitis, unspecified etiology Go ahead and finish amoxicillin and  Prednisone, salt water gargles.  If improves, can cancel ENT appt.   - Ambulatory referral to ENT   Follow Up Instructions: prn   I discussed the assessment and treatment plan with the patient. The patient was provided an opportunity to ask questions and all were answered. The patient agreed with the plan and demonstrated an understanding of the instructions.   The patient was advised to call back or seek an in-person evaluation if the symptoms worsen or if the condition fails to improve as anticipated.  I provided 13 minutes of non-face-to-face time during this encounter.   Freeman Caldron, PA-C  Patient ID: Aimee Figueroa, female   DOB: 11-10-1970, 48 y.o.   MRN: [id number]

## 2019-05-11 NOTE — Progress Notes (Signed)
Pt. Stated she went to see another clinic for her burning throat pain.   Pt. Stated her stomach pain is much better.  Pt. Was given Amoxcillian 500mg  and Ibuprofen at the other clinic

## 2019-05-12 ENCOUNTER — Ambulatory Visit: Payer: No Typology Code available for payment source | Admitting: Physical Therapy

## 2019-05-17 ENCOUNTER — Encounter: Payer: Self-pay | Admitting: Physical Therapy

## 2019-05-17 ENCOUNTER — Other Ambulatory Visit: Payer: Self-pay

## 2019-05-17 ENCOUNTER — Ambulatory Visit: Payer: No Typology Code available for payment source | Attending: Gastroenterology | Admitting: Physical Therapy

## 2019-05-17 DIAGNOSIS — R278 Other lack of coordination: Secondary | ICD-10-CM

## 2019-05-17 DIAGNOSIS — M6281 Muscle weakness (generalized): Secondary | ICD-10-CM | POA: Insufficient documentation

## 2019-05-17 NOTE — Therapy (Signed)
[facility] [address] [place], [place], [address] Phone: [phone]   Fax:  [phone]  Physical Therapy Treatment  Patient Details  Name: Aimee Figueroa MRN: [id number] Date of Birth: 09-21-1971 Referring Provider (PT): Dr. Harl Bowie   Encounter Date: 05/17/2019  PT End of Session - 05/17/19 1133    Visit Number  4    Date for PT Re-Evaluation  06/13/19    Authorization Type  self pay    PT Start Time  1100    PT Stop Time  1140    PT Time Calculation (min)  40 min    Activity Tolerance  Patient tolerated treatment well;No increased pain    Behavior During Therapy  WFL for tasks assessed/performed       Past Medical History:  Diagnosis Date  . Abnormal Pap smear   . Allergy    seasonal  . Anxiety   . Depression   . Fibromyalgia   . Hyperlipemia   . Low blood pressure   . Pyloric ulcer associated with Helicobacter pylori, acute 05/22/2017   patient not sure if she has an ulcer - first dx  . SVD (spontaneous vaginal delivery)    x 4  . 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 CESAREAN SECTION    . COLONOSCOPY    . GYNECOLOGIC CRYOSURGERY    . LAPAROSCOPIC ASSISTED VAGINAL HYSTERECTOMY N/A 05/26/2017   Procedure: LAPAROSCOPIC ASSISTED VAGINAL HYSTERECTOMY;  Surgeon: [name], MD;  Location: [facility];  Service: Gynecology;  Laterality: N/A;  . LAPAROSCOPIC BILATERAL SALPINGECTOMY Bilateral 05/26/2017   Procedure: LAPAROSCOPIC BILATERAL SALPINGECTOMY;  Surgeon: [name], MD;  Location: [facility];  Service: Gynecology;  Laterality: Bilateral;  . UPPER GI ENDOSCOPY      There were no vitals filed for this visit.  Subjective Assessment - 05/17/19 1105    Subjective  My stomach pain is 30% better. I have less fear.    Patient Stated Goals  help bowel movement    Currently in Pain?  Yes    Pain Score  6     Pain Location   Abdomen    Pain Orientation  Lower    Pain Descriptors / Indicators  Cramping    Pain Type  Chronic pain    Pain Radiating Towards  radiates to the back    Pain Onset  More than a month ago    Pain Frequency  Intermittent    Aggravating Factors   stress, worried about what is going on, standing    Pain Relieving Factors  eating certain foods    Multiple Pain Sites  No         [facility] PT Assessment - 05/17/19 0001      PROM   Right Hip External Rotation   60    Left Hip External Rotation   60      Strength   Right Hip Flexion  5/5    Right Hip External Rotation   5/5    Right Hip Internal Rotation  5/5    Right Hip ABduction  5/5    Left Hip Flexion  5/5    Left Hip External Rotation  5/5    Left Hip Internal Rotation  5/5    Left Hip ABduction  5/5                Pelvic Floor Special Questions -  05/17/19 0001    Urinary Leakage  Yes    Pad use  1   just incase   Activities that cause leaking  Running        [facility] Adult PT Treatment/Exercise - 05/17/19 0001      Lumbar Exercises: Aerobic   Nustep  6 minutes; level 2 while assessing patients      Lumbar Exercises: Supine   Clam  20 reps;1 second   vc on control   Bridge  10 reps;1 second      Lumbar Exercises: Quadruped   Opposite Arm/Leg Raise  Right arm/Left leg;Left arm/Right leg;10 reps;1 second      Manual Therapy   Manual Therapy  Soft tissue mobilization    Soft tissue mobilization  scar massage to the c-section scar             PT Education - 05/17/19 1138    Education Details  Access Code: [email or web address]    Person(s) Educated  Patient    Methods  Explanation;Demonstration;Handout    Comprehension  Verbalized understanding;Returned demonstration       PT Short Term Goals - 05/17/19 1112      PT SHORT TERM GOAL #1   Title  independent with initial HEP    Time  4    Period  Weeks    Status  Achieved    Target Date  04/18/19      PT SHORT TERM GOAL #2   Title  able to relax the  internal sphincter with pain level decreased by 25%    Time  4    Period  Weeks    Status  Achieved    Target Date  04/18/19      PT SHORT TERM GOAL #3   Title  improved lower rib cage movement so she is able to perform diaphgramatic breathing to relax the pelvic floor    Time  4    Period  Weeks    Status  Achieved    Target Date  04/18/19      PT SHORT TERM GOAL #4   Title  understand correct toileting with relaxation of the pelvic floor and correct breathing    Time  4    Period  Weeks    Status  Achieved      PT SHORT TERM GOAL #5   Title  understand perineal massage to relax the pelvic floor muscles to reduce pain.    Time  4    Period  Weeks    Status  Achieved    Target Date  04/18/19        PT Long Term Goals - 03/21/19 1505      PT LONG TERM GOAL #1   Title  independent with HEP and understand how to progress herself    Time  12    Period  Weeks    Status  New    Target Date  06/13/19      PT LONG TERM GOAL #2   Title  able to have a bowel movement when has the urge and straining decreased >/= 75%    Time  12    Period  Weeks    Status  New    Target Date  06/13/19      PT LONG TERM GOAL #3   Title  stand with pelvic pain and lumbar pain decreased >/= 75% due to improved hip strength >/= 4/5    Time  12  Period  Weeks    Status  New    Target Date  06/13/19      PT LONG TERM GOAL #4   Title  pelvic floor strength >/= 3/5 due to ability to fully relax the pelvic floor and contract due to less trigger points in the pelvic floor muscles    Time  12    Period  Weeks    Status  New    Target Date  06/13/19      PT LONG TERM GOAL #5   Title  able to perform daily activities with pain decreased >/= 75% due to improve fascial restrictions and movement    Time  12    Period  Weeks    Status  New    Target Date  06/13/19            Plan - 05/17/19 1111    Clinical Impression Statement  Patient has increased strength in bilateral hips to 5/5.  Patient has improved mobility of c-section scar. Patient has increased trunk sway with lifting opposite arm and leg. Patient reports 30% less pain in the rectum. Patient will leak urine with running but not with sneezing and coughing anymore. Patient has 30% less abdominal pain. Patient is working on her core strength. Patient will benefit from skilled therapy to reduce fascial restrictions, improve strength, improve lumbar and rib cage movement.    Personal Factors and Comorbidities  Comorbidity 1;Fitness    Comorbidities  hysterectomy, fobromyalgia, laproscopic assisted vaginal hysterectomy    Examination-Activity Limitations  Continence;Stand;Toileting    Stability/Clinical Decision Making  Evolving/Moderate complexity    Rehab Potential  Excellent    PT Frequency  1x / week    PT Duration  12 weeks    PT Treatment/Interventions  Biofeedback;Cryotherapy;Electrical Stimulation;Moist Heat;Traction;Therapeutic exercise;Therapeutic activities;Neuromuscular re-education;Patient/family education;Dry needling;Scar mobilization;Manual techniques;Taping;Spinal Manipulations    PT Next Visit Plan  work on rectal sphincter contraction; continue with core strength    PT Home Exercise Plan  Access Code: [email or web address]    Consulted and Agree with Plan of Care  Patient       Patient will benefit from skilled therapeutic intervention in order to improve the following deficits and impairments:  Decreased coordination, Increased fascial restricitons, Decreased endurance, Increased muscle spasms, Decreased activity tolerance, Pain, Decreased strength  Visit Diagnosis: 1. Muscle weakness (generalized)   2. Other lack of coordination        Problem List Patient Active Problem List   Diagnosis Date Noted  . Aphthous ulcer of mouth 10/05/2018  . Gastroesophageal reflux disease without esophagitis 10/05/2018  . Left sided abdominal pain 03/18/2018  . 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 Bloating  03/18/2018  . Rectal pressure 03/18/2018  . Moderate major depression ([place]) 11/14/2017  . History of colonic polyps 10/29/2017  . Irritable bowel syndrome 10/29/2017  . Postoperative state 05/26/2017  . Helicobacter pylori (H. pylori) infection 05/22/2017  . Hyperlipidemia 05/22/2017  . Degenerative disc disease at L5-S1 level 04/30/2017  . Lumbar disc herniation 04/30/2017  . Spinal stenosis of lumbar region 04/30/2017  . Chronic left-sided low back pain with left-sided sciatica 04/14/2017  . Cephalalgia 09/06/2014  . Skin depigmentation 09/06/2014  . Accessory skin tags 09/06/2014  . High triglycerides 09/06/2014  . Depression 09/06/2014  . Abnormal LFTs 09/06/2014  . AMA (advanced maternal age) multigravida 35+ 10/01/2011  . History of cesarean section 10/01/2011    Earlie Counts, PT 05/17/19 11:45 AM    [facility]  [place] [address] [place], [place], [address] Phone: [phone]   Fax:  [phone]  Name: Aimee Figueroa MRN: [id number] Date of Birth: 1971/01/26

## 2019-05-17 NOTE — Patient Instructions (Signed)
Access Code: [email or web address]  URL: [email or web address]  Date: 05/17/2019  Prepared by: Earlie Counts   Exercises Cat-Camel - 10 reps - 1 sets - 1x daily - 7x weekly Child's Pose Stretch - 2 reps - 1 sets - 30 sec hold - 1x daily - 7x weekly [place] - 2 reps - 1 sets - 30 sec hold - 1x daily - 7x weekly Prone Press Up - 5 reps - 1 sets - 1x daily - 7x weekly Supine Diaphragmatic Breathing - 10 reps - 1 sets - 2x daily - 7x weekly Hooklying Isometric Hip Flexion - 10 reps - 1 sets - 5 sec hold - 1x daily - 7x weekly Bent Knee Fallouts - 10 reps - 1 sets - 1x daily - 7x weekly Supine Bridge - 15 reps - 1 sets - 1x daily - 7x weekly Bird Dog - 10 reps - 1 sets - 1x daily - 7x weekly Patient Education Trigger [facility] [address], [place], [place] [address] Phone # [phone] Fax [phone]

## 2019-05-19 ENCOUNTER — Other Ambulatory Visit: Payer: Self-pay | Admitting: Nurse Practitioner

## 2019-05-19 DIAGNOSIS — E785 Hyperlipidemia, unspecified: Secondary | ICD-10-CM

## 2019-05-24 ENCOUNTER — Ambulatory Visit
Admission: RE | Admit: 2019-05-24 | Discharge: 2019-05-24 | Disposition: A | Discharge status: Home / Self Care | Payer: No Typology Code available for payment source | Source: Ambulatory Visit | Attending: Nurse Practitioner | Admitting: Nurse Practitioner

## 2019-05-24 DIAGNOSIS — E785 Hyperlipidemia, unspecified: Secondary | ICD-10-CM

## 2019-05-30 ENCOUNTER — Ambulatory Visit: Payer: No Typology Code available for payment source | Admitting: Physical Therapy

## 2019-05-30 ENCOUNTER — Encounter: Payer: Self-pay | Admitting: Physical Therapy

## 2019-05-30 ENCOUNTER — Other Ambulatory Visit: Payer: Self-pay

## 2019-05-30 DIAGNOSIS — M6281 Muscle weakness (generalized): Secondary | ICD-10-CM

## 2019-05-30 DIAGNOSIS — R278 Other lack of coordination: Secondary | ICD-10-CM

## 2019-05-30 NOTE — Therapy (Signed)
[facility] [address] [place], [place], [address] Phone: [phone]   Fax:  [phone]  Physical Therapy Treatment  Patient Details  Name: Aimee Figueroa MRN: [id number] Date of Birth: 04-30-1971 Referring Provider (PT): Dr. Harl Bowie   Encounter Date: 05/30/2019  PT End of Session - 05/30/19 1121    Visit Number  5    Date for PT Re-Evaluation  06/13/19    Authorization Type  self pay    PT Start Time  1117    PT Stop Time  1155    PT Time Calculation (min)  38 min    Activity Tolerance  Patient tolerated treatment well;No increased pain    Behavior During Therapy  WFL for tasks assessed/performed       Past Medical History:  Diagnosis Date  . Abnormal Pap smear   . Allergy    seasonal  . Anxiety   . Depression   . Fibromyalgia   . Hyperlipemia   . Low blood pressure   . Pyloric ulcer associated with Helicobacter pylori, acute 05/22/2017   patient not sure if she has an ulcer - first dx  . SVD (spontaneous vaginal delivery)    x 4  . 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 CESAREAN SECTION    . COLONOSCOPY    . GYNECOLOGIC CRYOSURGERY    . LAPAROSCOPIC ASSISTED VAGINAL HYSTERECTOMY N/A 05/26/2017   Procedure: LAPAROSCOPIC ASSISTED VAGINAL HYSTERECTOMY;  Surgeon: [name], MD;  Location: [facility];  Service: Gynecology;  Laterality: N/A;  . LAPAROSCOPIC BILATERAL SALPINGECTOMY Bilateral 05/26/2017   Procedure: LAPAROSCOPIC BILATERAL SALPINGECTOMY;  Surgeon: [name], MD;  Location: [facility];  Service: Gynecology;  Laterality: Bilateral;  . UPPER GI ENDOSCOPY      There were no vitals filed for this visit.  Subjective Assessment - 05/30/19 1118    Subjective  The past 2 days have been bad. When I do not empty I get the pain in the abdomen and back pain.  I have been doing my abdominal massage. I hae used the Miralax  and it has helped.    Patient Stated Goals  help bowel movement    Currently in Pain?  Yes    Pain Score  7     Pain Location  Abdomen    Pain Orientation  Lower    Pain Descriptors / Indicators  Cramping    Pain Type  Chronic pain    Pain Radiating Towards  radiates to the back    Pain Onset  More than a month ago    Pain Frequency  Intermittent    Aggravating Factors   stress, worried, standing    Pain Relieving Factors  eating certain foods    Multiple Pain Sites  No                    Pelvic Floor Special Questions - 05/30/19 0001    Pelvic Floor Internal Exam  patient confirms identification and approves PT to assess the pelvic floor and treatment    Exam Type  Rectal    Palpation  tightness of the IAS    Strength  Flicker        [facility] Adult PT Treatment/Exercise - 05/30/19 0001      Self-Care   Self-Care  Other Self-Care Comments    Other Self-Care Comments   using rectal care for  the anal pain, talking to MD to have someone she can talk to about her anxiety; massage the rectal area prior to bowel movement      Neuro Re-ed    Neuro Re-ed Details   Therapist did a body scan with the  patient to start relaxing her body      Lumbar Exercises: Supine   Clam  20 reps;1 second   vc on control   Clam Limitations  using a yellow band      Manual Therapy   Manual Therapy  Internal Pelvic Floor;Soft tissue mobilization    Soft tissue mobilization  gentle massage around the anus to relax the sphincter muscles    Internal Pelvic Floor  attempted to place index finger into the anal canal but as the therapist was pushing her finger past the internal sphincter there was too much pain for patient and increased tightness of the internal sphincter             PT Education - 05/30/19 1149    Education Details  body scan, using rectal care for pain with bowel movements, massaging the anus prior to bowel movement    Person(s) Educated  Patient    Methods   Explanation    Comprehension  Verbalized understanding       PT Short Term Goals - 05/17/19 1112      PT SHORT TERM GOAL #1   Title  independent with initial HEP    Time  4    Period  Weeks    Status  Achieved    Target Date  04/18/19      PT SHORT TERM GOAL #2   Title  able to relax the internal sphincter with pain level decreased by 25%    Time  4    Period  Weeks    Status  Achieved    Target Date  04/18/19      PT SHORT TERM GOAL #3   Title  improved lower rib cage movement so she is able to perform diaphgramatic breathing to relax the pelvic floor    Time  4    Period  Weeks    Status  Achieved    Target Date  04/18/19      PT SHORT TERM GOAL #4   Title  understand correct toileting with relaxation of the pelvic floor and correct breathing    Time  4    Period  Weeks    Status  Achieved      PT SHORT TERM GOAL #5   Title  understand perineal massage to relax the pelvic floor muscles to reduce pain.    Time  4    Period  Weeks    Status  Achieved    Target Date  04/18/19        PT Long Term Goals - 05/30/19 1201      PT LONG TERM GOAL #1   Title  independent with HEP and understand how to progress herself    Time  12    Period  Weeks    Status  On-going      PT LONG TERM GOAL #2   Title  able to have a bowel movement when has the urge and straining decreased >/= 75%    Time  12    Period  Weeks    Status  On-going      PT LONG TERM GOAL #3   Title  stand with pelvic pain and lumbar  pain decreased >/= 75% due to improved hip strength >/= 4/5    Time  12    Period  Weeks    Status  On-going      PT LONG TERM GOAL #4   Title  pelvic floor strength >/= 3/5 due to ability to fully relax the pelvic floor and contract due to less trigger points in the pelvic floor muscles    Time  12    Period  Weeks    Status  On-going      PT LONG TERM GOAL #5   Title  able to perform daily activities with pain decreased >/= 75% due to improve fascial restrictions  and movement    Time  12    Period  Weeks    Status  On-going            Plan - 05/30/19 1124    Clinical Impression Statement  Patient has not urinary leakage. After therapy pain decreased to 5/10. Patient had 2 days of constipation. Patient has a tight IAS. Patient has alot of pain in the anal sphincter. Patient would benefit from talking to someone about all of her worries. Patient will benefit from skilled therapy to reduce fascial restrictions, improve strength, improve lumbar and rib cage movemetn.    Personal Factors and Comorbidities  Comorbidity 1;Fitness    Comorbidities  hysterectomy, fobromyalgia, laproscopic assisted vaginal hysterectomy    Examination-Activity Limitations  Continence;Stand;Toileting    Stability/Clinical Decision Making  Evolving/Moderate complexity    Rehab Potential  Excellent    PT Frequency  1x / week    PT Duration  12 weeks    PT Treatment/Interventions  Biofeedback;Cryotherapy;Electrical Stimulation;Moist Heat;Traction;Therapeutic exercise;Therapeutic activities;Neuromuscular re-education;Patient/family education;Dry needling;Scar mobilization;Manual techniques;Taping;Spinal Manipulations    PT Next Visit Plan  work on rectal sphincter contraction; continue with core strength; write renewal if patient is to continue    PT Home Exercise Plan  Access Code: [email or web address]    Consulted and Agree with Plan of Care  Patient       Patient will benefit from skilled therapeutic intervention in order to improve the following deficits and impairments:  Decreased coordination, Increased fascial restricitons, Decreased endurance, Increased muscle spasms, Decreased activity tolerance, Pain, Decreased strength  Visit Diagnosis: Muscle weakness (generalized)  Other lack of coordination     Problem List Patient Active Problem List   Diagnosis Date Noted  . Aphthous ulcer of mouth 10/05/2018  . Gastroesophageal reflux disease without esophagitis 10/05/2018  .  Left sided abdominal pain 03/18/2018  . 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 Bloating 03/18/2018  . Rectal pressure 03/18/2018  . Moderate major depression ([place]) 11/14/2017  . History of colonic polyps 10/29/2017  . Irritable bowel syndrome 10/29/2017  . Postoperative state 05/26/2017  . Helicobacter pylori (H. pylori) infection 05/22/2017  . Hyperlipidemia 05/22/2017  . Degenerative disc disease at L5-S1 level 04/30/2017  . Lumbar disc herniation 04/30/2017  . Spinal stenosis of lumbar region 04/30/2017  . Chronic left-sided low back pain with left-sided sciatica 04/14/2017  . Cephalalgia 09/06/2014  . Skin depigmentation 09/06/2014  . Accessory skin tags 09/06/2014  . High triglycerides 09/06/2014  . Depression 09/06/2014  . Abnormal LFTs 09/06/2014  . AMA (advanced maternal age) multigravida 35+ 10/01/2011  . History of cesarean section 10/01/2011    Earlie Counts, PT 05/30/19 12:05 PM   [facility] [address] [place], [place], [address] Phone: [phone]   Fax:  [phone]  Name:  Aimee Figueroa MRN: [id number] Date of Birth: [birth year]

## 2019-06-10 ENCOUNTER — Other Ambulatory Visit (HOSPITAL_COMMUNITY): Payer: Self-pay | Admitting: Otolaryngology

## 2019-06-10 DIAGNOSIS — E041 Nontoxic single thyroid nodule: Secondary | ICD-10-CM

## 2019-06-10 DIAGNOSIS — R590 Localized enlarged lymph nodes: Secondary | ICD-10-CM

## 2019-06-13 ENCOUNTER — Other Ambulatory Visit: Payer: Self-pay

## 2019-06-13 ENCOUNTER — Encounter: Payer: Self-pay | Admitting: Physical Therapy

## 2019-06-13 ENCOUNTER — Ambulatory Visit: Payer: No Typology Code available for payment source | Attending: Gastroenterology | Admitting: Physical Therapy

## 2019-06-13 DIAGNOSIS — R278 Other lack of coordination: Secondary | ICD-10-CM | POA: Insufficient documentation

## 2019-06-13 DIAGNOSIS — M6281 Muscle weakness (generalized): Secondary | ICD-10-CM | POA: Insufficient documentation

## 2019-06-13 NOTE — Therapy (Signed)
[facility] [address] [place], [place], [address] Phone: [phone]   Fax:  [phone]  Physical Therapy Treatment  Patient Details  Name: Aimee Figueroa MRN: [id number] Date of Birth: January 18, 1971 Referring Provider (PT): Dr. Harl Bowie   Encounter Date: 06/13/2019  PT End of Session - 06/13/19 0948    Visit Number  6    Date for PT Re-Evaluation  06/13/19    Authorization Type  self pay    PT Start Time  4536   came late   PT Stop Time  1011    PT Time Calculation (min)  23 min    Activity Tolerance  Patient tolerated treatment well;No increased pain    Behavior During Therapy  WFL for tasks assessed/performed       Past Medical History:  Diagnosis Date  . Abnormal Pap smear   . Allergy    seasonal  . Anxiety   . Depression   . Fibromyalgia   . Hyperlipemia   . Low blood pressure   . Pyloric ulcer associated with Helicobacter pylori, acute 05/22/2017   patient not sure if she has an ulcer - first dx  . SVD (spontaneous vaginal delivery)    x 4  . 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 CESAREAN SECTION    . COLONOSCOPY    . GYNECOLOGIC CRYOSURGERY    . LAPAROSCOPIC ASSISTED VAGINAL HYSTERECTOMY N/A 05/26/2017   Procedure: LAPAROSCOPIC ASSISTED VAGINAL HYSTERECTOMY;  Surgeon: [name], MD;  Location: [facility];  Service: Gynecology;  Laterality: N/A;  . LAPAROSCOPIC BILATERAL SALPINGECTOMY Bilateral 05/26/2017   Procedure: LAPAROSCOPIC BILATERAL SALPINGECTOMY;  Surgeon: [name], MD;  Location: [facility];  Service: Gynecology;  Laterality: Bilateral;  . UPPER GI ENDOSCOPY      There were no vitals filed for this visit.      [facility] PT Assessment - 06/13/19 0001      Assessment   Medical Diagnosis  K59.02 Constipation, outlet dysfunction    Referring Provider (PT)  Dr. Harl Bowie    Prior Therapy  none       Precautions   Precautions  None      Restrictions   Weight Bearing Restrictions  No      Home Environment   Living Environment  Private residence      Prior Function   Level of Independence  Independent      Cognition   Overall Cognitive Status  Within Functional Limits for tasks assessed      Posture/Postural Control   Posture/Postural Control  No significant limitations      ROM / Strength   AROM / PROM / Strength  AROM;PROM;Strength      AROM   Lumbar Flexion  full   no pain   Lumbar Extension  full   no pain   Lumbar - Right Side Bend  full   no pain     PROM   Right Hip External Rotation   60    Left Hip External Rotation   60      Strength   Right Hip Flexion  5/5    Right Hip External Rotation   5/5    Right Hip Internal Rotation  5/5    Right Hip ABduction  5/5    Left Hip Flexion  5/5    Left Hip External Rotation  5/5    Left  Hip Internal Rotation  5/5    Left Hip ABduction  5/5      Palpation   SI assessment   pelvis in correct alignment                Pelvic Floor Special Questions - 06/13/19 0001    Urinary Leakage  No        [facility] Adult PT Treatment/Exercise - 06/13/19 0001      Lumbar Exercises: Stretches   Press Ups  5 reps    Quadruped Mid Back Stretch  3 reps;30 seconds   all three ways     Lumbar Exercises: Supine   Isometric Hip Flexion  10 reps;5 seconds    Other Supine Lumbar Exercises  diaphragmatic breathing 10 times with good excursion of her abdomen      Lumbar Exercises: Quadruped   [place] Horse  20 reps   still needs tactile cues   Opposite Arm/Leg Raise  Right arm/Left leg;Left arm/Right leg;10 reps;1 second   tactile cues to go slow and control              PT Short Term Goals - 05/17/19 1112      PT SHORT TERM GOAL #1   Title  independent with initial HEP    Time  4    Period  Weeks    Status  Achieved    Target Date  04/18/19      PT SHORT TERM GOAL #2   Title  able to relax the  internal sphincter with pain level decreased by 25%    Time  4    Period  Weeks    Status  Achieved    Target Date  04/18/19      PT SHORT TERM GOAL #3   Title  improved lower rib cage movement so she is able to perform diaphgramatic breathing to relax the pelvic floor    Time  4    Period  Weeks    Status  Achieved    Target Date  04/18/19      PT SHORT TERM GOAL #4   Title  understand correct toileting with relaxation of the pelvic floor and correct breathing    Time  4    Period  Weeks    Status  Achieved      PT SHORT TERM GOAL #5   Title  understand perineal massage to relax the pelvic floor muscles to reduce pain.    Time  4    Period  Weeks    Status  Achieved    Target Date  04/18/19        PT Long Term Goals - 06/13/19 0949      PT LONG TERM GOAL #1   Title  independent with HEP and understand how to progress herself    Period  Weeks    Status  Achieved      PT LONG TERM GOAL #2   Title  able to have a bowel movement when has the urge and straining decreased >/= 75%    Baseline  not straining this week.    Time  12    Period  Weeks    Status  Achieved      PT LONG TERM GOAL #3   Title  stand with pelvic pain and lumbar pain decreased >/= 75% due to improved hip strength >/= 4/5    Time  12    Period  Weeks    Status  Achieved      PT LONG TERM GOAL #4   Title  pelvic floor strength >/= 3/5 due to ability to fully relax the pelvic floor and contract due to less trigger points in the pelvic floor muscles    Time  12    Period  Weeks    Status  Achieved      PT LONG TERM GOAL #5   Title  able to perform daily activities with pain decreased >/= 75% due to improve fascial restrictions and movement    Time  12    Period  Weeks    Status  Achieved            Plan - 06/13/19 0947    Clinical Impression Statement  Patient lower extremity strength is 5/5 and she has 75% less pain with sitting, standing and daily activities. Patient is able to have a  bowel movement without straining. Patient reports no urinary leakage.Patient is able to demonstrate her HEP correctly. Patient has full lumbar ROM and hip external rotation. Patient is ready for discharge.    Comorbidities  hysterectomy, fobromyalgia, laproscopic assisted vaginal hysterectomy    Examination-Activity Limitations  Continence;Stand;Toileting    Stability/Clinical Decision Making  Evolving/Moderate complexity    Rehab Potential  Excellent    PT Frequency  --    PT Duration  --    PT Treatment/Interventions  Biofeedback;Cryotherapy;Electrical Stimulation;Moist Heat;Traction;Therapeutic exercise;Therapeutic activities;Neuromuscular re-education;Patient/family education;Dry needling;Scar mobilization;Manual techniques;Taping;Spinal Manipulations    PT Next Visit Plan  Discharge to HEP    PT Home Exercise Plan  Access Code: [email or web address]    Consulted and Agree with Plan of Care  Patient       Patient will benefit from skilled therapeutic intervention in order to improve the following deficits and impairments:  Decreased coordination, Increased fascial restricitons, Decreased endurance, Increased muscle spasms, Decreased activity tolerance, Pain, Decreased strength  Visit Diagnosis: Muscle weakness (generalized)  Other lack of coordination     Problem List Patient Active Problem List   Diagnosis Date Noted  . Aphthous ulcer of mouth 10/05/2018  . Gastroesophageal reflux disease without esophagitis 10/05/2018  . Left sided abdominal pain 03/18/2018  . 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 Bloating 03/18/2018  . Rectal pressure 03/18/2018  . Moderate major depression ([place]) 11/14/2017  . History of colonic polyps 10/29/2017  . Irritable bowel syndrome 10/29/2017  . Postoperative state 05/26/2017  . Helicobacter pylori (H. pylori) infection 05/22/2017  . Hyperlipidemia 05/22/2017  . Degenerative disc disease at L5-S1 level 04/30/2017  . Lumbar disc herniation  04/30/2017  . Spinal stenosis of lumbar region 04/30/2017  . Chronic left-sided low back pain with left-sided sciatica 04/14/2017  . Cephalalgia 09/06/2014  . Skin depigmentation 09/06/2014  . Accessory skin tags 09/06/2014  . High triglycerides 09/06/2014  . Depression 09/06/2014  . Abnormal LFTs 09/06/2014  . AMA (advanced maternal age) multigravida 35+ 10/01/2011  . History of cesarean section 10/01/2011    Earlie Counts, PT 06/13/19 10:09 AM   [facility] [address] [place], [place], [address] Phone: [phone]   Fax:  [phone]  Name: Aimee Figueroa MRN: [id number] Date of Birth: 1971-03-17  PHYSICAL THERAPY DISCHARGE SUMMARY  Visits from Start of Care: 6  Current functional level related to goals / functional outcomes: See above.    Remaining deficits: See above.    Education / Equipment: HEP Plan: Patient agrees to discharge.  Patient goals were met. Patient is  being discharged due to meeting the stated rehab goals.  Thank you for the referral. Earlie Counts, PT 06/13/19 10:10 AM  ?????

## 2019-06-14 ENCOUNTER — Ambulatory Visit (HOSPITAL_COMMUNITY)
Admission: RE | Admit: 2019-06-14 | Discharge: 2019-06-14 | Disposition: A | Discharge status: Home / Self Care | Payer: Self-pay | Source: Ambulatory Visit | Attending: Otolaryngology | Admitting: Otolaryngology

## 2019-06-14 ENCOUNTER — Telehealth: Payer: Self-pay | Admitting: Nurse Practitioner

## 2019-06-14 ENCOUNTER — Other Ambulatory Visit: Payer: Self-pay

## 2019-06-14 DIAGNOSIS — E041 Nontoxic single thyroid nodule: Secondary | ICD-10-CM | POA: Insufficient documentation

## 2019-06-14 DIAGNOSIS — R590 Localized enlarged lymph nodes: Secondary | ICD-10-CM | POA: Insufficient documentation

## 2019-06-14 NOTE — Telephone Encounter (Signed)
New Message   Pt came in wanting to know if she was approved for her cafa assistance. Please f/u

## 2019-06-14 NOTE — Telephone Encounter (Signed)
Pt was inform that she was approve for 75% discount, that she need to make sure all her bill has been apply the discount

## 2019-06-15 ENCOUNTER — Other Ambulatory Visit (HOSPITAL_COMMUNITY): Payer: Self-pay | Admitting: Otolaryngology

## 2019-06-15 DIAGNOSIS — E041 Nontoxic single thyroid nodule: Secondary | ICD-10-CM

## 2019-06-15 DIAGNOSIS — R590 Localized enlarged lymph nodes: Secondary | ICD-10-CM

## 2019-06-17 ENCOUNTER — Ambulatory Visit: Payer: Self-pay | Admitting: Nurse Practitioner

## 2019-06-21 ENCOUNTER — Encounter: Payer: Self-pay | Admitting: Gynecology

## 2019-06-23 ENCOUNTER — Other Ambulatory Visit: Payer: Self-pay | Admitting: Radiology

## 2019-06-24 ENCOUNTER — Other Ambulatory Visit: Payer: Self-pay | Admitting: Student

## 2019-06-24 ENCOUNTER — Ambulatory Visit (HOSPITAL_COMMUNITY)
Admission: RE | Admit: 2019-06-24 | Discharge: 2019-06-24 | Disposition: A | Discharge status: Home / Self Care | Payer: No Typology Code available for payment source | Source: Ambulatory Visit | Attending: Otolaryngology | Admitting: Otolaryngology

## 2019-06-24 ENCOUNTER — Ambulatory Visit (HOSPITAL_COMMUNITY): Payer: No Typology Code available for payment source

## 2019-06-27 ENCOUNTER — Encounter (HOSPITAL_COMMUNITY): Payer: Self-pay

## 2019-06-27 ENCOUNTER — Other Ambulatory Visit: Payer: Self-pay

## 2019-06-27 ENCOUNTER — Ambulatory Visit (HOSPITAL_COMMUNITY)
Admission: RE | Admit: 2019-06-27 | Discharge: 2019-06-27 | Disposition: A | Discharge status: Home / Self Care | Payer: No Typology Code available for payment source | Source: Ambulatory Visit | Attending: Otolaryngology | Admitting: Otolaryngology

## 2019-06-27 ENCOUNTER — Other Ambulatory Visit (HOSPITAL_COMMUNITY): Payer: Self-pay | Admitting: Otolaryngology

## 2019-06-27 ENCOUNTER — Ambulatory Visit (HOSPITAL_COMMUNITY)
Admission: RE | Admit: 2019-06-27 | Discharge: 2019-06-27 | Disposition: A | Discharge status: Home / Self Care | Payer: Self-pay | Source: Ambulatory Visit | Attending: Otolaryngology | Admitting: Otolaryngology

## 2019-06-27 DIAGNOSIS — R590 Localized enlarged lymph nodes: Secondary | ICD-10-CM

## 2019-06-27 DIAGNOSIS — E041 Nontoxic single thyroid nodule: Secondary | ICD-10-CM

## 2019-06-27 DIAGNOSIS — Z9079 Acquired absence of other genital organ(s): Secondary | ICD-10-CM | POA: Insufficient documentation

## 2019-06-27 DIAGNOSIS — Z792 Long term (current) use of antibiotics: Secondary | ICD-10-CM | POA: Insufficient documentation

## 2019-06-27 DIAGNOSIS — F419 Anxiety disorder, unspecified: Secondary | ICD-10-CM | POA: Insufficient documentation

## 2019-06-27 DIAGNOSIS — Z886 Allergy status to analgesic agent status: Secondary | ICD-10-CM | POA: Insufficient documentation

## 2019-06-27 DIAGNOSIS — Z8349 Family history of other endocrine, nutritional and metabolic diseases: Secondary | ICD-10-CM | POA: Insufficient documentation

## 2019-06-27 DIAGNOSIS — F329 Major depressive disorder, single episode, unspecified: Secondary | ICD-10-CM | POA: Insufficient documentation

## 2019-06-27 DIAGNOSIS — Z791 Long term (current) use of non-steroidal anti-inflammatories (NSAID): Secondary | ICD-10-CM | POA: Insufficient documentation

## 2019-06-27 DIAGNOSIS — Z833 Family history of diabetes mellitus: Secondary | ICD-10-CM | POA: Insufficient documentation

## 2019-06-27 DIAGNOSIS — M797 Fibromyalgia: Secondary | ICD-10-CM | POA: Insufficient documentation

## 2019-06-27 DIAGNOSIS — Z79899 Other long term (current) drug therapy: Secondary | ICD-10-CM | POA: Insufficient documentation

## 2019-06-27 DIAGNOSIS — Z8049 Family history of malignant neoplasm of other genital organs: Secondary | ICD-10-CM | POA: Insufficient documentation

## 2019-06-27 DIAGNOSIS — E785 Hyperlipidemia, unspecified: Secondary | ICD-10-CM | POA: Insufficient documentation

## 2019-06-27 MED ORDER — LIDOCAINE HCL (PF) 1 % IJ SOLN
INTRAMUSCULAR | Status: AC
  Filled 2019-06-27: qty 30

## 2019-06-27 MED ORDER — SODIUM CHLORIDE 0.9 % IV SOLN
INTRAVENOUS | Status: AC | PRN
  Administered 2019-06-27: 10 mL/h via INTRAVENOUS

## 2019-06-27 MED ORDER — FENTANYL CITRATE (PF) 100 MCG/2ML IJ SOLN
INTRAMUSCULAR | Status: AC
  Filled 2019-06-27: qty 2

## 2019-06-27 MED ORDER — SODIUM CHLORIDE 0.9 % IV SOLN: INTRAVENOUS | Status: DC

## 2019-06-27 MED ORDER — LORAZEPAM 2 MG/ML IJ SOLN
INTRAMUSCULAR | Status: AC | PRN
  Administered 2019-06-27: 1 mg via INTRAVENOUS

## 2019-06-27 MED ORDER — MIDAZOLAM HCL 2 MG/2ML IJ SOLN
INTRAMUSCULAR | Status: AC
  Filled 2019-06-27: qty 2

## 2019-06-27 MED ORDER — FENTANYL CITRATE (PF) 100 MCG/2ML IJ SOLN
INTRAMUSCULAR | Status: AC | PRN
  Administered 2019-06-27: 50 ug via INTRAVENOUS

## 2019-06-27 NOTE — Procedures (Signed)
Interventional Radiology Procedure Note  Procedure: US guided biopsy of suspicious right upper thyroid nodule, and US guided bx of suspicious right cervical lymph node.  FNA performed of both .  Complications: None  Recommendations:  - 1 hr recover - follow up path - advance diet - dc in 1 hr Signed,  Dulcy Fanny. Earleen Newport, DO

## 2019-06-27 NOTE — Discharge Instructions (Signed)

## 2019-06-27 NOTE — Discharge Instructions (Signed)
Biopsia por puncin, cuidados posteriores Needle Biopsy, Care After Esta hoja le brinda informacin sobre cmo cuidarse despus del procedimiento. El mdico tambin podr darle indicaciones ms especficas. Comunquese con su mdico si tiene problemas o preguntas. Qu puedo esperar despus del procedimiento? Despus del procedimiento, es normal tener molestias, hematomas o dolor leve en el lugar de la puncin. Esto debe desaparecer en unos [employer]. Siga estas indicaciones en su casa: Cuidado del lugar de insercin de la [employer] manos con agua y jabn antes de Quarry manager las vendas (vendajes). Use un desinfectante para manos si no dispone de [place] y [place].  Siga las indicaciones del mdico acerca del cuidado del lugar de la puncin. Esto incluye lo siguiente: ? Cundo y cmo [employer]. ? Cundo [employer].  Psychiatric nurse de la puncin todos los das para descartar signos de infeccin. Est atento a los siguientes signos: ? Dolor, hinchazn o enrojecimiento. ? Lquido o sangre. ? Pus o mal olor. ? Calor. Indicaciones generales  Retome sus actividades normales como se lo haya indicado el mdico. Pregntele al mdico qu actividades son seguras para usted.  No tome baos de inmersin, no nade ni use el jacuzzi hasta que el mdico lo autorice. Pregntele al mdico si puede ducharse. Thurston Pounds solo le permitan darse baos de [place].  Tome los medicamentos de venta libre y los recetados solamente como se lo haya indicado el mdico.  Consulting civil engineer a todas las visitas de seguimiento como se lo haya indicado el mdico. Esto es importante. Comunquese con un mdico si:  Tiene fiebre.  Tiene enrojecimiento, hinchazn o Management consultant de la puncin que duran ms de [employer].  Observa lquido, sangre o pus que salen del lugar de la puncin.  El lugar de la puncin se siente caliente al tacto. Solicite ayuda de inmediato si:  Tiene sangrado abundante en el  lugar de la puncin. Resumen  Despus del procedimiento, es normal tener molestias, hematomas o dolor leve en el lugar de la puncin. Esto debe desaparecer en unos [employer].  Controle todos los [employer] lugar de la puncin para Hydrographic surveyor signos de infeccin, como enrojecimiento, hinchazn o Social research officer, government.  Solicite ayuda de inmediato si presenta sangrado intenso proveniente del lugar de la puncin. Esta informacin no tiene Marine scientist el consejo del mdico. Asegrese de hacerle al mdico cualquier pregunta que tenga. Document Released: 01/30/2015 Document Revised: 11/27/2017 Document Reviewed: 11/27/2017 Elsevier Patient Education  2020 [place] en los adultos, cuidados posteriores (Moderate Conscious Sedation, Adult, Care After) Estas indicaciones le proporcionan informacin acerca de cmo deber cuidarse despus del procedimiento. El mdico tambin podr darle instrucciones ms especficas. El tratamiento ha sido planificado segn las prcticas mdicas actuales, pero en algunos casos pueden ocurrir problemas. Comunquese con el mdico si tiene algn problema o dudas despus del procedimiento. QU ESPERAR DESPUS DEL PROCEDIMIENTO Despus del procedimiento, es comn:  Sentirse somnoliento durante varias horas.  Sentirse torpe y [employer] de equilibrio durante varias horas.  Perder el sentido de la realidad durante varias horas.  Vomitar si come [employer]. INSTRUCCIONES PARA EL CUIDADO EN EL HOGAR  Durante al menos 24horas despus del procedimiento:  No haga lo siguiente: ? Participar en actividades que impliquen posibles cadas o lesiones. ? Conducir vehculos. ? Operar maquinarias pesadas. ? Beber alcohol. ? Tomar somnferos o medicamentos que causen somnolencia. ? Firmar documentos legales ni tomar [employer]. ? Cuidar a nios por su  cuenta.  Hacer reposo. Comida y bebida  Siga la dieta recomendada por el mdico.  Si  vomita: ? Pruebe agua, jugo o sopa cuando usted pueda beber sin vomitar. ? Asegrese de no tener nuseas antes de ingerir alimentos slidos. Instrucciones generales  Permanezca con un adulto responsable hasta que est completamente despierto y consciente.  Tome los medicamentos de venta libre y los recetados solamente como se lo haya indicado el mdico.  Si fuma, no lo haga sin supervisin.  Concurra a todas las visitas de control como se lo haya indicado el mdico. Esto es importante. SOLICITE ATENCIN MDICA SI:  Sigue teniendo nuseas o vomitando.  Tiene sensacin de desvanecimiento.  Le aparece una erupcin cutnea.  Tiene fiebre. SOLICITE ATENCIN MDICA DE INMEDIATO SI:  Tiene dificultad para respirar. Esta informacin no tiene Marine scientist el consejo del mdico. Asegrese de hacerle al mdico cualquier pregunta que tenga. Document Released: 09/20/2013 Document Revised: 05/22/2016 Document Reviewed: 01/05/2016 Elsevier Patient Education  [place]. Needle Biopsy, Care After This sheet gives you information about how to care for yourself after your procedure. Your health care provider may also give you more specific instructions. If you have problems or questions, contact your health care provider. What can I expect after the procedure? After the procedure, it is common to have soreness, bruising, or mild pain at the puncture site. This should go away in a few days. Follow these instructions at home: Needle insertion site care   Wash your hands with soap and water before you change your bandage (dressing). If you cannot use soap and water, use hand sanitizer.  Follow instructions from your health care provider about how to take care of your puncture site. This includes: ? When and how to change your dressing. ? When to remove your dressing.  Check your puncture site every day for signs of infection. Check for: ? Redness, swelling, or pain. ? Fluid or  blood. ? Pus or a bad smell. ? Warmth. General instructions  Return to your normal activities as told by your health care provider. Ask your health care provider what activities are safe for you.  Do not take baths, swim, or use a hot tub until your health care provider approves. Ask your health care provider if you may take showers. You may only be allowed to take sponge baths.  Take over-the-counter and prescription medicines only as told by your health care provider.  Keep all follow-up visits as told by your health care provider. This is important. Contact a health care provider if:  You have a fever.  You have redness, swelling, or pain at the puncture site that lasts longer than a few days.  You have fluid, blood, or pus coming from your puncture site.  Your puncture site feels warm to the touch. Get help right away if:  You have severe bleeding from the puncture site. Summary  After the procedure, it is common to have soreness, bruising, or mild pain at the puncture site. This should go away in a few days.  Check your puncture site every day for signs of infection, such as redness, swelling, or pain.  Get help right away if you have severe bleeding from your puncture site. This information is not intended to replace advice given to you by your health care provider. Make sure you discuss any questions you have with your health care provider. Document Released: 01/30/2015 Document Revised: 11/27/2017 Document Reviewed: 09/28/2017 Elsevier Patient Education  2020 [employer].  Moderate Conscious Sedation, Adult, Care After These instructions provide you with information about caring for yourself after your procedure. Your health care provider may also give you more specific instructions. Your treatment has been planned according to current medical practices, but problems sometimes occur. Call your health care provider if you have any problems or questions after your  procedure. What can I expect after the procedure? After your procedure, it is common:  To feel sleepy for several hours.  To feel clumsy and have poor balance for several hours.  To have poor judgment for several hours.  To vomit if you eat too soon. Follow these instructions at home: For at least 24 hours after the procedure:   Do not: ? Participate in activities where you could fall or become injured. ? Drive. ? Use heavy machinery. ? Drink alcohol. ? Take sleeping pills or medicines that cause drowsiness. ? Make important decisions or sign legal documents. ? Take care of children on your own.  Rest. Eating and drinking  Follow the diet recommended by your health care provider.  If you vomit: ? Drink water, juice, or soup when you can drink without vomiting. ? Make sure you have little or no nausea before eating solid foods. General instructions  Have a responsible adult stay with you until you are awake and alert.  Take over-the-counter and prescription medicines only as told by your health care provider.  If you smoke, do not smoke without supervision.  Keep all follow-up visits as told by your health care provider. This is important. Contact a health care provider if:  You keep feeling nauseous or you keep vomiting.  You feel light-headed.  You develop a rash.  You have a fever. Get help right away if:  You have trouble breathing. This information is not intended to replace advice given to you by your health care provider. Make sure you discuss any questions you have with your health care provider. Document Released: 07/06/2013 Document Revised: 08/28/2017 Document Reviewed: 01/05/2016 Elsevier Patient Education  2020 [employer].

## 2019-06-27 NOTE — H&P (Signed)
Chief Complaint: Patient was seen in consultation today for biopsy of right thryoid nodule and LN at the request of Aimee Figueroa  Referring Physician(s): Aimee Figueroa  Supervising Physician: Aimee Figueroa  Patient Status: [facility] - Out-pt  History of Present Illness: Aimee Figueroa is a 48 y.o. female who is found to have suspicious thyroid nodule with adjacent cervical lymphadenopathy. She is referred for US guided FNA of both of these lesions. PMHx, meds, labs, imaging, allergies reviewed. Feels well, no recent fevers, chills, illness. Has been NPO today as directed.   Past Medical History:  Diagnosis Date  . Abnormal Pap smear   . Allergy    seasonal  . Anxiety   . Depression   . Fibromyalgia   . Hyperlipemia   . Low blood pressure   . Pyloric ulcer associated with Helicobacter pylori, acute 05/22/2017   patient not sure if she has an ulcer - first dx  . SVD (spontaneous vaginal delivery)    x 4  . 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 CESAREAN SECTION    . COLONOSCOPY    . GYNECOLOGIC CRYOSURGERY    . LAPAROSCOPIC ASSISTED VAGINAL HYSTERECTOMY N/A 05/26/2017   Procedure: LAPAROSCOPIC ASSISTED VAGINAL HYSTERECTOMY;  Surgeon: [name], MD;  Location: [facility];  Service: Gynecology;  Laterality: N/A;  . LAPAROSCOPIC BILATERAL SALPINGECTOMY Bilateral 05/26/2017   Procedure: LAPAROSCOPIC BILATERAL SALPINGECTOMY;  Surgeon: [name], MD;  Location: [facility];  Service: Gynecology;  Laterality: Bilateral;  . UPPER GI ENDOSCOPY      Allergies: Tylenol [acetaminophen]  Medications: Prior to Admission medications   Medication Sig Start Date End Date Taking? Authorizing Provider  atorvastatin (LIPITOR) 20 MG tablet Take 1 tablet (20 mg total) by mouth daily. 12/07/18  Yes Gildardo Pounds, NP  Cholecalciferol (VITAMIN D3) 2000 units TABS Take 2,000 Units by mouth daily.   Yes  [provider]  dicyclomine (BENTYL) 20 MG tablet Take 1 tablet (20 mg total) by mouth every 6 (six) hours. 01/04/19  Yes Nandigam, Venia Minks, MD  IBUPROFEN PO Take by mouth.   Yes [provider]  omeprazole (PRILOSEC) 40 MG capsule Take 1 capsule (40 mg total) by mouth daily before breakfast. 12/07/18  Yes Gildardo Pounds, NP  amoxicillin (AMOXIL) 500 MG capsule Take 500 mg by mouth 3 (three) times daily.    [provider]  Lidocaine, Anorectal, 5 % GEL Apply to affected area twice daily. 02/01/19   Gildardo Pounds, NP  nortriptyline (PAMELOR) 25 MG capsule Take 1 capsule (25 mg total) by mouth at bedtime. 02/01/19 05/02/19  Gildardo Pounds, NP  omega-3 acid ethyl esters (LOVAZA) 1 g capsule Take 2 capsules (2 g total) by mouth 2 (two) times daily for 30 days. 12/09/18 01/08/19  Gildardo Pounds, NP     Family History  Problem Relation Age of Onset  . Diabetes Father   . Hyperlipidemia Father   . Hyperlipidemia Mother   . Uterine cancer Paternal Aunt   . Ulcerative colitis Cousin   . Anesthesia problems Neg Hx   . Colon cancer Neg Hx   . Rectal cancer Neg Hx   . Esophageal cancer Neg Hx   . Liver cancer Neg Hx     Social History   Socioeconomic History  . Marital status: Married    Spouse name: Not on file  . Number of children: 5  .  Years of education: Not on file  . Highest education level: Not on file  Occupational History  . Occupation: homemaker  Social Needs  . Financial resource strain: Not on file  . Food insecurity    Worry: Not on file    Inability: Not on file  . Transportation needs    Medical: Not on file    Non-medical: Not on file  Tobacco Use  . Smoking status: Never Smoker  . Smokeless tobacco: Never Used  Substance and Sexual Activity  . Alcohol use: No  . Drug use: No  . Sexual activity: Yes    Partners: Male    Birth control/protection: Surgical  Lifestyle  . Physical activity    Days per week: Not on file    Minutes per  session: Not on file  . Stress: Not on file  Relationships  . Social Herbalist on phone: Not on file    Gets together: Not on file    Attends religious service: Not on file    Active member of club or organization: Not on file    Attends meetings of clubs or organizations: Not on file    Relationship status: Not on file  Other Topics Concern  . Not on file  Social History Narrative  . Not on file     Review of Systems: A 12 point ROS discussed and pertinent positives are indicated in the HPI above.  All other systems are negative.  Review of Systems  Vital Signs: BP 122/77   Pulse 84   Temp 97.9 F (36.6 C) (Oral)   Resp 16   Ht 5' (1.524 m)   Wt 67.1 kg   LMP 05/01/2017   SpO2 100%   BMI 28.90 kg/m   Physical Exam Constitutional:      Appearance: Normal appearance.  HENT:     Mouth/Throat:     Mouth: Mucous membranes are moist.     Pharynx: Oropharynx is clear.  Neck:     Musculoskeletal: Normal range of motion and neck supple. No muscular tenderness.  Cardiovascular:     Rate and Rhythm: Normal rate and regular rhythm.     Heart sounds: Normal heart sounds.  Pulmonary:     Effort: Pulmonary effort is normal. No respiratory distress.     Breath sounds: Normal breath sounds.  Lymphadenopathy:     Cervical: No cervical adenopathy.  Skin:    General: Skin is warm and dry.  Neurological:     General: No focal deficit present.     Mental Status: She is alert and oriented to person, place, and time.      Imaging: US Thyroid  Result Date: 06/14/2019 CLINICAL DATA:  Incidental on [place]. Incidentally noted right thyroid nodule and cervical lymphadenopathy and carotid Doppler ultrasound performed 05/24/2019. EXAM: THYROID ULTRASOUND TECHNIQUE: Ultrasound examination of the thyroid gland and adjacent soft tissues was performed. COMPARISON:  Carotid Doppler ultrasound-05/24/2019 FINDINGS: Parenchymal Echotexture: Normal Isthmus: Normal in size measuring 0.4  cm in diameter Right lobe: Borderline enlarged measuring 5.6 x 1.6 x 2.0 cm Left lobe: There is borderline enlarged measuring 5.1 x 1.5 x 1.6 cm _________________________________________________________ Estimated total number of nodules >/= 1 cm: 0 Number of spongiform nodules >/=  2 cm not described below (TR1): 0 Number of mixed cystic and solid nodules >/= 1.5 cm not described below ([place]): 0 _________________________________________________________ Nodule # 1: Location: Right; Superior this nodule correlates with the nodule seen on preceding carotid Doppler ultrasound. Maximum  size: 0.9 cm; Other 2 dimensions: 0.9 x 0.6 cm Composition: solid/almost completely solid (2) Echogenicity: hypoechoic (2) Shape: taller-than-wide (3) Margins: lobulated/irregular (2) Echogenic foci: punctate echogenic foci (3) ACR TI-RADS total points: 12. ACR TI-RADS risk category: TR5 (>/= 7 points). ACR TI-RADS recommendations: **Given size (>/= 1.0 cm) and appearance, fine needle aspiration of this highly suspicious nodule should be considered based on TI-RADS criteria. _________________________________________________________ No discrete nodules are seen within the left lobe of the thyroid Note is made of an abnormal appearing borderline enlarged approximately 1.0 x 0.6 x 1.7 cm right cervical lymph node which is noted contain internal echogenic calcification (representative image 56). IMPRESSION: 1. Solitary nodule within the superior pole of the right lobe of the thyroid, correlating with the nodule seen on preceding carotid Doppler ultrasound, meets imaging criteria to recommend percutaneous sampling as clinically indicated. 2. Borderline enlarged but abnormal appearing adjacent right cervical lymph node, potentially reactive, though worrisome for metastatic disease. This lymph node could also undergo ultrasound-guided biopsy as clinically indicated. The above is in keeping with the ACR TI-RADS recommendations - J Am Coll Radiol  2017;14:587-595. Electronically Signed   By: Sandi Mariscal M.D.   On: 06/14/2019 13:19    Labs:  CBC: Recent Labs    12/07/18 1555  WBC 4.8  HGB 13.8  HCT 39.8  PLT 176    COAGS: No results for input(s): INR, APTT in the last 8760 hours.  BMP: Recent Labs    12/07/18 1555  NA 140  K 3.9  CL 101  CO2 21  GLUCOSE 86  BUN 11  CALCIUM 9.3  CREATININE 0.60  GFRNONAA 110  GFRAA 126    LIVER FUNCTION TESTS: Recent Labs    12/07/18 1555  BILITOT 0.4  AST 32  ALT 49*  ALKPHOS 54  PROT 7.4  ALBUMIN 4.5    TUMOR MARKERS: No results for input(s): AFPTM, CEA, CA199, CHROMGRNA in the last 8760 hours.  Assessment and Plan: (R)thyroid nodule and cervical LN For US guided FNA of each lesion. Risks and benefits of thyroid and LN FNA was discussed with the patient and/or patient's family including, but not limited to bleeding, infection, damage to adjacent structures or low yield requiring additional tests.  All of the questions were answered and there is agreement to proceed.  Consent signed and in chart.     Thank you for this interesting consult.  I greatly enjoyed meeting Aimee Figueroa - [name] and look forward to participating in their care.  A copy of this report was sent to the requesting provider on this date.  Electronically Signed: Ascencion Dike, PA-C 06/27/2019, 7:32 AM   I spent a total of 20 minutes in face to face in clinical consultation, greater than 50% of which was counseling/coordinating care for thyroid and LN biopsy

## 2019-06-28 LAB — CYTOLOGY - NON PAP

## 2019-06-30 ENCOUNTER — Ambulatory Visit (INDEPENDENT_AMBULATORY_CARE_PROVIDER_SITE_OTHER): Payer: No Typology Code available for payment source | Admitting: Otolaryngology

## 2019-07-01 ENCOUNTER — Encounter: Payer: Self-pay | Admitting: Nurse Practitioner

## 2019-07-04 ENCOUNTER — Other Ambulatory Visit: Payer: Self-pay | Admitting: Otolaryngology

## 2019-07-04 DIAGNOSIS — C73 Malignant neoplasm of thyroid gland: Secondary | ICD-10-CM

## 2019-07-11 ENCOUNTER — Other Ambulatory Visit: Payer: Self-pay | Admitting: Otolaryngology

## 2019-07-11 ENCOUNTER — Encounter (HOSPITAL_COMMUNITY): Payer: Self-pay

## 2019-07-13 ENCOUNTER — Encounter: Payer: Self-pay | Admitting: Family Medicine

## 2019-07-13 ENCOUNTER — Other Ambulatory Visit: Payer: Self-pay

## 2019-07-13 ENCOUNTER — Ambulatory Visit: Payer: Self-pay | Attending: Family Medicine | Admitting: Licensed Clinical Social Worker

## 2019-07-13 ENCOUNTER — Ambulatory Visit: Payer: No Typology Code available for payment source | Attending: Family Medicine | Admitting: Family Medicine

## 2019-07-13 VITALS — BP 103/64 | HR 76 | Temp 98.6°F | Resp 18 | Ht 61.0 in | Wt 146.0 lb

## 2019-07-13 DIAGNOSIS — H6593 Unspecified nonsuppurative otitis media, bilateral: Secondary | ICD-10-CM

## 2019-07-13 DIAGNOSIS — R4589 Other symptoms and signs involving emotional state: Secondary | ICD-10-CM

## 2019-07-13 DIAGNOSIS — M62830 Muscle spasm of back: Secondary | ICD-10-CM

## 2019-07-13 DIAGNOSIS — F418 Other specified anxiety disorders: Secondary | ICD-10-CM

## 2019-07-13 DIAGNOSIS — C73 Malignant neoplasm of thyroid gland: Secondary | ICD-10-CM

## 2019-07-13 DIAGNOSIS — H6983 Other specified disorders of Eustachian tube, bilateral: Secondary | ICD-10-CM

## 2019-07-13 DIAGNOSIS — H6993 Unspecified Eustachian tube disorder, bilateral: Secondary | ICD-10-CM

## 2019-07-13 MED ORDER — CETIRIZINE HCL 5 MG PO TABS: ORAL_TABLET | ORAL | 1 refills | Status: DC

## 2019-07-13 MED ORDER — CEFDINIR 300 MG PO CAPS: 300.0000 mg | ORAL_CAPSULE | Freq: Two times a day (BID) | ORAL | 0 refills | Status: DC

## 2019-07-13 MED ORDER — IBUPROFEN 600 MG PO TABS: 600.0000 mg | ORAL_TABLET | Freq: Three times a day (TID) | ORAL | 0 refills | Status: DC | PRN

## 2019-07-13 MED ORDER — CYCLOBENZAPRINE HCL 5 MG PO TABS: ORAL_TABLET | ORAL | 1 refills | Status: AC

## 2019-07-13 MED FILL — CEFDINIR 300 MG CAPSULE: 300 | 10 days supply | Qty: 20 | Fill #0

## 2019-07-13 MED FILL — CYCLOBENZAPRINE 5 MG TABLET: 5 | 5 days supply | Qty: 30 | Fill #0

## 2019-07-13 MED FILL — IBUPROFEN 600 MG TABLET: 600 | 20 days supply | Qty: 60 | Fill #0

## 2019-07-13 NOTE — Progress Notes (Signed)
Established Patient Office Visit  Subjective:  Patient ID: Aimee Figueroa, female    DOB: 03/10/1971  Age: 48 y.o. MRN: [id number]  CC:  Chief Complaint  Patient presents with  . Neck Pain    HPI Aimee Figueroa presents for complaint of bilateral ear pain, right greater than left as well as pain and swelling of her neck with tender/enlarged lymph in the neck area below her right ear. Patient has been diagnosed with thyroid cancer and is afraid that her ear pain and enlarged lymph nose with neck pain and swelling are related to her thyroid caner. She also has two children and is worried about having cancer as well as worried about the upcoming surgery. She has been told that imaging has shown enlarged lymph nodes which might mean that the cancer has spread.  She denies any fever or chills.  No sore throat.  No shortness of breath or cough.  No abdominal pain-no nausea or vomiting.  Past Medical History:  Diagnosis Date  . Abnormal Pap smear   . Allergy    seasonal  . Anxiety   . Depression   . Fibromyalgia   . Hyperlipemia   . Low blood pressure   . Pyloric ulcer associated with Helicobacter pylori, acute 05/22/2017   patient not sure if she has an ulcer - first dx  . SVD (spontaneous vaginal delivery)    x 4  . 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 CESAREAN SECTION    . COLONOSCOPY    . GYNECOLOGIC CRYOSURGERY    . LAPAROSCOPIC ASSISTED VAGINAL HYSTERECTOMY N/A 05/26/2017   Procedure: LAPAROSCOPIC ASSISTED VAGINAL HYSTERECTOMY;  Surgeon: [name], MD;  Location: [facility];  Service: Gynecology;  Laterality: N/A;  . LAPAROSCOPIC BILATERAL SALPINGECTOMY Bilateral 05/26/2017   Procedure: LAPAROSCOPIC BILATERAL SALPINGECTOMY;  Surgeon: [name], MD;  Location: [facility];  Service: Gynecology;  Laterality: Bilateral;  . UPPER GI ENDOSCOPY      Family History  Problem Relation Age of Onset   . Diabetes Father   . Hyperlipidemia Father   . Hyperlipidemia Mother   . Uterine cancer Paternal Aunt   . Ulcerative colitis Cousin   . Anesthesia problems Neg Hx   . Colon cancer Neg Hx   . Rectal cancer Neg Hx   . Esophageal cancer Neg Hx   . Liver cancer Neg Hx     Social History   Socioeconomic History  . Marital status: Married    Spouse name: Not on file  . Number of children: 5  . Years of education: Not on file  . Highest education level: Not on file  Occupational History  . Occupation: homemaker  Social Needs  . Financial resource strain: Not on file  . Food insecurity    Worry: Not on file    Inability: Not on file  . Transportation needs    Medical: Not on file    Non-medical: Not on file  Tobacco Use  . Smoking status: Never Smoker  . Smokeless tobacco: Never Used  Substance and Sexual Activity  . Alcohol use: No  . Drug use: No  . Sexual activity: Yes    Partners: Male    Birth control/protection: Surgical  Lifestyle  . Physical activity    Days per week: Not on file    Minutes per session: Not on file  . Stress: Not on file  Relationships  .  Social Herbalist on phone: Not on file    Gets together: Not on file    Attends religious service: Not on file    Active member of club or organization: Not on file    Attends meetings of clubs or organizations: Not on file    Relationship status: Not on file  . Intimate partner violence    Fear of current or ex partner: Not on file    Emotionally abused: Not on file    Physically abused: Not on file    Forced sexual activity: Not on file  Other Topics Concern  . Not on file  Social History Narrative  . Not on file    Outpatient Medications Prior to Visit  Medication Sig Dispense Refill  . atorvastatin (LIPITOR) 20 MG tablet Take 1 tablet (20 mg total) by mouth daily. 90 tablet 3  . Cholecalciferol (VITAMIN D3) 2000 units TABS Take 2,000 Units by mouth daily.    . IBUPROFEN PO Take by  mouth.    . nortriptyline (PAMELOR) 25 MG capsule Take 1 capsule (25 mg total) by mouth at bedtime. 90 capsule 3  . omega-3 acid ethyl esters (LOVAZA) 1 g capsule Take 2 capsules (2 g total) by mouth 2 (two) times daily for 30 days. 120 capsule 6  . omeprazole (PRILOSEC) 40 MG capsule Take 1 capsule (40 mg total) by mouth daily before breakfast. 90 capsule 1  . dicyclomine (BENTYL) 20 MG tablet Take 1 tablet (20 mg total) by mouth every 6 (six) hours. 120 tablet 3  . Lidocaine, Anorectal, 5 % GEL Apply to affected area twice daily. (Patient not taking: Reported on 07/13/2019) 30 g 1  . amoxicillin (AMOXIL) 500 MG capsule Take 500 mg by mouth 3 (three) times daily.     No facility-administered medications prior to visit.     Allergies  Allergen Reactions  . Tylenol [Acetaminophen] Rash    ROS Review of Systems  Constitutional: Positive for fatigue. Negative for chills and fever.  HENT: Positive for congestion, ear pain, postnasal drip and rhinorrhea. Negative for sore throat and trouble swallowing.   Eyes: Negative for photophobia and visual disturbance.  Respiratory: Negative for cough and shortness of breath.   Cardiovascular: Negative for chest pain, palpitations and leg swelling.  Gastrointestinal: Negative for abdominal pain, constipation, diarrhea and nausea.  Endocrine: Negative for cold intolerance, heat intolerance, polydipsia, polyphagia and polyuria.  Genitourinary: Negative for dysuria and frequency.  Musculoskeletal: Negative for arthralgias and back pain.  Neurological: Negative for dizziness and headaches.  Hematological: Negative for adenopathy. Does not bruise/bleed easily.  Psychiatric/Behavioral: Positive for sleep disturbance (due to worry). Negative for self-injury and suicidal ideas. The patient is nervous/anxious.       Objective:    Physical Exam  Constitutional: She is oriented to person, place, and time. She appears well-developed and well-nourished. No  distress.  WNWD female in NAD but she appears anxious and is at times tearful when talking about her concerns. She is accompanied by her mother at today's visit  HENT:  Head: Normocephalic and atraumatic.  Right Ear: Hearing, external ear and ear canal normal. Tympanic membrane is erythematous.  Left Ear: Hearing, external ear and ear canal normal. A middle ear effusion is present.  Nose: Mucosal edema and rhinorrhea present.  Mouth/Throat: Posterior oropharyngeal edema (mild posterior pharynx edema/erythema and cobblestoning) and posterior oropharyngeal erythema present.  Eyes: Conjunctivae and EOM are normal.  Neck: Normal range of motion. Neck supple. JVD  present. Thyromegaly present.  Mild symmetric thyroid area fullness, area is non-tender; mild enlarged right upper cervical chain lymph node which is slightly tender to palp  Cardiovascular: Normal rate and regular rhythm.  Pulmonary/Chest: Effort normal and breath sounds normal.  Abdominal: Soft. There is no abdominal tenderness. There is no rebound and no guarding.  Musculoskeletal:        General: No tenderness or edema.  Lymphadenopathy:    She has cervical adenopathy.  Neurological: She is alert and oriented to person, place, and time. No cranial nerve deficit.  Skin: Skin is warm and dry.  Psychiatric:  Anxious and tearful at times  Nursing note and vitals reviewed.   BP 103/64 (BP Location: Right Arm, Patient Position: Sitting, Cuff Size: Normal)   Pulse 76   Temp 98.6 F (37 C) (Oral)   Resp 18   Ht 5\' 1"  (1.549 m)   Wt 146 lb (66.2 kg)   LMP 05/01/2017   SpO2 97%   BMI 27.59 kg/m  Wt Readings from Last 3 Encounters:  07/13/19 146 lb (66.2 kg)  06/27/19 148 lb (67.1 kg)  03/16/19 148 lb (67.1 kg)     Health Maintenance Due  Topic Date Due  . INFLUENZA VACCINE  04/30/2019      Lab Results  Component Value Date   TSH 0.776 06/03/2018   Lab Results  Component Value Date   WBC 4.8 12/07/2018   HGB 13.8  12/07/2018   HCT 39.8 12/07/2018   MCV 92 12/07/2018   PLT 176 12/07/2018   Lab Results  Component Value Date   NA 140 12/07/2018   K 3.9 12/07/2018   CO2 21 12/07/2018   GLUCOSE 86 12/07/2018   BUN 11 12/07/2018   CREATININE 0.60 12/07/2018   BILITOT 0.4 12/07/2018   ALKPHOS 54 12/07/2018   AST 32 12/07/2018   ALT 49 (H) 12/07/2018   PROT 7.4 12/07/2018   ALBUMIN 4.5 12/07/2018   CALCIUM 9.3 12/07/2018   ANIONGAP 8 04/07/2017   Lab Results  Component Value Date   CHOL 168 12/07/2018   Lab Results  Component Value Date   HDL 38 (L) 12/07/2018   Lab Results  Component Value Date   LDLCALC 51 12/07/2018   Lab Results  Component Value Date   TRIG 397 (H) 12/07/2018   Lab Results  Component Value Date   CHOLHDL 4.4 12/07/2018   Lab Results  Component Value Date   HGBA1C 5.2 12/07/2018      Assessment & Plan:  1. Bilateral serous otitis media, unspecified chronicity 2. Dysfunction of both eustachian tubes Patient appears to have bilateral serous otitis media and her ear discomfort is also likely occurring due to eustachian tube dysfunction.  Discussed placing patient on Augmentin but she believes that she has recently been on Augmentin or amoxicillin and did not feel that this helped.  Prescription provided for Omnicef 300 mg twice daily x10 days.  Prescription provided for Zyrtec to take at bedtime to help with eustachian tube dysfunction and this may also help with sleep his medication can cause drowsiness.  Patient's sleep issues however appear to be mostly related to her anxiety.  3. Muscle spasm of back She is provided with a refill of Flexeril to take 1 every 8 hours during the daytime to help with muscle spasm of the neck and upper back and she may take up to 2 pills at bedtime to help with muscle spasm/sleep initiation.  She may continue use of ibuprofen  but she did eat prior to taking the medication to help avoid stomach upset.  Ibuprofen refill provided. -  cyclobenzaprine (FLEXERIL) 5 MG tablet; Take on every 8 hours as needed for muscle spasm and up to 2 pills at bedtime (Patient not taking: Reported on 08/05/2019)  Dispense: 30 tablet; Refill: 1  4. Anxiety about health 5. Papillary thyroid carcinoma ([place]) She has recently been diagnosed with papillary thyroid carcinoma for which she will need upcoming surgery.  Patient has children and worries that she may die or have complications from the surgery and/or because of the thyroid cancer.  At today's visit, patient was very tearful at times.  Attempts were made to help patient with her anxiety and patient will also meet with the social worker while she is here in the office for further counseling and resources regarding her anxiety. - Ambulatory referral to Social Work  An After Visit Summary was printed and given to the patient.  Follow-up: Return for as needed with PCP; 2 weeks if not better.  More than 30 minutes of face-to-face time was spent with the patient including time to obtain history of present illness, exam, medical decision making and more than 20 minutes of the time was spent reviewing and explaining patient's diagnosis, the need to discuss her concerns regarding which steps may be needed after surgery as well as counseling regarding anxiety.  Antony Blackbird, MD

## 2019-07-13 NOTE — Progress Notes (Signed)
Patient request a refill on Ibuprofen

## 2019-07-13 NOTE — Patient Instructions (Addendum)
You are being prescribed an antibiotic for your right ear infection and a antihistamine to help with the fluid behind your ear drums. After you complete the antibiotic you can stop the antihistamine as well. You can take the muscle relaxant every 8 hours as needed and up to two pills at bedtime- taking 2 pills will make you very sleepy.  Otitis Media, Adult  Otitis media means that the middle ear is red and swollen (inflamed) and full of fluid. The condition usually goes away on its own. Follow these instructions at home:  Take over-the-counter and prescription medicines only as told by your doctor.  If you were prescribed an antibiotic medicine, take it as told by your doctor. Do not stop taking the antibiotic even if you start to feel better.  Keep all follow-up visits as told by your doctor. This is important. Contact a doctor if:  You have bleeding from your nose.  There is a lump on your neck.  You are not getting better in 5 days.  You feel worse instead of better. Get help right away if:  You have pain that is not helped with medicine.  You have swelling, redness, or pain around your ear.  You get a stiff neck.  You cannot move part of your face (paralyzed).  You notice that the bone behind your ear hurts when you touch it.  You get a very bad headache. Summary  Otitis media means that the middle ear is red, swollen, and full of fluid.  This condition usually goes away on its own. In some cases, treatment may be needed.  If you were prescribed an antibiotic medicine, take it as told by your doctor. This information is not intended to replace advice given to you by your health care provider. Make sure you discuss any questions you have with your health care provider. Document Released: 03/03/2008 Document Revised: 08/28/2017 Document Reviewed: 10/06/2016 Elsevier Patient Education  2020 [place].  Eustachian Tube Dysfunction  Eustachian tube dysfunction refers  to a condition in which a blockage develops in the narrow passage that connects the middle ear to the back of the nose (eustachian tube). The eustachian tube regulates air pressure in the middle ear by letting air move between the ear and nose. It also helps to drain fluid from the middle ear space. Eustachian tube dysfunction can affect one or both ears. When the eustachian tube does not function properly, air pressure, fluid, or both can build up in the middle ear. What are the causes? This condition occurs when the eustachian tube becomes blocked or cannot open normally. Common causes of this condition include:  Ear infections.  Colds and other infections that affect the nose, mouth, and throat (upper respiratory tract).  Allergies.  Irritation from cigarette smoke.  Irritation from stomach acid coming up into the esophagus (gastroesophageal reflux). The esophagus is the tube that carries food from the mouth to the stomach.  Sudden changes in air pressure, such as from descending in an airplane or scuba diving.  Abnormal growths in the nose or throat, such as: ? Growths that line the nose (nasal polyps). ? Abnormal growth of cells (tumors). ? Enlarged tissue at the back of the throat (adenoids). What increases the risk? You are more likely to develop this condition if:  You smoke.  You are overweight.  You are a child who has: ? Certain birth defects of the mouth, such as cleft palate. ? Large tonsils or adenoids. What are the  signs or symptoms? Common symptoms of this condition include:  A feeling of fullness in the ear.  Ear pain.  Clicking or popping noises in the ear.  Ringing in the ear.  Hearing loss.  Loss of balance.  Dizziness. Symptoms may get worse when the air pressure around you changes, such as when you travel to an area of high elevation, fly on an airplane, or go scuba diving. How is this diagnosed? This condition may be diagnosed based on:  Your  symptoms.  A physical exam of your ears, nose, and throat.  Tests, such as those that measure: ? The movement of your eardrum (tympanogram). ? Your hearing (audiometry). How is this treated? Treatment depends on the cause and severity of your condition.  In mild cases, you may relieve your symptoms by moving air into your ears. This is called "popping the ears."  In more severe cases, or if you have symptoms of fluid in your ears, treatment may include: ? Medicines to relieve congestion (decongestants). ? Medicines that treat allergies (antihistamines). ? Nasal sprays or ear drops that contain medicines that reduce swelling (steroids). ? A procedure to drain the fluid in your eardrum (myringotomy). In this procedure, a small tube is placed in the eardrum to:  Drain the fluid.  Restore the air in the middle ear space. ? A procedure to insert a balloon device through the nose to inflate the opening of the eustachian tube (balloon dilation). Follow these instructions at home: Lifestyle  Do not do any of the following until your health care provider approves: ? Travel to high altitudes. ? Fly in airplanes. ? Work in a Pension scheme manager or room. ? Scuba dive.  Do not use any products that contain nicotine or tobacco, such as cigarettes and e-cigarettes. If you need help quitting, ask your health care provider.  Keep your ears dry. Wear fitted earplugs during showering and bathing. Dry your ears completely after. General instructions  Take over-the-counter and prescription medicines only as told by your health care provider.  Use techniques to help pop your ears as recommended by your health care provider. These may include: ? Chewing gum. ? Yawning. ? Frequent, forceful swallowing. ? Closing your mouth, holding your nose closed, and gently blowing as if you are trying to blow air out of your nose.  Keep all follow-up visits as told by your health care provider. This is  important. Contact a health care provider if:  Your symptoms do not go away after treatment.  Your symptoms come back after treatment.  You are unable to pop your ears.  You have: ? A fever. ? Pain in your ear. ? Pain in your head or neck. ? Fluid draining from your ear.  Your hearing suddenly changes.  You become very dizzy.  You lose your balance. Summary  Eustachian tube dysfunction refers to a condition in which a blockage develops in the eustachian tube.  It can be caused by ear infections, allergies, inhaled irritants, or abnormal growths in the nose or throat.  Symptoms include ear pain, hearing loss, or ringing in the ears.  Mild cases are treated with maneuvers to unblock the ears, such as yawning or ear popping.  Severe cases are treated with medicines. Surgery may also be done (rare). This information is not intended to replace advice given to you by your health care provider. Make sure you discuss any questions you have with your health care provider. Document Released: 10/12/2015 Document Revised: 01/05/2018 Document Reviewed:  01/05/2018 Elsevier Patient Education  [place].

## 2019-07-13 NOTE — Progress Notes (Signed)
Integrated Behavioral Health Initial Visit  MRN: [id number] Name: Aimee Figueroa  Number of [place] Clinician visits:: 1/6 Session Start time: 1:35pm  Session End time: 2:00pm Total time: 30 minutes  Type of Service: [place] Interpretor:No. Interpretor Name and Language: N/A   Warm Hand Off Completed per Dr. Chapman Fitch       SUBJECTIVE: Aimee Figueroa is a 48 y.o. female accompanied by Mother Patient was referred by Dr. Chapman Fitch for anxiety. Patient reports the following symptoms/concerns: worry caused by recent cancer diagnosis and upcoming procedure. Duration of problem: 1 month; Severity of problem: moderate  OBJECTIVE: Mood: worried and Affect: Appropriate and Tearful Risk of harm to self or others: No plan to harm self or others  LIFE CONTEXT: Family and Social: Pt has 5 children. One is school-age. Pt has a close relationship with her mother and church community. Per [facility] meeting in 2018, pt resides with her spouse and four minor children.  School/Work: Pt stays home to take care of children and household. Pt receives CFA discount.  Self-Care: Pt is involved in church group that meets weekly Pt takes time for herself by reading scripture, journals, and prays Pt keeps busy caring for her children  Life Changes: Pt was diagnosed with thyroid cancer 1 month ago. Pt is scheduled for surgery to remove thyroid tumor in 1 month. Pt also stated that her cancer has spread to her lymph node and her doctor will order radiation.   Strengths: Pt has good insight Pt is aware of mind/body connection Pt has a strong support network  GOALS ADDRESSED: Patient will: 1. Reduce symptoms of: anxiety and stress 2. Increase knowledge and/or ability of: coping skills and stress reduction  3. Demonstrate ability to: Increase healthy adjustment to current life circumstances  INTERVENTIONS: Interventions utilized:  Brief CBT and Supportive Counseling  Standardized Assessments completed: GAD-7 and PHQ 9  ASSESSMENT: Patient currently experiencing ruminating thoughts of worry related to her recent cancer diagnosis. Pt stated that is aware of the connection between the body and mind. Pt was seen today for an ear infection. She was afraid that her sx were indicative of her cancer spreading, but was relieved to know it was an ear infection. However, she believes that she is making herself physically sick as a result of her anxiety symptoms (i.e., ear infection has resulted from worry). Patient reports she sleeps 6-7 hours per night. She is able to complete household tasks such as cleaning, cooking, and engaging with her children, but is struggling with obsessive thoughts and worry regarding her health. Pt also worries about the livelihood of her children in the case that her cancer treatment is unsuccessful.    Patient may benefit from supportive counseling to cope with her recent diagnosis and upcoming surgery. Pt has a positive support network, including her family (mother) and church. Pt has taken Cymbalta in the past (6 months ago). [facility] is unsure if pt has taken Cymbalta between 6 mo and today's apt, but was prescribed Cymbalta 20mg  today per Dr. Chapman Fitch. Pt expressed interest in therapy; stated preference to begin brief therapy at [facility] next week.   PLAN: 1. Follow up with behavioral health clinician on : 07/18/19 to begin brief therapy with [facility]. 2. Behavioral recommendations: Continue participating in church group, use social support, and begin therapy. 3. Referral(s): [place] (In Clinic) 4. "From scale of 1-10, how likely are you to follow plan?":   Watt Climes K  Schug 07/13/19 3:04pm

## 2019-07-18 ENCOUNTER — Ambulatory Visit: Payer: No Typology Code available for payment source | Admitting: Licensed Clinical Social Worker

## 2019-07-20 ENCOUNTER — Ambulatory Visit (HOSPITAL_BASED_OUTPATIENT_CLINIC_OR_DEPARTMENT_OTHER): Payer: No Typology Code available for payment source | Admitting: Family Medicine

## 2019-07-20 ENCOUNTER — Other Ambulatory Visit: Payer: Self-pay

## 2019-07-20 ENCOUNTER — Encounter: Payer: Self-pay | Admitting: Family Medicine

## 2019-07-20 ENCOUNTER — Ambulatory Visit
Payer: No Typology Code available for payment source | Attending: Nurse Practitioner | Admitting: Licensed Clinical Social Worker

## 2019-07-20 ENCOUNTER — Ambulatory Visit: Payer: No Typology Code available for payment source | Admitting: Licensed Clinical Social Worker

## 2019-07-20 VITALS — BP 116/74 | HR 68 | Temp 99.1°F | Resp 16 | Wt 145.0 lb

## 2019-07-20 DIAGNOSIS — H6993 Unspecified Eustachian tube disorder, bilateral: Secondary | ICD-10-CM

## 2019-07-20 DIAGNOSIS — H6593 Unspecified nonsuppurative otitis media, bilateral: Secondary | ICD-10-CM

## 2019-07-20 DIAGNOSIS — R4589 Other symptoms and signs involving emotional state: Secondary | ICD-10-CM

## 2019-07-20 DIAGNOSIS — H6983 Other specified disorders of Eustachian tube, bilateral: Secondary | ICD-10-CM

## 2019-07-20 DIAGNOSIS — F418 Other specified anxiety disorders: Secondary | ICD-10-CM

## 2019-07-20 MED ORDER — CETIRIZINE HCL 5 MG PO TABS: ORAL_TABLET | ORAL | 1 refills | Status: DC

## 2019-07-20 MED ORDER — CEFDINIR 300 MG PO CAPS: 300.0000 mg | ORAL_CAPSULE | Freq: Two times a day (BID) | ORAL | 0 refills | Status: AC

## 2019-07-20 MED FILL — CEFDINIR 300 MG CAPSULE: 300 | 10 days supply | Qty: 20 | Fill #0

## 2019-07-20 NOTE — Patient Instructions (Signed)

## 2019-07-20 NOTE — Progress Notes (Signed)
Established Patient Office Visit  Subjective:  Patient ID: Aimee Figueroa, female    DOB: 22-Sep-1971  Age: 48 y.o. MRN: [id number]  CC:  Chief Complaint  Patient presents with  . Ear Pain    HPI Aimee Figueroa, 48 year old Hispanic female, who has been diagnosed with thyroid cancer and is scheduled for total thyroidectomy on 08/12/2019 who is seen in follow-up of ear infection.  Patient reports that despite taking the antibiotic she continues to have dull, aching pain in her right ear as well as sensation of fluid behind the left ear, nasal congestion and patient feels as if her neck hurts due to enlarged lymph nodes.  She denies any fever or chills, she does have some frontal headache/facial pressure which is worse with bending forward.  She has also had some dizziness with changes in position of her head.  Dizziness is mild and short in nature.  She denies any sore throat or difficulty swallowing.  She does have some postnasal drainage.  She continues to be very anxious about her cancer diagnosis and upcoming surgery.  Past Medical History:  Diagnosis Date  . Abnormal Pap smear   . Allergy    seasonal  . Anxiety   . Depression   . Fibromyalgia   . Hyperlipemia   . Low blood pressure   . Pyloric ulcer associated with Helicobacter pylori, acute 05/22/2017   patient not sure if she has an ulcer - first dx  . SVD (spontaneous vaginal delivery)    x 4  . 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 CESAREAN SECTION    . COLONOSCOPY    . GYNECOLOGIC CRYOSURGERY    . LAPAROSCOPIC ASSISTED VAGINAL HYSTERECTOMY N/A 05/26/2017   Procedure: LAPAROSCOPIC ASSISTED VAGINAL HYSTERECTOMY;  Surgeon: [name], MD;  Location: [facility];  Service: Gynecology;  Laterality: N/A;  . LAPAROSCOPIC BILATERAL SALPINGECTOMY Bilateral 05/26/2017   Procedure: LAPAROSCOPIC BILATERAL SALPINGECTOMY;  Surgeon: [name], MD;   Location: [facility];  Service: Gynecology;  Laterality: Bilateral;  . UPPER GI ENDOSCOPY      Family History  Problem Relation Age of Onset  . Diabetes Father   . Hyperlipidemia Father   . Hyperlipidemia Mother   . Uterine cancer Paternal Aunt   . Ulcerative colitis Cousin   . Anesthesia problems Neg Hx   . Colon cancer Neg Hx   . Rectal cancer Neg Hx   . Esophageal cancer Neg Hx   . Liver cancer Neg Hx     Social History   Socioeconomic History  . Marital status: Married    Spouse name: Not on file  . Number of children: 5  . Years of education: Not on file  . Highest education level: Not on file  Occupational History  . Occupation: homemaker  Social Needs  . Financial resource strain: Not on file  . Food insecurity    Worry: Not on file    Inability: Not on file  . Transportation needs    Medical: Not on file    Non-medical: Not on file  Tobacco Use  . Smoking status: Never Smoker  . Smokeless tobacco: Never Used  Substance and Sexual Activity  . Alcohol use: No  . Drug use: No  . Sexual activity: Yes    Partners: Male    Birth control/protection: Surgical  Lifestyle  . Physical activity    Days per week: Not  on file    Minutes per session: Not on file  . Stress: Not on file  Relationships  . Social Herbalist on phone: Not on file    Gets together: Not on file    Attends religious service: Not on file    Active member of club or organization: Not on file    Attends meetings of clubs or organizations: Not on file    Relationship status: Not on file  . Intimate partner violence    Fear of current or ex partner: Not on file    Emotionally abused: Not on file    Physically abused: Not on file    Forced sexual activity: Not on file  Other Topics Concern  . Not on file  Social History Narrative  . Not on file    Outpatient Medications Prior to Visit  Medication Sig Dispense Refill  . atorvastatin (LIPITOR) 20 MG tablet Take 1 tablet (20 mg  total) by mouth daily. 90 tablet 3  . cefdinir (OMNICEF) 300 MG capsule Take 1 capsule (300 mg total) by mouth 2 (two) times daily for 10 days. 20 capsule 0  . cetirizine (ZYRTEC) 5 MG tablet Take one at bedtime for congestion for 10 days then as needed 30 tablet 1  . Cholecalciferol (VITAMIN D3) 2000 units TABS Take 2,000 Units by mouth daily.    . cyclobenzaprine (FLEXERIL) 5 MG tablet Take on every 8 hours as needed for muscle spasm and up to 2 pills at bedtime 30 tablet 1  . ibuprofen (ADVIL) 600 MG tablet Take 1 tablet (600 mg total) by mouth every 8 (eight) hours as needed. Take after eating 60 tablet 0  . nortriptyline (PAMELOR) 25 MG capsule Take 1 capsule (25 mg total) by mouth at bedtime. 90 capsule 3  . omega-3 acid ethyl esters (LOVAZA) 1 g capsule Take 2 capsules (2 g total) by mouth 2 (two) times daily for 30 days. 120 capsule 6  . Lidocaine, Anorectal, 5 % GEL Apply to affected area twice daily. (Patient not taking: Reported on 07/13/2019) 30 g 1  . omeprazole (PRILOSEC) 40 MG capsule Take 1 capsule (40 mg total) by mouth daily before breakfast. (Patient not taking: Reported on 07/20/2019) 90 capsule 1   No facility-administered medications prior to visit.     Allergies  Allergen Reactions  . Tylenol [Acetaminophen] Rash    ROS Review of Systems  Constitutional: Positive for fatigue. Negative for chills and fever.  HENT: Positive for congestion, ear pain, postnasal drip, rhinorrhea, sinus pressure and sinus pain. Negative for ear discharge and facial swelling.   Eyes: Negative for photophobia and visual disturbance.  Respiratory: Negative for cough and shortness of breath.   Cardiovascular: Negative for chest pain and leg swelling.  Gastrointestinal: Negative for abdominal pain, constipation, diarrhea and nausea.  Endocrine: Negative for polydipsia, polyphagia and polyuria.  Genitourinary: Negative for dysuria and frequency.  Musculoskeletal: Positive for neck pain.  Negative for arthralgias, back pain and neck stiffness.  Neurological: Positive for dizziness and headaches.  Hematological: Negative for adenopathy. Does not bruise/bleed easily.      Objective:    Physical Exam  Constitutional: She is oriented to person, place, and time. She appears well-developed and well-nourished.  HENT:  Right Ear: Hearing, external ear and ear canal normal.  Left Ear: Hearing, external ear and ear canal normal.  Patient's right TM is pink and slightly thickened and landmarks are not visible.  Patient's left TM is gray/white  but landmarks cannot be visualized and patient has positive fluid/bubbles behind the left lower TM.  Patient with edema and mild erythema of the nasal turbinates with clear mucoid nasal discharge, patient with posterior pharynx edema/erythema and cobblestoning.  Neck: Normal range of motion. Neck supple.  Patient has mild generalized neck fullness, no neck stiffness.  Patient with complaint of some discomfort with palpation along the right anterior and posterior cervical chain but no palpable adenopathy  Cardiovascular: Normal rate and regular rhythm.  Pulmonary/Chest: Effort normal and breath sounds normal.  Abdominal: Soft. There is no abdominal tenderness. There is no rebound and no guarding.  Musculoskeletal:        General: No tenderness or edema.  Neurological: She is alert and oriented to person, place, and time. No cranial nerve deficit.  Skin: Skin is warm and dry.  Psychiatric:  Patient is anxious and tearful at times.  She was easily consolable.  Nursing note and vitals reviewed.   BP 116/74 (BP Location: Left Arm, Patient Position: Sitting, Cuff Size: Normal)   Pulse 68   Temp 99.1 F (37.3 C) (Oral)   Resp 16   Wt 145 lb (65.8 kg)   LMP 05/01/2017   SpO2 98%   BMI 27.40 kg/m  Wt Readings from Last 3 Encounters:  07/20/19 145 lb (65.8 kg)  07/13/19 146 lb (66.2 kg)  06/27/19 148 lb (67.1 kg)     Health Maintenance  Due  Topic Date Due  . INFLUENZA VACCINE  04/30/2019    There are no preventive care reminders to display for this patient.  Lab Results  Component Value Date   TSH 0.776 06/03/2018   Lab Results  Component Value Date   WBC 4.8 12/07/2018   HGB 13.8 12/07/2018   HCT 39.8 12/07/2018   MCV 92 12/07/2018   PLT 176 12/07/2018   Lab Results  Component Value Date   NA 140 12/07/2018   K 3.9 12/07/2018   CO2 21 12/07/2018   GLUCOSE 86 12/07/2018   BUN 11 12/07/2018   CREATININE 0.60 12/07/2018   BILITOT 0.4 12/07/2018   ALKPHOS 54 12/07/2018   AST 32 12/07/2018   ALT 49 (H) 12/07/2018   PROT 7.4 12/07/2018   ALBUMIN 4.5 12/07/2018   CALCIUM 9.3 12/07/2018   ANIONGAP 8 04/07/2017   Lab Results  Component Value Date   CHOL 168 12/07/2018   Lab Results  Component Value Date   HDL 38 (L) 12/07/2018   Lab Results  Component Value Date   LDLCALC 51 12/07/2018   Lab Results  Component Value Date   TRIG 397 (H) 12/07/2018   Lab Results  Component Value Date   CHOLHDL 4.4 12/07/2018   Lab Results  Component Value Date   HGBA1C 5.2 12/07/2018      Assessment & Plan:  1. Bilateral serous otitis media, unspecified chronicity Patient continues to have abnormality of the right TM as it continues to be pink and slightly thickened without visible landmarks.  She also continues to have fluid behind the left TM.  Patient will be placed on Omnicef 300 mg twice daily x10 days and hopefully this should resolve her ear infection. - cefdinir (OMNICEF) 300 MG capsule; Take 1 capsule (300 mg total) by mouth 2 (two) times daily for 10 days.  Dispense: 20 capsule; Refill: 0  2. Dysfunction of both eustachian tubes Patient with continued eustachian tube dysfunction and presence of fluid behind the left TM.  We will have patient take children's  dose cetirizine 5 mg at bedtime to help with nasal congestion and ear pressure. - cetirizine (ZYRTEC) 5 MG tablet; Take one at bedtime for  congestion for 10 days then as needed  Dispense: 30 tablet; Refill: 1  3. Anxiety about health Patient continues to be very anxious about her health and spoke with the patient regarding her health and upcoming anxiety and patient was given reassurance.  Patient also met with medical social work Theatre manager and medical social worker at today's visit regarding patient's anxiety.  An After Visit Summary was printed and given to the patient.   Follow-up: Return in about 2 weeks (around 08/03/2019) for ear infection; sooner if not improving.   Antony Blackbird, MD

## 2019-07-20 NOTE — Progress Notes (Signed)
Integrated Behavioral Health Follow Up Visit  MRN: [id number] Name: Aimee Figueroa [id number] - Aimee Figueroa  Number of [place] Clinician visits: 2/6 Session Start time: 10:00am  Session End time: 10:30am Total time: 30  Type of Service: [place] Interpretor:No. Interpretor Name and Language: N/A  SUBJECTIVE: Aimee Figueroa is a 48 y.o. female accompanied by self Patient was referred by Dr. Chapman Fitch for anxiety. Patient reports the following symptoms/concerns: worry regarding pt's recent cancer diagnosis, fear that cancer is spreading, concern about impact of her health on her family members Duration of problem: 1 month; Severity of problem: moderate  OBJECTIVE: Mood: Anxious and Affect: Appropriate and Tearful Risk of harm to self or others: No plan to harm self or others  LIFE CONTEXT: Family and Social: Pt lives with husband and two youngest children (ages 20 and 76). Pt has 5 children total. Her 3 older children are adults and live independently. Pt has a close relationship with her mom and sister who provide emotional support.   School/Work: Pt cares for her two children at home full time. Pt receives 75% CFA discount. Self-Care: Pt attends a church group and has close relationships with her family members.  Life Changes: Pt was diagnosed with thyroid cancer 1 month ago. Pt has an upcoming surgery to remove her thyroid. Pt was also informed that the cancer has spread to a proximal lymph node.   STRENGTHS: Pt has a strong support network Pt uses faith community and personal relationship as a source of strength Pt has good insight into the negative effects of anxiety on the body and mind.  GOALS ADDRESSED: Patient will: 1.  Reduce symptoms of: anxiety and stress  2.  Increase knowledge and/or ability of: coping skills and stress reduction  3.  Demonstrate ability to: Increase healthy adjustment to current life  circumstances  INTERVENTIONS: Interventions utilized:  Mindfulness or Relaxation Training, Brief CBT and Supportive Counseling Standardized Assessments completed: GAD-7 and PHQ 9  ASSESSMENT: Patient currently experiencing worry and rumination about her recent cancer diagnosis. Pt is concerned that her cancer is spreading. BHI encouraged pt to continue to use supports in place including her family and religious beliefs. BHI provided validation regarding the difficulty of coping with illness. BHI provided psycho education about the importance of processing difficult feelings to manage stress. BHI facilitated a deep breathing exercise with pt in clinic. BHI encouraged pt to practice mindfulness and meditation at home.  Patient may benefit from continued medication management, onging family support, and participation in mindfulness activities to reduce stress. BHI provided meditation resources to pt via e-mail and encouraged pt to try various mindfulness activities including deep breathing and body scans.   PLAN: 1. Follow up with behavioral health clinician on : BHI encouraged pt to schedule a follow up appointment in 1 week 2. Behavioral recommendations: Continue using family support, faith practices, and practice mindfulness activities 3. Referral(s): [place] (In Clinic) 4. "From scale of 1-10, how likely are you to follow plan?":   [place] Intern 07/20/2019 1:56pm

## 2019-07-20 NOTE — Progress Notes (Signed)
Bilateral ear pain.  Worse on the left Pain in lymph nodes. Pain 9/10  Denies drainage

## 2019-07-28 ENCOUNTER — Ambulatory Visit: Payer: No Typology Code available for payment source | Admitting: Licensed Clinical Social Worker

## 2019-07-28 ENCOUNTER — Other Ambulatory Visit: Payer: Self-pay

## 2019-08-03 ENCOUNTER — Ambulatory Visit: Payer: No Typology Code available for payment source | Admitting: Licensed Clinical Social Worker

## 2019-08-05 ENCOUNTER — Ambulatory Visit: Payer: No Typology Code available for payment source | Admitting: Nurse Practitioner

## 2019-08-09 NOTE — Pre-Procedure Instructions (Signed)
[place], [place] [address] [place] [address] Phone: [phone] Fax: [phone]      Your procedure is scheduled on Friday, November 13th.  Report to [facility] Main Entrance "A" at 05:30 A.M., and check in at the Admitting office.  Call this number if you have problems the morning of surgery:  [phone]  Call [phone] if you have any questions prior to your surgery date Monday-Friday 8am-4pm    Remember:  Do not eat or drink after midnight the night before your surgery    Take these medicines the morning of surgery with A SIP OF WATER  cetirizine (ZYRTEC) omeprazole (PRILOSEC)   As of today, STOP taking any Aspirin (unless otherwise instructed by your surgeon), Aleve, Naproxen, Ibuprofen, Motrin, Advil, Goody's, BC's, all herbal medications, fish oil, and all vitamins.    The Morning of Surgery  Do not wear jewelry, make-up or nail polish.  Do not wear lotions, powders, perfumes, or deodorant  Do not shave 48 hours prior to surgery.    Do not bring valuables to the hospital.  [facility] is not responsible for any belongings or valuables.  If you are a smoker, DO NOT Smoke 24 hours prior to surgery IF you wear a CPAP at night please bring your mask, tubing, and machine the morning of surgery   Remember that you must have someone to transport you home after your surgery, and remain with you for 24 hours if you are discharged the same day.   Contacts, glasses, hearing aids, dentures or bridgework may not be worn into surgery.    Leave your suitcase in the car.  After surgery it may be brought to your room.  For patients admitted to the hospital, discharge time will be determined by your treatment team.  Patients discharged the day of surgery will not be allowed to drive home.    Special instructions:   [place]- Preparing For Surgery  Before surgery, you can play an important role. Because  skin is not sterile, your skin needs to be as free of germs as possible. You can reduce the number of germs on your skin by washing with CHG (chlorahexidine gluconate) Soap before surgery.  CHG is an antiseptic cleaner which kills germs and bonds with the skin to continue killing germs even after washing.    Oral Hygiene is also important to reduce your risk of infection.  Remember - BRUSH YOUR TEETH THE MORNING OF SURGERY WITH YOUR REGULAR TOOTHPASTE  Please do not use if you have an allergy to CHG or antibacterial soaps. If your skin becomes reddened/irritated stop using the CHG.  Do not shave (including legs and underarms) for at least 48 hours prior to first CHG shower. It is OK to shave your face.  Please follow these instructions carefully.   1. Shower the NIGHT BEFORE SURGERY and the MORNING OF SURGERY with CHG Soap.   2. If you chose to wash your hair, wash your hair first as usual with your normal shampoo.  3. After you shampoo, rinse your hair and body thoroughly to remove the shampoo.  4. Use CHG as you would any other liquid soap. You can apply CHG directly to the skin and wash gently with a scrungie or a clean washcloth.   5. Apply the CHG Soap to your body ONLY FROM THE NECK DOWN.  Do not use on open wounds or open sores. Avoid contact with your  eyes, ears, mouth and genitals (private parts). Wash Face and genitals (private parts)  with your normal soap.   6. Wash thoroughly, paying special attention to the area where your surgery will be performed.  7. Thoroughly rinse your body with warm water from the neck down.  8. DO NOT shower/wash with your normal soap after using and rinsing off the CHG Soap.  9. Pat yourself dry with a CLEAN TOWEL.  10. Wear CLEAN PAJAMAS to bed the night before surgery, wear comfortable clothes the morning of surgery  11. Place CLEAN SHEETS on your bed the night of your first shower and DO NOT SLEEP WITH PETS.    Day of Surgery:  Do not  apply any deodorants/lotions. Please shower the morning of surgery with the CHG soap  Please wear clean clothes to the hospital/surgery center.   Remember to brush your teeth WITH YOUR REGULAR TOOTHPASTE.   Please read over the following fact sheets that you were given.

## 2019-08-10 ENCOUNTER — Other Ambulatory Visit (HOSPITAL_COMMUNITY)
Admission: RE | Admit: 2019-08-10 | Discharge: 2019-08-10 | Disposition: A | Discharge status: Home / Self Care | Payer: No Typology Code available for payment source | Source: Ambulatory Visit | Attending: Otolaryngology | Admitting: Otolaryngology

## 2019-08-10 ENCOUNTER — Encounter (HOSPITAL_COMMUNITY): Payer: Self-pay

## 2019-08-10 ENCOUNTER — Ambulatory Visit (HOSPITAL_COMMUNITY)
Admission: RE | Admit: 2019-08-10 | Discharge: 2019-08-10 | Disposition: A | Discharge status: Home / Self Care | Payer: No Typology Code available for payment source | Source: Ambulatory Visit | Attending: Anesthesiology | Admitting: Anesthesiology

## 2019-08-10 ENCOUNTER — Other Ambulatory Visit (HOSPITAL_COMMUNITY)
Admission: RE | Admit: 2019-08-10 | Discharge: 2019-08-10 | Disposition: A | Discharge status: Home / Self Care | Payer: Self-pay | Source: Ambulatory Visit | Attending: Otolaryngology | Admitting: Otolaryngology

## 2019-08-10 ENCOUNTER — Other Ambulatory Visit: Payer: Self-pay

## 2019-08-10 ENCOUNTER — Encounter (HOSPITAL_COMMUNITY)
Admission: RE | Admit: 2019-08-10 | Discharge: 2019-08-10 | Disposition: A | Discharge status: Home / Self Care | Payer: No Typology Code available for payment source | Source: Ambulatory Visit | Attending: Otolaryngology | Admitting: Otolaryngology

## 2019-08-10 DIAGNOSIS — Z01812 Encounter for preprocedural laboratory examination: Secondary | ICD-10-CM | POA: Insufficient documentation

## 2019-08-10 DIAGNOSIS — Z01818 Encounter for other preprocedural examination: Secondary | ICD-10-CM

## 2019-08-10 DIAGNOSIS — Z20828 Contact with and (suspected) exposure to other viral communicable diseases: Secondary | ICD-10-CM | POA: Insufficient documentation

## 2019-08-10 NOTE — Progress Notes (Signed)
Scheduled for Covid test today. No h/o sx concerning for Covid.No h/o recent travel. No h/o exposure to Covid positive patients.  PCP - Fleming,Zelda,NP  Cardiologist - denies  Gastroeneterology-Fields,Sandi  Chest x-ray - Today  EKG - denies/NA  Stress Test - denies  ECHO - denies  Cardiac Cath - denies  AICD-denies PM-denies LOOP-denies  Sleep Study - denies CPAP - NA  LABS-CBC,BMP  ASA-denies  ERAS-NA  HA1C-denies Fasting Blood Sugar -  Checks Blood Sugar _____ times a day  Anesthesia-  Pt denies having chest pain, sob, or fever at this time. All instructions explained to the pt, with a verbal understanding of the material. Pt agrees to go over the instructions while at home for a better understanding. Pt also instructed to self quarantine after being tested for COVID-19. The opportunity to ask questions was provided.

## 2019-08-11 ENCOUNTER — Other Ambulatory Visit (HOSPITAL_COMMUNITY)
Admission: RE | Admit: 2019-08-11 | Discharge: 2019-08-11 | Disposition: A | Discharge status: Home / Self Care | Payer: No Typology Code available for payment source | Source: Ambulatory Visit | Attending: Otolaryngology | Admitting: Otolaryngology

## 2019-08-11 DIAGNOSIS — Z01812 Encounter for preprocedural laboratory examination: Secondary | ICD-10-CM | POA: Insufficient documentation

## 2019-08-11 DIAGNOSIS — Z20828 Contact with and (suspected) exposure to other viral communicable diseases: Secondary | ICD-10-CM | POA: Insufficient documentation

## 2019-08-11 LAB — BASIC METABOLIC PANEL
Anion gap: 13 (ref 5–15)
BUN: 11 mg/dL (ref 6–20)
CO2: 22 mmol/L (ref 22–32)
Calcium: 9.3 mg/dL (ref 8.9–10.3)
Chloride: 103 mmol/L (ref 98–111)
Creatinine, Ser: 0.5 mg/dL (ref 0.44–1.00)
GFR calc Af Amer: >60 mL/min (ref >60)
GFR calc non Af Amer: >60 mL/min (ref >60)
Glucose, Bld: 93 mg/dL (ref 70–99)
Potassium: 3.6 mmol/L (ref 3.5–5.1)
Sodium: 138 mmol/L (ref 135–145)

## 2019-08-11 LAB — CBC
HCT: 41.3 % (ref 36.0–46.0)
Hemoglobin: 13.7 g/dL (ref 12.0–15.0)
MCH: 32.2 pg (ref 26.0–34.0)
MCHC: 33.2 g/dL (ref 30.0–36.0)
MCV: 97.2 fL (ref 80.0–100.0)
Platelets: 154 10*3/uL (ref 150–400)
RBC: 4.25 MIL/uL (ref 3.87–5.11)
RDW: 12.3 % (ref 11.5–15.5)
WBC: 3.9 10*3/uL — ABNORMAL LOW (ref 4.0–10.5)
nRBC: 0 % (ref 0.0–0.2)

## 2019-08-11 LAB — SARS CORONAVIRUS 2 (TAT 6-24 HRS): SARS Coronavirus 2: NEGATIVE

## 2019-08-12 ENCOUNTER — Other Ambulatory Visit: Payer: Self-pay

## 2019-08-12 ENCOUNTER — Ambulatory Visit (HOSPITAL_COMMUNITY): Payer: No Typology Code available for payment source | Admitting: Physician Assistant

## 2019-08-12 ENCOUNTER — Ambulatory Visit (HOSPITAL_COMMUNITY): Payer: No Typology Code available for payment source | Admitting: Certified Registered Nurse Anesthetist

## 2019-08-12 ENCOUNTER — Inpatient Hospital Stay (HOSPITAL_COMMUNITY)
Admission: RE | Admit: 2019-08-12 | Discharge: 2019-08-16 | DRG: 626 | Disposition: A | Discharge status: Home / Self Care | Payer: No Typology Code available for payment source | Attending: Otolaryngology | Admitting: Otolaryngology

## 2019-08-12 ENCOUNTER — Encounter (HOSPITAL_COMMUNITY)
Admission: RE | Disposition: A | Discharge status: Home / Self Care | Payer: Self-pay | Source: Home / Self Care | Attending: Otolaryngology

## 2019-08-12 DIAGNOSIS — Z20828 Contact with and (suspected) exposure to other viral communicable diseases: Secondary | ICD-10-CM | POA: Diagnosis present

## 2019-08-12 DIAGNOSIS — C73 Malignant neoplasm of thyroid gland: Principal | ICD-10-CM | POA: Diagnosis present

## 2019-08-12 DIAGNOSIS — Z8611 Personal history of tuberculosis: Secondary | ICD-10-CM

## 2019-08-12 DIAGNOSIS — Z23 Encounter for immunization: Secondary | ICD-10-CM

## 2019-08-12 DIAGNOSIS — E785 Hyperlipidemia, unspecified: Secondary | ICD-10-CM | POA: Diagnosis present

## 2019-08-12 DIAGNOSIS — Z888 Allergy status to other drugs, medicaments and biological substances status: Secondary | ICD-10-CM

## 2019-08-12 DIAGNOSIS — Z8349 Family history of other endocrine, nutritional and metabolic diseases: Secondary | ICD-10-CM

## 2019-08-12 DIAGNOSIS — K5900 Constipation, unspecified: Secondary | ICD-10-CM | POA: Diagnosis present

## 2019-08-12 DIAGNOSIS — K219 Gastro-esophageal reflux disease without esophagitis: Secondary | ICD-10-CM | POA: Diagnosis present

## 2019-08-12 DIAGNOSIS — E892 Postprocedural hypoparathyroidism: Secondary | ICD-10-CM | POA: Diagnosis not present

## 2019-08-12 DIAGNOSIS — C770 Secondary and unspecified malignant neoplasm of lymph nodes of head, face and neck: Secondary | ICD-10-CM | POA: Diagnosis present

## 2019-08-12 DIAGNOSIS — Z833 Family history of diabetes mellitus: Secondary | ICD-10-CM

## 2019-08-12 DIAGNOSIS — E209 Hypoparathyroidism, unspecified: Secondary | ICD-10-CM | POA: Diagnosis not present

## 2019-08-12 HISTORY — PX: THYROIDECTOMY: SHX17

## 2019-08-12 LAB — CREATININE, SERUM
Creatinine, Ser: 0.56 mg/dL (ref 0.44–1.00)
GFR calc Af Amer: >60 mL/min (ref >60)
GFR calc non Af Amer: >60 mL/min (ref >60)

## 2019-08-12 LAB — CBC
HCT: 40.4 % (ref 36.0–46.0)
Hemoglobin: 14.1 g/dL (ref 12.0–15.0)
MCH: 32 pg (ref 26.0–34.0)
MCHC: 34.9 g/dL (ref 30.0–36.0)
MCV: 91.8 fL (ref 80.0–100.0)
Platelets: 162 10*3/uL (ref 150–400)
RBC: 4.4 MIL/uL (ref 3.87–5.11)
RDW: 12 % (ref 11.5–15.5)
WBC: 10.1 10*3/uL (ref 4.0–10.5)
nRBC: 0 % (ref 0.0–0.2)

## 2019-08-12 LAB — CALCIUM: Calcium: 8.9 mg/dL (ref 8.9–10.3)

## 2019-08-12 SURGERY — THYROIDECTOMY
Anesthesia: General | Site: Neck | Laterality: Bilateral

## 2019-08-12 MED ORDER — FENTANYL CITRATE (PF) 250 MCG/5ML IJ SOLN
INTRAMUSCULAR | Status: AC
  Filled 2019-08-12: qty 5

## 2019-08-12 MED ORDER — FENTANYL CITRATE (PF) 100 MCG/2ML IJ SOLN
25.0000 ug | INTRAMUSCULAR | Status: DC | PRN
  Administered 2019-08-12 (×2): 50 ug via INTRAVENOUS

## 2019-08-12 MED ORDER — PHENYLEPHRINE HCL (PRESSORS) 10 MG/ML IV SOLN
INTRAVENOUS | Status: AC
  Filled 2019-08-12: qty 1

## 2019-08-12 MED ORDER — DEXAMETHASONE SODIUM PHOSPHATE 10 MG/ML IJ SOLN
INTRAMUSCULAR | Status: DC | PRN
  Administered 2019-08-12: 5 mg via INTRAVENOUS

## 2019-08-12 MED ORDER — DEXTROSE-NACL 5-0.9 % IV SOLN
INTRAVENOUS | Status: DC
  Administered 2019-08-12 – 2019-08-13 (×2): via INTRAVENOUS

## 2019-08-12 MED ORDER — CEFAZOLIN SODIUM-DEXTROSE 2-3 GM-%(50ML) IV SOLR
INTRAVENOUS | Status: DC | PRN
  Administered 2019-08-12: 2 g via INTRAVENOUS

## 2019-08-12 MED ORDER — SUCCINYLCHOLINE CHLORIDE 200 MG/10ML IV SOSY: PREFILLED_SYRINGE | INTRAVENOUS | Status: DC | PRN

## 2019-08-12 MED ORDER — LIDOCAINE 2% (20 MG/ML) 5 ML SYRINGE
INTRAMUSCULAR | Status: DC | PRN
  Administered 2019-08-12: 60 mg via INTRAVENOUS

## 2019-08-12 MED ORDER — MORPHINE SULFATE (PF) 2 MG/ML IV SOLN
2.0000 mg | INTRAVENOUS | Status: DC | PRN
  Administered 2019-08-12: 2 mg via INTRAVENOUS
  Filled 2019-08-12 (×2): qty 1

## 2019-08-12 MED ORDER — LORATADINE 10 MG PO TABS
10.0000 mg | ORAL_TABLET | Freq: Every day | ORAL | Status: DC
  Administered 2019-08-12 – 2019-08-16 (×5): 10 mg via ORAL
  Filled 2019-08-12 (×5): qty 1

## 2019-08-12 MED ORDER — FENTANYL CITRATE (PF) 250 MCG/5ML IJ SOLN: INTRAMUSCULAR | Status: DC | PRN

## 2019-08-12 MED ORDER — LIDOCAINE-EPINEPHRINE 1 %-1:100000 IJ SOLN
INTRAMUSCULAR | Status: AC
  Filled 2019-08-12: qty 1

## 2019-08-12 MED ORDER — DOCUSATE SODIUM 100 MG PO CAPS
100.0000 mg | ORAL_CAPSULE | Freq: Two times a day (BID) | ORAL | Status: DC
  Administered 2019-08-12 – 2019-08-16 (×9): 100 mg via ORAL
  Filled 2019-08-12 (×9): qty 1

## 2019-08-12 MED ORDER — ACETAMINOPHEN 325 MG PO TABS
650.0000 mg | ORAL_TABLET | Freq: Four times a day (QID) | ORAL | Status: DC | PRN
  Administered 2019-08-12 – 2019-08-16 (×10): 650 mg via ORAL
  Filled 2019-08-12 (×10): qty 2

## 2019-08-12 MED ORDER — CALCIUM CARBONATE ANTACID 500 MG PO CHEW
1.0000 | CHEWABLE_TABLET | Freq: Three times a day (TID) | ORAL | Status: DC
  Administered 2019-08-12 – 2019-08-13 (×2): 200 mg via ORAL
  Filled 2019-08-12 (×2): qty 1

## 2019-08-12 MED ORDER — ONDANSETRON HCL 4 MG PO TABS: 4.0000 mg | ORAL_TABLET | ORAL | Status: DC | PRN

## 2019-08-12 MED ORDER — BACITRACIN-NEOMYCIN-POLYMYXIN OINTMENT TUBE
TOPICAL_OINTMENT | CUTANEOUS | Status: AC
  Filled 2019-08-12: qty 14.17

## 2019-08-12 MED ORDER — ACETAMINOPHEN 10 MG/ML IV SOLN
INTRAVENOUS | Status: AC
  Filled 2019-08-12: qty 100

## 2019-08-12 MED ORDER — ONDANSETRON HCL 4 MG/2ML IJ SOLN
INTRAMUSCULAR | Status: AC
  Filled 2019-08-12: qty 2

## 2019-08-12 MED ORDER — PHENYLEPHRINE 40 MCG/ML (10ML) SYRINGE FOR IV PUSH (FOR BLOOD PRESSURE SUPPORT)
PREFILLED_SYRINGE | INTRAVENOUS | Status: DC | PRN
  Administered 2019-08-12 (×2): 120 ug via INTRAVENOUS

## 2019-08-12 MED ORDER — ACETAMINOPHEN 10 MG/ML IV SOLN
1000.0000 mg | Freq: Once | INTRAVENOUS | Status: DC | PRN
  Administered 2019-08-12: 1000 mg via INTRAVENOUS

## 2019-08-12 MED ORDER — 0.9 % SODIUM CHLORIDE (POUR BTL) OPTIME
TOPICAL | Status: DC | PRN
  Administered 2019-08-12: 1000 mL

## 2019-08-12 MED ORDER — PROPOFOL 10 MG/ML IV BOLUS
INTRAVENOUS | Status: DC | PRN
  Administered 2019-08-12: 140 mg via INTRAVENOUS

## 2019-08-12 MED ORDER — OXYCODONE HCL 5 MG PO TABS
5.0000 mg | ORAL_TABLET | ORAL | Status: DC | PRN
  Administered 2019-08-12 – 2019-08-16 (×14): 5 mg via ORAL
  Filled 2019-08-12 (×16): qty 1

## 2019-08-12 MED ORDER — SUCCINYLCHOLINE CHLORIDE 20 MG/ML IJ SOLN
INTRAMUSCULAR | Status: DC | PRN
  Administered 2019-08-12: 80 mg via INTRAVENOUS

## 2019-08-12 MED ORDER — PANTOPRAZOLE SODIUM 40 MG PO TBEC
40.0000 mg | DELAYED_RELEASE_TABLET | Freq: Every day | ORAL | Status: DC
  Administered 2019-08-12 – 2019-08-13 (×2): 40 mg via ORAL
  Filled 2019-08-12 (×3): qty 1

## 2019-08-12 MED ORDER — PHENYLEPHRINE HCL-NACL 10-0.9 MG/250ML-% IV SOLN
INTRAVENOUS | Status: DC | PRN
  Administered 2019-08-12: 120 ug/min via INTRAVENOUS

## 2019-08-12 MED ORDER — PRAVASTATIN SODIUM 10 MG PO TABS
10.0000 mg | ORAL_TABLET | Freq: Every day | ORAL | Status: DC
  Administered 2019-08-12 – 2019-08-15 (×4): 10 mg via ORAL
  Filled 2019-08-12 (×4): qty 1

## 2019-08-12 MED ORDER — PHENOL 1.4 % MT LIQD: 1.0000 | OROMUCOSAL | Status: DC | PRN

## 2019-08-12 MED ORDER — PROPOFOL 10 MG/ML IV BOLUS
INTRAVENOUS | Status: AC
  Filled 2019-08-12: qty 40

## 2019-08-12 MED ORDER — ENOXAPARIN SODIUM 40 MG/0.4ML ~~LOC~~ SOLN
40.0000 mg | SUBCUTANEOUS | Status: DC
  Administered 2019-08-13 – 2019-08-16 (×4): 40 mg via SUBCUTANEOUS
  Filled 2019-08-12 (×4): qty 0.4

## 2019-08-12 MED ORDER — LIDOCAINE HCL (CARDIAC) PF 100 MG/5ML IV SOSY: PREFILLED_SYRINGE | INTRAVENOUS | Status: DC | PRN

## 2019-08-12 MED ORDER — LIDOCAINE-EPINEPHRINE 1 %-1:100000 IJ SOLN
INTRAMUSCULAR | Status: DC | PRN
  Administered 2019-08-12: 5 mL

## 2019-08-12 MED ORDER — SODIUM CHLORIDE 0.9 % IV SOLN
0.0500 ug/kg/min | INTRAVENOUS | Status: AC
  Administered 2019-08-12: 0.15 ug/kg/min via INTRAVENOUS
  Filled 2019-08-12: qty 5000

## 2019-08-12 MED ORDER — SUCCINYLCHOLINE CHLORIDE 200 MG/10ML IV SOSY
PREFILLED_SYRINGE | INTRAVENOUS | Status: AC
  Filled 2019-08-12: qty 10

## 2019-08-12 MED ORDER — MIDAZOLAM HCL 2 MG/2ML IJ SOLN
INTRAMUSCULAR | Status: DC | PRN
  Administered 2019-08-12: 2 mg via INTRAVENOUS

## 2019-08-12 MED ORDER — MIDAZOLAM HCL 2 MG/2ML IJ SOLN
INTRAMUSCULAR | Status: AC
  Filled 2019-08-12: qty 2

## 2019-08-12 MED ORDER — ONDANSETRON HCL 4 MG/2ML IJ SOLN
INTRAMUSCULAR | Status: DC | PRN
  Administered 2019-08-12: 4 mg via INTRAVENOUS

## 2019-08-12 MED ORDER — PHENYLEPHRINE 40 MCG/ML (10ML) SYRINGE FOR IV PUSH (FOR BLOOD PRESSURE SUPPORT)
PREFILLED_SYRINGE | INTRAVENOUS | Status: AC
  Filled 2019-08-12: qty 10

## 2019-08-12 MED ORDER — VITAMIN C 500 MG PO TABS
500.0000 mg | ORAL_TABLET | Freq: Every day | ORAL | Status: DC
  Administered 2019-08-12 – 2019-08-16 (×5): 500 mg via ORAL
  Filled 2019-08-12 (×5): qty 1

## 2019-08-12 MED ORDER — OXYCODONE HCL 5 MG PO TABS: 5.0000 mg | ORAL_TABLET | Freq: Once | ORAL | Status: DC | PRN

## 2019-08-12 MED ORDER — LACTATED RINGERS IV SOLN
INTRAVENOUS | Status: DC | PRN
  Administered 2019-08-12: 07:00:00 via INTRAVENOUS

## 2019-08-12 MED ORDER — OXYCODONE HCL 5 MG/5ML PO SOLN: 5.0000 mg | Freq: Once | ORAL | Status: DC | PRN

## 2019-08-12 MED ORDER — CHOLECALCIFEROL 10 MCG (400 UNIT) PO TABS
400.0000 [IU] | ORAL_TABLET | Freq: Every day | ORAL | Status: DC
  Administered 2019-08-12: 400 [IU] via ORAL
  Filled 2019-08-12 (×2): qty 1

## 2019-08-12 MED ORDER — ACETAMINOPHEN 500 MG PO TABS: 1000.0000 mg | ORAL_TABLET | Freq: Once | ORAL | Status: DC | PRN

## 2019-08-12 MED ORDER — ACETAMINOPHEN 160 MG/5ML PO SOLN: 1000.0000 mg | Freq: Once | ORAL | Status: DC | PRN

## 2019-08-12 MED ORDER — FENTANYL CITRATE (PF) 100 MCG/2ML IJ SOLN
INTRAMUSCULAR | Status: AC
  Filled 2019-08-12: qty 2

## 2019-08-12 MED ORDER — PROPOFOL 10 MG/ML IV BOLUS: INTRAVENOUS | Status: DC | PRN

## 2019-08-12 MED ORDER — FENTANYL CITRATE (PF) 250 MCG/5ML IJ SOLN
INTRAMUSCULAR | Status: DC | PRN
  Administered 2019-08-12: 100 ug via INTRAVENOUS
  Administered 2019-08-12: 50 ug via INTRAVENOUS

## 2019-08-12 MED ORDER — DEXAMETHASONE SODIUM PHOSPHATE 10 MG/ML IJ SOLN
INTRAMUSCULAR | Status: AC
  Filled 2019-08-12: qty 1

## 2019-08-12 MED ORDER — ONDANSETRON HCL 4 MG/2ML IJ SOLN: 4.0000 mg | INTRAMUSCULAR | Status: DC | PRN

## 2019-08-12 MED ORDER — LIDOCAINE 2% (20 MG/ML) 5 ML SYRINGE
INTRAMUSCULAR | Status: AC
  Filled 2019-08-12: qty 5

## 2019-08-12 SURGICAL SUPPLY — 56 items
ADH SKN CLS APL DERMABOND .7 (GAUZE/BANDAGES/DRESSINGS) ×1
BLADE SURG 15 STRL LF DISP TIS (BLADE) IMPLANT
BLADE SURG 15 STRL SS (BLADE)
CANISTER SUCT 3000ML PPV (MISCELLANEOUS) ×2 IMPLANT
CATH ACUITYPRO EH-R 9F (CATHETERS) ×2
CATH GD CS-EH R CVD 54X9FR (CATHETERS) IMPLANT
CLIP LIGATING EXTRA MED SLVR (CLIP) ×2 IMPLANT
CLIP LIGATING EXTRA SM BLUE (MISCELLANEOUS) ×2 IMPLANT
CONT SPEC 4OZ CLIKSEAL STRL BL (MISCELLANEOUS) IMPLANT
CORD BIPOLAR FORCEPS 12FT (ELECTRODE) ×2 IMPLANT
COVER SURGICAL LIGHT HANDLE (MISCELLANEOUS) ×2 IMPLANT
COVER WAND RF STERILE (DRAPES) ×2 IMPLANT
DERMABOND ADVANCED (GAUZE/BANDAGES/DRESSINGS) ×1
DERMABOND ADVANCED .7 DNX12 (GAUZE/BANDAGES/DRESSINGS) IMPLANT
DRAIN JACKSON RD 7FR 3/32 (WOUND CARE) IMPLANT
DRAIN SNY 10 ROU (WOUND CARE) ×1 IMPLANT
DRAPE HALF SHEET 40X57 (DRAPES) ×1 IMPLANT
ELECT COATED BLADE 2.86 ST (ELECTRODE) ×2 IMPLANT
ELECT PAIRED SUBDERMAL (MISCELLANEOUS) ×2
ELECT REM PT RETURN 9FT ADLT (ELECTROSURGICAL) ×2
ELECTRODE PAIRED SUBDERMAL (MISCELLANEOUS) IMPLANT
ELECTRODE REM PT RTRN 9FT ADLT (ELECTROSURGICAL) ×1 IMPLANT
EVACUATOR SILICONE 100CC (DRAIN) ×2 IMPLANT
FORCEPS BIPOLAR SPETZLER 8 1.0 (NEUROSURGERY SUPPLIES) ×2 IMPLANT
GAUZE 4X4 16PLY RFD (DISPOSABLE) ×5 IMPLANT
GLOVE BIO SURGEON STRL SZ 6.5 (GLOVE) ×2 IMPLANT
GLOVE BIOGEL M 6.5 STRL (GLOVE) ×2 IMPLANT
GOWN STRL REUS W/ TWL LRG LVL3 (GOWN DISPOSABLE) ×2 IMPLANT
GOWN STRL REUS W/TWL LRG LVL3 (GOWN DISPOSABLE) ×4
HEMOSTAT ARISTA ABSORB 3G PWDR (HEMOSTASIS) ×1 IMPLANT
KIT BASIN OR (CUSTOM PROCEDURE TRAY) ×2 IMPLANT
KIT TURNOVER KIT B (KITS) ×2 IMPLANT
LOCATOR NERVE 3 VOLT (DISPOSABLE) ×1 IMPLANT
NDL HYPO 25GX1X1/2 BEV (NEEDLE) IMPLANT
NEEDLE HYPO 25GX1X1/2 BEV (NEEDLE) IMPLANT
NS IRRIG 1000ML POUR BTL (IV SOLUTION) ×2 IMPLANT
PAD ARMBOARD 7.5X6 YLW CONV (MISCELLANEOUS) ×4 IMPLANT
PENCIL BUTTON HOLSTER BLD 10FT (ELECTRODE) ×2 IMPLANT
POSITIONER HEAD DONUT 9IN (MISCELLANEOUS) IMPLANT
POWDER SURGICEL 3.0 GRAM (HEMOSTASIS) IMPLANT
PROBE NERVBE PRASS .33 (MISCELLANEOUS) IMPLANT
SHEARS HARMONIC 9CM CVD (BLADE) ×2 IMPLANT
SPONGE INTESTINAL PEANUT (DISPOSABLE) IMPLANT
STAPLER VISISTAT 35W (STAPLE) IMPLANT
SUT ETHILON 2 0 FS 18 (SUTURE) ×2 IMPLANT
SUT PROLENE 5 0 PS 2 (SUTURE) IMPLANT
SUT SILK 3 0 REEL (SUTURE) ×2 IMPLANT
SUT SILK 3 0SH CR/8 30 (SUTURE) ×1 IMPLANT
SUT VIC AB 3-0 SH 27 (SUTURE) ×2
SUT VIC AB 3-0 SH 27X BRD (SUTURE) ×1 IMPLANT
SUT VIC AB 4-0 PS2 18 (SUTURE) ×1 IMPLANT
SUT VICRYL 4-0 PS2 18IN ABS (SUTURE) ×2 IMPLANT
TOWEL GREEN STERILE FF (TOWEL DISPOSABLE) ×2 IMPLANT
TRAY ENT MC OR (CUSTOM PROCEDURE TRAY) ×2 IMPLANT
TUBE ENDOTRAC EMG 7X10.2 (MISCELLANEOUS) ×1 IMPLANT
TUBE FEEDING 10FR FLEXIFLO (MISCELLANEOUS) IMPLANT

## 2019-08-12 NOTE — H&P (Signed)
The surgical history remains accurate and without interval change. The condition still exists which makes the procedure necessary. The patient and/or family is aware of their condition and has been informed of the risks and benefits of surgery, as well as alternatives. All parties have elected to proceed with surgery.   Surgical plan: total thyroidectomy, right selective neck dissection  Subjective: Aimee Figueroa is a 48 y.o. female seen in follow up today. Ultrasound-guided FNA of right thyroid mass as well as adjacent lymph node were both positive for papillary thyroid carcinoma. She has subsequently undergone a CT scan which demonstrates a likely metastatic node in the right level 3, right level 4 as well as in the thyroid bed. She is here today with her husband. They both speak Spanish. The [employer] interpreter was used for the entirety of this visit. Sharyn Lull [phone]).  No voice changes, no dysphagia.   Past Medical History:  Diagnosis Date  . GERD (gastroesophageal reflux disease)  . Hyperlipidemia   Past Surgical History:  Procedure Laterality Date  . HYSTERECTOMY   No family history on file. Social History   Socioeconomic History  . Marital status: Single  Spouse name: Not on file  . Number of children: Not on file  . Years of education: Not on file  . Highest education level: Not on file  Occupational History  . Not on file  Social Needs  . Financial resource strain: Not on file  . Food insecurity  Worry: Not on file  Inability: Not on file  . Transportation needs  Medical: Not on file  Non-medical: Not on file  Tobacco Use  . Smoking status: Never Smoker  . Smokeless tobacco: Never Used  Substance and Sexual Activity  . Alcohol use: Never  Frequency: Never  . Drug use: Never  . Sexual activity: Not on file  Lifestyle  . Physical activity  Days per week: Not on file  Minutes per session: Not on file  . Stress: Not on file  Relationships  .  Social Medical illustrator on phone: Not on file  Gets together: Not on file  Attends religious service: Not on file  Active member of club or organization: Not on file  Attends meetings of clubs or organizations: Not on file  Relationship status: Not on file  Other Topics Concern  . Not on file  Social History Narrative  . Not on file   Allergies  Allergen Reactions  . Acetaminophen Rash (ALLERGY/intolerance)   Updated Medication List:   atorvastatin (LIPITOR) 20 MG tablet  Sig - Route: Take 20 mg by mouth. - Oral  Class: Historical Med  cholecalciferol, vitamin D3, 50 mcg (2,000 unit) Tab  Sig - Route: Take 2,000 Units by mouth. - Oral  Class: Historical Med  docosahexaenoic acid-epa 120-180 mg Cap  Sig - Route: Take 1 capsule by mouth. - Oral  Class: Historical Med  multivitamin,tx-minerals (VITAMINS AND MINERALS) Tab  Sig - Route: Take by mouth. - Oral  Class: Historical Med  nortriptyline (PAMELOR) 25 MG capsule  Sig - Route: Take 25 mg by mouth. - Oral  Class: Historical Med  omega-3 acid ethyl esters (LOVAZA) 1 gram capsule  Sig - Route: Take by mouth. - Oral  Class: Historical Med  omeprazole (PRILOSEC) 40 MG capsule  Sig - Route: Take 40 mg by mouth. - Oral  Class: Historical Med    ROS A complete review of systems was conducted and was negative except as stated in the  HPI.   Objective:   Today's Vitals   08/12/19 0624 08/12/19 0651  BP: 113/60   Pulse: 74   Resp: 20   Temp: 98.6 F (37 C)   TempSrc: Oral   SpO2: 99%   Weight: 65.8 kg   Height: 5' (1.524 m)   PainSc:  5    Body mass index is 28.33 kg/m.   Physical Exam:  General Normocephalic, Awake, Alert  Eyes PERRL, no scleral icterus or conjunctival hemorrhage. EOMI.  Ears Right ear- canal patent, minimal cerumen.  Left ear- canal patent, minimal cerumen.  Nose Patent, No polyps or masses seen.  Oral Pharynx No mucosal lesions or tumors seen.  Lymphatics No cervical lymphadenopathy   Endocrine No thyroidmegaly, no thyroid masses palpated  Cardio-vascular No cyanosis, cardiac auscultation - regular rate, no murmur  Pulmonary No audible stridor, Breathing easily with no labor.  Neuro Symmetric facial movement. Tongue protrudes in midline.  Psychiatry Appropriate affect and mood for clinic visit.  Skin No scars or lesions on face or neck.    DATA REVIEW: 06/27/2019 FNA reviewed and demonstrates papillary thyroid carcinoma in the right thyroid nodule as well as an adjacent lymph node that was noted on prior neck ultrasound.  07/04/2019 CT neck with contrast report "IMPRESSION: 1. Two hyperdense lymph nodes are present in the right neck. One at level 3 and one at level 4. The level 3 lymph node has been biopsied and was positive for metastatic disease. The level 4 node is presumably metastatic as well. 2. No additional hyperdense lymph nodes are present. Other subcentimeter isointense lymph nodes are present. 3. Known malignant nodule in the upper pole of the right lobe of the Thyroid."  Labs: TSH August 2020 demonstrates 1.126, normal   Assessment:  My impression is that Aimee Figueroa has  1. Papillary thyroid carcinoma (HCC)  2. Cervical adenopathy  . We had a lengthy discussion today about the natural course of papillary thyroid cancer plan surgery, risks and benefits which include pain, infection, bleeding, damage to surrounding structures including arteries veins or nerves, the recurrent laryngeal nerve, both recurrent laryngeal nerves and potential need for tracheostomy if that were the case, parathyroid glands a potential hypocalcemia. She understands that she will she will need to be on lifelong thyroid hormone replacement moving forward. I will also present her case at our tumor board for discussion. We will work on getting her on our surgical schedule for the near future.   Plan:  1. Will plan for total thyroidectomy, right selective neck dissection, central  compartment neck dissection. Will stay 1-2 nights in the hospital.   Helayne Seminole, MD

## 2019-08-12 NOTE — Anesthesia Procedure Notes (Signed)
Procedure Name: Intubation Date/Time: 08/12/2019 8:18 AM Performed by: Kathryne Hitch, CRNA Pre-anesthesia Checklist: Patient identified, Emergency Drugs available, Suction available and Patient being monitored Patient Re-evaluated:Patient Re-evaluated prior to induction Oxygen Delivery Method: Circle system utilized Preoxygenation: Pre-oxygenation with 100% oxygen Induction Type: IV induction Ventilation: Mask ventilation without difficulty and Oral airway inserted - appropriate to patient size Laryngoscope Size: McGraph and 4 Grade View: Grade I Tube type: Oral Tube size: 7.5 mm Number of attempts: 1 Airway Equipment and Method: Stylet and Oral airway Placement Confirmation: ETT inserted through vocal cords under direct vision,  positive ETCO2 and breath sounds checked- equal and bilateral Secured at: 22 cm Tube secured with: Tape Dental Injury: Teeth and Oropharynx as per pre-operative assessment  Comments: NIMS tube placed per surgeon request + glidescope utilized.

## 2019-08-12 NOTE — Transfer of Care (Signed)
Immediate Anesthesia Transfer of Care Note  Patient: Aimee Figueroa  Procedure(s) Performed: THYROIDECTOMY RIGHT SELECTIVE NECK AND CENTRAL NECK DISSECTION (Bilateral Neck)  Patient Location: PACU  Anesthesia Type:General  Level of Consciousness: drowsy and patient cooperative  Airway & Oxygen Therapy: Patient Spontanous Breathing and Patient connected to face mask oxygen  Post-op Assessment: Report given to RN and Post -op Vital signs reviewed and stable  Post vital signs: Reviewed and stable  Last Vitals:  Vitals Value Taken Time  BP 127/61 08/12/19 1121  Temp    Pulse 81 08/12/19 1125  Resp 20 08/12/19 1125  SpO2 97 % 08/12/19 1125  Vitals shown include unvalidated device data.  Last Pain:  Vitals:   08/12/19 0651  TempSrc:   PainSc: 5       Patients Stated Pain Goal: 3 ([id number])  Complications: No apparent anesthesia complications

## 2019-08-12 NOTE — Progress Notes (Signed)
   ENT Progress Note: s/p Procedure(s): THYROIDECTOMY RIGHT SELECTIVE NECK AND CENTRAL NECK DISSECTION   Subjective: Complaining of moderate discomfort, tolerating p.o. fluids  Objective: Vital signs in last 24 hours: Temp:  [98.2 F (36.8 C)-98.6 F (37 C)] 98.2 F (36.8 C) (11/13 1530) Pulse Rate:  [74-100] 89 (11/13 1530) Resp:  [12-33] 18 (11/13 1530) BP: (98-128)/(43-69) 114/57 (11/13 1530) SpO2:  [92 %-99 %] 97 % (11/13 1530) Weight:  [65.8 kg] 65.8 kg (11/13 0624) Weight change:     Intake/Output from previous day: No intake/output data recorded. Intake/Output this shift: Total I/O In: 1400 [I.V.:1400] Out: 1250 [Urine:1100; Blood:150]  Labs: Recent Labs    08/10/19 1052 08/12/19 1602  WBC 3.9* 10.1  HGB 13.7 14.1  HCT 41.3 40.4  PLT 154 162   Recent Labs    08/10/19 1052  NA 138  K 3.6  CL 103  CO2 22  GLUCOSE 93  BUN 11  CALCIUM 9.3    Studies/Results: No results found.   PHYSICAL EXAM: Incision intact, no swelling or erythema. JP functioning normally with minimal output.   Airway stable, no significant hoarseness   Assessment/Plan: Patient stable after thyroidectomy and right neck dissection. Continue current pain medication, monitor drain output. Advance oral diet as tolerated. Postop calcium pending.    Jerrell Belfast 08/12/2019, 4:49 PM

## 2019-08-12 NOTE — Anesthesia Preprocedure Evaluation (Addendum)
Anesthesia Evaluation  Patient identified by MRN, date of birth, ID band Patient awake    Reviewed: Allergy & Precautions, NPO status , Patient's Chart, lab work & pertinent test results  History of Anesthesia Complications Negative for: history of anesthetic complications  Airway Mallampati: II  TM Distance: >3 FB Neck ROM: Full    Dental  (+) Dental Advisory Given, Teeth Intact   Pulmonary neg pulmonary ROS, neg recent URI,    breath sounds clear to auscultation       Cardiovascular negative cardio ROS   Rhythm:Regular     Neuro/Psych  Headaches, PSYCHIATRIC DISORDERS Anxiety Depression  Neuromuscular disease    GI/Hepatic Neg liver ROS, PUD, GERD  Medicated and Controlled,  Endo/Other  negative endocrine ROSpapillary thyroid carcinoma  Renal/GU negative Renal ROS     Musculoskeletal  (+) Arthritis , Fibromyalgia -  Abdominal   Peds  Hematology negative hematology ROS (+)   Anesthesia Other Findings   Reproductive/Obstetrics                            Anesthesia Physical Anesthesia Plan  ASA: II  Anesthesia Plan: General   Post-op Pain Management:    Induction: Intravenous  PONV Risk Score and Plan: 3 and Ondansetron and Dexamethasone  Airway Management Planned: Oral ETT  Additional Equipment: None  Intra-op Plan:   Post-operative Plan: Extubation in OR  Informed Consent: I have reviewed the patients History and Physical, chart, labs and discussed the procedure including the risks, benefits and alternatives for the proposed anesthesia with the patient or authorized representative who has indicated his/her understanding and acceptance.     Dental advisory given  Plan Discussed with: CRNA and Surgeon  Anesthesia Plan Comments:         Anesthesia Quick Evaluation

## 2019-08-12 NOTE — Anesthesia Postprocedure Evaluation (Signed)
Anesthesia Post Note  Patient: Aimee Figueroa  Procedure(s) Performed: THYROIDECTOMY RIGHT SELECTIVE NECK AND CENTRAL NECK DISSECTION (Bilateral Neck)     Patient location during evaluation: PACU Anesthesia Type: General Level of consciousness: awake and alert Pain management: pain level controlled Vital Signs Assessment: post-procedure vital signs reviewed and stable Respiratory status: spontaneous breathing, nonlabored ventilation, respiratory function stable and patient connected to nasal cannula oxygen Cardiovascular status: blood pressure returned to baseline and stable Postop Assessment: no apparent nausea or vomiting Anesthetic complications: no    Last Vitals:  Vitals:   08/12/19 1505 08/12/19 1530  BP: (!) 106/59 (!) 114/57  Pulse: 80 89  Resp: 18 18  Temp: 37 C 36.8 C  SpO2: 97% 97%    Last Pain:  Vitals:   08/12/19 1625  TempSrc:   PainSc: 2                  Tahni Porchia

## 2019-08-12 NOTE — Discharge Instructions (Addendum)
-There is skin glue over your incision.  Keep this dry for 24 hours after surgery.  After that, you may shower.  Do not scrub over your incision.  When you get out of the shower just pat it dry. -Keep drain site clean and dry.  -You may shower like you normally would. Do not scrub over incision. Pat dry.  -No strenuous exercise or heavy lifting until 14 days after surgery. Walk/ambulate at home often. Perform calf and stretch exercises often to prevent blood clot formation.  -Do not take more pain medication than prescribed. You should begin slowly weaning off the pain medication. No refills will be prescribed.  -It is a good idea to take a stool softener (such as miralax or colace) while you are taking narcotic pain medication as it can cause a little constipation.     -Monitor for any symptoms of having low calcium.  This includes tingling around your lips or tingling and numbness at your fingertips.  Dr. Blenda Nicely has placed you on 2 prescriptions for low calcium.  This is Rocaltrol, as well as Os-Cal.  These prescriptions have been sent to your pharmacy.  Make sure you take these prescriptions exactly as they were prescribed on 08/16/19.  -IF you having tingling or numbness around your fingertips or muscle spasms, even though you are taking your OsCal and Rocaltrol, THEN chew 2-3 tums. If symptoms don't resolve in 20-30 minutes, then call our office right away.  -You will have your labs drawn at [facility] 08/22/19. My office will help arrange this.  -You will have followup with Dr. Blenda Nicely in her clinic on 08/23/19 at 10:40am. Our address is [place], [place] taking the omeprazole for now, as this can make it difficult to absorb calcium.      Tiroidectoma, cuidados posteriores Thyroidectomy, Care After Esta hoja le brinda informacin sobre cmo cuidarse despus del procedimiento. El mdico tambin podr darle instrucciones ms especficas. Comunquese con el mdico si tiene  problemas o preguntas. Qu puedo esperar despus del procedimiento? Despus del procedimiento, es comn [employer] siguientes sntomas:  Dolor leve de cuello o la parte superior del cuerpo, especialmente al tragar.  Inflamacin de cuello.  Dolor de Investment banker, operational.  Voz dbil o ronca.  Ligero hormigueo o adormecimiento alrededor de la boca o en los dedos de las manos o los pies. Esto puede durar Optician, dispensing o dos despus de la [place]. Esta afeccin es provocada por los niveles bajos de calcio. Pueden administrarle suplementos de calcio para tratarla. Sigue estas instrucciones en tu casa:  Medicamentos  Use los medicamentos de venta libre y los recetados solamente como se lo haya indicado el mdico.  No conduzca ni use maquinaria pesada mientras toma analgsicos recetados.  No tome medicamentos que contengan aspirina e ibuprofeno hasta que el mdico lo autorice, ya que pueden aumentar el riesgo de hemorragias.  Tome un medicamento de hormona tiroidea como le haya indicado el mdico. Tendr que tomar este medicamento por el resto de su vida si le extirparon la glndula tiroidea por completo. Comida y bebida  Empiece a comer lentamente. Tal vez tenga que beber solo lquidos e ingerir comidas blandas durante algunos das o como el mdico se lo haya indicado.  A fin de prevenir o tratar el estreimiento mientras toma analgsicos recetados, el mdico puede recomendarle lo siguiente: ? Beba suficiente lquido como para mantener la orina de color amarillo plido. ? Usar medicamentos recetados o de Radio broadcast assistant. ? Consuma alimentos  ricos en fibra, como frutas y verduras frescas, cereales integrales y frijoles. ? Limitar el consumo de alimentos ricos en grasas y azcares procesados, como alimentos fritos y dulces. Cuidado de la incisin  Siga las instrucciones del mdico acerca del cuidado de la incisin. Asegrese de hacer lo siguiente: ? Lvese las manos con agua y jabn antes de Quarry manager las vendas  (vendaje). Use desinfectante para manos si no dispone de [place] y [place]. ? Cambie los vendajes como se lo haya indicado el mdico. ? No retire los puntos (suturas), la goma para cerrar la piel o las tiras [place]. Es posible que estos cierres cutneos deban quedar puestos en la piel durante 2semanas o ms tiempo. Si los bordes de las tiras adhesivas empiezan a despegarse y Therapist, sports, puede recortar los que estn sueltos. No retire las tiras [facility] por completo a menos que el mdico se lo indique.  [place] zona de la incisin para detectar signos de infeccin. Est atento a los siguientes signos: ? Enrojecimiento, hinchazn o dolor. ? Lquido o sangre. ? Calor. ? Pus o mal olor.  No tome baos de inmersin, no nade ni use el jacuzzi hasta que el mdico lo autorice. Actividad  Durante los primeros 10das despus del procedimiento o como se lo haya indicado el mdico: ? No levante ningn objeto que pese ms de 10libras (4.5kg). ? No trote, no practique natacin ni haga otros ejercicios que requieran mucho esfuerzo. ? No practique deportes de contacto.  Evite estar sentada durante largos perodos sin moverse. Levntese y camine un poco cada 1 a 2 horas. Esto es necesario para mejorar el flujo sanguneo y la respiracin. Pida ayuda si se siente dbil o inestable.  Retome sus actividades normales segn lo indicado por el mdico. Pregntele al mdico qu actividades son seguras para usted. Indicaciones generales  No consuma ningn producto que contenga nicotina o tabaco, como cigarrillos y Psychologist, sport and exercise. Estos pueden retrasar la cicatrizacin despus de la ciruga. Si necesita ayuda para dejar de consumir, consulta al mdico.  Concurrir a todas las visitas de seguimiento como se lo haya indicado el mdico. Esto es importante. Su mdico debe controlar el nivel de calcio en la sangre para asegurarse de que no est bajo. Comunquese con un mdico si:  Tienes  fiebre.  Presenta ms enrojecimiento, hinchazn o dolor alrededor de la zona de la incisin.  Tiene lquido o [employer] del Environmental consultant de la incisin.  Nota que la zona de la incisin se siente caliente al tacto.  Tiene pus o percibe que sale mal olor del lugar de la incisin.  Tiene dificultad para hablar.  Tiene vmitos o nuseas durante ms de 2 das. Solicite ayuda inmediatamente si:  Tiene dificultad para respirar.  Tiene dificultad para tragar.  Aparece una erupcin cutnea.  Tiene una tos que [place].  Nota cambios en su forma de hablar o una ronquera que [place].  Presenta adormecimiento, hormigueo o espasmos musculares en sus brazos, manos, pies o rostro. Resumen  Despus del procedimiento, es comn sentir un dolor leve en el cuello o en la parte superior del cuerpo, especialmente al tragar.  Tome los [employer] se lo haya indicado el mdico. Estos incluyen analgsicos y hormonas tiroideas, si son necesarios.  Siga las instrucciones del mdico acerca del cuidado de la incisin. Observe si se presentan signos de infeccin.  Concurrir a todas las visitas de seguimiento como se lo haya indicado el mdico. Esto es importante. Su mdico debe controlar  el nivel de calcio en la sangre para asegurarse de que no est bajo.  Solicite ayuda de inmediato si tiene dificultad para Ambulance person, o adormecimiento, hormigueo o espasmos musculares en los brazos, las manos, los pies o el rostro. Esta informacin no tiene Marine scientist el consejo del mdico. Asegrese de hacerle al mdico cualquier pregunta que tenga. Document Released: 03/05/2010 Document Revised: 11/16/2018 Document Reviewed: 11/16/2018 Elsevier Patient Education  [place] Hypoparathyroidism  El hipoparatiroidismo es una afeccin poco frecuente en la que una persona no produce suficiente cantidad de cierto tipo de hormona (hormona paratiroidea o PTH). La PTH se produce  en las glndulas paratiroideas, que son Pati Gallo pequeas ubicadas al lado de la glndula tiroidea, en el cuello. La PTH equilibra los niveles de calcio, fsforo y vitamina D del organismo. Si no tiene suficiente cantidad de PTH, los niveles de calcio descienden mucho, y los niveles de fsforo aumentan en gran cantidad. Cules son las causas? Las causas de esta afeccin son las siguientes:  Extirpacin o lesin de las glndulas paratiroideas como resultado de [place] de cuello o [place].  Sndrome de [place], el cual puede provocar un desarrollo deficiente o la ausencia de las glndulas paratiroideas. Se trata de una afeccin gentica.  Enfermedad autoinmunitaria. Esta ocurre cuando el organismo ataca y destruye el tejido paratiroideo.  Niveles altos de radiacin debido a un tratamiento para el cncer en la zona del cuello.  Niveles bajos de magnesio.  Reaccin a un tratamiento con yodo radioactivo. Esto es poco frecuente. Cules son los signos o los sntomas? Los sntomas de esta afeccin incluyen los siguientes:  Sensaciones de hormigueo en la punta de los dedos de las manos y de los pies, y en los labios.  Fasciculaciones en la boca, las manos, los brazos o la garganta.  Dolor en el abdomen.  Calambres y [employer].  Cansancio y debilidad.  Perodos menstruales dolorosos.  Problemas de visin, incluidas cataratas.  Cada del pelo.  Sequedad de la piel o del pelo.  Uas quebradizas.  Dolores de [place].  Ansiedad.  Depresin o cambios en el estado de nimo.  Dificultad para recordar cosas.  Convulsiones.  Problemas respiratorios.  Problemas en los dientes, que incluyen un debilitamiento en el esmalte de los dientes y un crecimiento de los dientes tardo o nulo. Cmo se diagnostica? Esta afeccin se diagnostica en funcin de lo siguiente:  Sus antecedentes mdicos.  Un examen fsico.  Otros estudios, por ejemplo: ? Anlisis de  [place]. ? Anlisis de [place]. ? Radiografas o densitometras para controlar el [employer]. ? Electrocardiograma (ECG) para observar el funcionamiento del corazn. ? Exploracin por tomografa computarizada (TC) para detectar depsitos de calcio. Cmo se trata? Esta afeccin se trata con lo siguiente:  Suplementos, como calcio y vitamina D.  Cambios en la alimentacin, que incluyen los siguientes: ? Consumir una mayor cantidad de calcio, incluidos productos lcteos, verduras de [employer], cereales enriquecidos y jugos. ? Disminuir el consumo de alimentos y bebidas que contengan mucho fsforo, por ejemplo, la carne y los refrescos.  Exponerse al sol entre 15 y 71minutos, 2veces por semana.  Infusin intravenosa de calcio en casos graves. Para ello, se requiere la hospitalizacin.  Inyecciones de PTH. Siga estas indicaciones en su casa: Comida y bebida   Siga las indicaciones del mdico acerca de los cambios en la alimentacin.  Consulte a un nutricionista o un mdico sobre sus necesidades alimentarias.  Consuma [employer]  y agua para English as a second language teacher estreimiento que provocan los suplementos de calcio.  Tome los suplementos solamente como se lo haya indicado el mdico.  Beba suficiente lquido para mantener la orina clara o de color amarillo plido. Instrucciones generales  [employer] de venta libre y los recetados solamente como se lo haya indicado el mdico.  Consulting civil engineer a todas las visitas de control como se lo haya indicado el mdico. Esto es importante. ? El mdico deber Optometrist estudios regularmente y [place] tratamiento segn sea necesario. ? El mdico deber controlar regularmente los niveles de calcio y fsforo. Al principio, los anlisis de sangre sern frecuentes; luego, con [place], se reducirn a unas cuantas visitas al ao. Comunquese con un mdico si:  Los sntomas no mejoran con Dispensing optician.  Tiene vmitos, nuseas, estreimiento,  poca energa, confusin y debilidad muscular. Solicite ayuda de inmediato si:  Tiene una convulsin.  Tiene dificultad para respirar.  Tiene mucha sed que no se calma despus de tomar agua.  Siente un dolor intenso en el abdomen. Resumen  El hipoparatiroidismo es una afeccin poco frecuente.  Si el organismo no produce suficiente cantidad de hormona paratiroidea (PTH), los niveles de calcio en la sangre y en los huesos descienden [employer], y los niveles de fsforo aumentan en gran cantidad.  El tratamiento podra incluir suplementos, cambios alimentarios, la exposicin al sol, la infusin de calcio e inyecciones de PTH. Esta informacin no tiene Marine scientist el consejo del mdico. Asegrese de hacerle al mdico cualquier pregunta que tenga. Document Released: 07/06/2013 Document Revised: 05/12/2017 Document Reviewed: 05/12/2017 Elsevier Patient Education  2020 [employer].

## 2019-08-12 NOTE — Op Note (Signed)
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    ANESTHESIA: General endotracheal anesthesia with NIM monitoring    ESTIMATED BLOOD LOSS: 50 mL  SPECIMENS: Total thyroidectomy, stitch marks right lobe Right neck dissection   COMPLICATIONS: None    OPERATIVE FINDINGS: The recurrent laryngeal nerves were identified and found to be intact on both sides. Superior parathyroid candidates were identified on both sides. Nodule in right superior lobe.    OPERATIVE DETAILS: The patient was brought to the operating room placed in supine position.  General anesthesia was induced.  Anesthesia intubated the patient with a glide scope and used a Nim tube.  This was hooked up appropriately with grounding leads applied to the sternum.  Shoulder roll was placed.  The patient's head was turned slightly to the left.  Horizontal neck incision was designed and injected with 1% lidocaine with 1-100,000 epinephrine.  The patient was then prepped and draped in the usual sterile fashion.  A timeout was performed.  Next, we began the right neck dissection. We made an incision through a horizontal neck crease extending to the midline at about the level of the cricoid. We carried the incision through the skin, subcutaneous tissues and platysma. We elevated subplatysmal flaps in the inferior and superior directions.  Next, the right sternocleidomastoid muscle was skeletonized along the anterior border. We elevated the sternocleidomastoid muscle off the underlying lymphatic contents in an anterior to posterior direction. At the superior aspect of this dissection, we  worked below the level of the  spinal accessory nerve.    We began elevating the lymphatic contents off the lateral surface of the internal jugular vein proceeding in an anterior to posterior direction, extending to a level just below the omohyoid completing the level for region. Behind the internal jugular vein, we elevated the contents off the floor of the neck in a posterior direction all the way to the posterior border of the sternocleidomastoid muscle.  With the right neck contents passed off the field, we then proceeded with total thyroidectomy.      The strap muscles were elevated laterally off the left lobe using blunt finger dissection.  The gland was retracted medially and dissection then proceeded superiorly towards the superior pedicle. This was carefully dissected circumferentially and then ligated and divided using the Harmonic focus device.  Dissection was then carried inferiorly, where the middle thyroid vein was noted.  This was divided and the gland was retracted medially.  We then carefully dissected near the inferior pole, where we were able identify the recurrent laryngeal nerve in its normal position. Candidates for the superior parathyroid gland was seen and dissected free and left in the neck.  The inferior thyroid vascular pedicle was then ligated and divided.  Dissection along the distal course of the recurrent laryngeal nerve was performed using a McCabe dissector, peeling the gland off its surface. Next, the thyroid gland was released from its tracheal attachments by dividing Berry's ligament.  An identical procedure was repeated for the right thyroid lobe. The recurrent laryngeal nerves was stimulated and verified to be intact.  Copious irrigation of the neck was performed and hemostasis was achieved with bipolar where necessary. A Valsalva maneuver  was performed and no lymphatic leak was noted.  Hemostasis was achieved with bipolar cautery.  Arista was also applied to the wound bed.  A #10 round drain was brought through the skin and placed into the thyroid compartment and secured to the skin with a 3-0 nylon. Interrupted 3-0 Vicryls were used to reapproximate the strap muscles in midline and to close platysma.  The dermis was closed with 4-0 Vicryls and Dermabond was used for the skin. The patient was awoken from anesthesia, extubated, and transported to PACU in good condition without complications.    Helayne Seminole, MD

## 2019-08-13 ENCOUNTER — Encounter (HOSPITAL_COMMUNITY): Payer: Self-pay | Admitting: Otolaryngology

## 2019-08-13 LAB — CALCIUM
Calcium: 8.1 mg/dL — ABNORMAL LOW (ref 8.9–10.3)
Calcium: 7.7 mg/dL — ABNORMAL LOW (ref 8.9–10.3)

## 2019-08-13 LAB — PARATHYROID HORMONE, INTACT (NO CA): PTH: 8 pg/mL — ABNORMAL LOW (ref 15–65)

## 2019-08-13 MED ORDER — SODIUM CHLORIDE 0.9 % IV SOLN: 250.0000 mL | INTRAVENOUS | Status: DC | PRN

## 2019-08-13 MED ORDER — SODIUM CHLORIDE 0.9% FLUSH: 3.0000 mL | Freq: Two times a day (BID) | INTRAVENOUS | Status: DC

## 2019-08-13 MED ORDER — CALCIUM CARBONATE 1250 (500 CA) MG PO TABS
1.0000 | ORAL_TABLET | Freq: Three times a day (TID) | ORAL | Status: DC
  Administered 2019-08-13 – 2019-08-14 (×3): 500 mg via ORAL
  Filled 2019-08-13 (×3): qty 1

## 2019-08-13 MED ORDER — CALCITRIOL 0.25 MCG PO CAPS: 0.2500 ug | ORAL_CAPSULE | Freq: Every day | ORAL | 1 refills | Status: DC

## 2019-08-13 MED ORDER — OXYCODONE HCL 5 MG PO TABS: 5.0000 mg | ORAL_TABLET | ORAL | 0 refills | Status: AC | PRN

## 2019-08-13 MED ORDER — SODIUM CHLORIDE 0.9% FLUSH: 3.0000 mL | INTRAVENOUS | Status: DC | PRN

## 2019-08-13 MED ORDER — CALCIUM CARBONATE 1250 (500 CA) MG PO TABS: 1.0000 | ORAL_TABLET | Freq: Three times a day (TID) | ORAL | 1 refills | Status: DC

## 2019-08-13 MED ORDER — DOCUSATE SODIUM 100 MG PO CAPS: 100.0000 mg | ORAL_CAPSULE | Freq: Two times a day (BID) | ORAL | 0 refills | Status: AC

## 2019-08-13 MED ORDER — CALCITRIOL 0.25 MCG PO CAPS
0.2500 ug | ORAL_CAPSULE | Freq: Every day | ORAL | Status: DC
  Administered 2019-08-13: 0.25 ug via ORAL
  Filled 2019-08-13 (×3): qty 1

## 2019-08-13 MED ORDER — OXYCODONE HCL 5 MG PO TABS: 5.0000 mg | ORAL_TABLET | ORAL | 0 refills | Status: DC | PRN

## 2019-08-13 NOTE — Progress Notes (Signed)
   ENT Progress Note: s/p Procedure(s): THYROIDECTOMY RIGHT SELECTIVE NECK AND CENTRAL NECK DISSECTION   Subjective: Pain controlled.  Tolerating p.o. intake.  Voice sounds good. JP drain with 22 cc of output.  No tingling or numbness around lips or at fingertips.  Objective: Vital signs in last 24 hours: Temp:  [98 F (36.7 C)-99.5 F (37.5 C)] 98 F (36.7 C) (11/14 1019) Pulse Rate:  [80-100] 86 (11/14 1019) Resp:  [12-33] 18 (11/14 1019) BP: (98-128)/(43-69) 115/67 (11/14 1019) SpO2:  [92 %-100 %] 100 % (11/14 1019) Weight change:     Intake/Output from previous day: 11/13 0701 - 11/14 0700 In: 2600.4 [P.O.:60; I.V.:2540.4] Out: 1272 [Urine:1100; Drains:22; Blood:150] Intake/Output this shift: Total I/O In: 120 [P.O.:120] Out: -   Labs: Recent Labs    08/10/19 1052 08/12/19 1602  WBC 3.9* 10.1  HGB 13.7 14.1  HCT 41.3 40.4  PLT 154 162   Recent Labs    08/10/19 1052 08/12/19 1602 08/13/19 0513  NA 138  --   --   K 3.6  --   --   CL 103  --   --   CO2 22  --   --   GLUCOSE 93  --   --   BUN 11  --   --   CALCIUM 9.3 8.9 8.1*    Studies/Results: No results found. Calcium slightly decreased 8.1 PTH is 8  PHYSICAL EXAM: Incision intact, no swelling or erythema.  Neck is soft. JP functioning normally with minimal output.   Airway stable, no dysphonia.   Assessment/Plan: Patient stable after thyroidectomy and right neck dissection. Continue current pain medication, monitor drain output. Soft diet We will follow calcium this evening and tomorrow morning We will follow repeat PTH tomorrow morning Begin Rocaltrol 0.25 daily, prescription sent to pharmacy Begin Os-Cal 3 times daily with meals, prescription sent to pharmacy Continue oxycodone for pain control, prescription sent to pharmacy Saline lock Ambulate.  Continue incentive spirometry. Plan for drain out and home tomorrow as long as calcium and PTH are stable.    Helayne Seminole,  MD 08/13/2019, 10:34 AM

## 2019-08-13 NOTE — Plan of Care (Signed)
  Problem: Clinical Measurements: Goal: Postoperative complications will be avoided or minimized Outcome: Progressing   

## 2019-08-14 LAB — CALCIUM
Calcium: 7.3 mg/dL — ABNORMAL LOW (ref 8.9–10.3)
Calcium: 7.4 mg/dL — ABNORMAL LOW (ref 8.9–10.3)

## 2019-08-14 MED ORDER — CALCIUM CARBONATE 1250 (500 CA) MG PO TABS
2.0000 | ORAL_TABLET | Freq: Three times a day (TID) | ORAL | Status: DC
  Administered 2019-08-14 (×2): 1000 mg via ORAL
  Filled 2019-08-14 (×5): qty 1

## 2019-08-14 MED ORDER — INFLUENZA VAC SPLIT QUAD 0.5 ML IM SUSY
0.5000 mL | PREFILLED_SYRINGE | INTRAMUSCULAR | Status: AC
  Administered 2019-08-15: 0.5 mL via INTRAMUSCULAR
  Filled 2019-08-14: qty 0.5

## 2019-08-14 MED ORDER — CALCITRIOL 0.5 MCG PO CAPS
0.5000 ug | ORAL_CAPSULE | Freq: Every day | ORAL | Status: DC
  Administered 2019-08-14: 0.5 ug via ORAL
  Filled 2019-08-14: qty 1

## 2019-08-14 NOTE — Plan of Care (Signed)
  Problem: Clinical Measurements: Goal: Ability to maintain clinical measurements within normal limits will improve Outcome: Progressing Goal: Postoperative complications will be avoided or minimized Outcome: Progressing   

## 2019-08-14 NOTE — Progress Notes (Signed)
Pt c/o tingling in her hands and now in her head too. MD called and he explained that the low calcium will cause the tingling and he already went on the calcium replacement meds. Will recheck levels tomorrow

## 2019-08-14 NOTE — Progress Notes (Signed)
Patient ID: Aimee Figueroa, female   DOB: December 07, 1970, 48 y.o.   MRN: [id number] Subjective: She is doing reasonably well.  Complains of sore throat and tingling in her fingers.  Her pharmacy contacted her and is not able to supply the calcium medication until tomorrow.  Objective: Vital signs in last 24 hours: Temp:  [97.8 F (36.6 C)-98.8 F (37.1 C)] 98.4 F (36.9 C) (11/15 0559) Pulse Rate:  [65-86] 65 (11/15 0559) Resp:  [17-18] 17 (11/15 0559) BP: (98-115)/(59-67) 98/61 (11/15 0559) SpO2:  [99 %-100 %] 99 % (11/15 0559) Weight change:  Last BM Date: 08/11/19  Intake/Output from previous day: 11/14 0701 - 11/15 0700 In: 840 [P.O.:540; I.V.:300] Out: 5 [Drains:5] Intake/Output this shift: No intake/output data recorded.  PHYSICAL EXAM: She is awake and alert and in no distress.  Her voice is a little weak but not breathy and I believe it is from poor effort.  Her breathing is clear.  Surgical site looks excellent.  Drainage minimal.  Negative Ch[place]ostek sign.  Lab Results: Recent Labs    08/12/19 1602  WBC 10.1  HGB 14.1  HCT 40.4  PLT 162   BMET Recent Labs    08/12/19 1602  08/13/19 1531 08/14/19 0600  CREATININE 0.56  --   --   --   CALCIUM 8.9   < > 7.7* 7.3*   < > = values in this interval not displayed.    Studies/Results: No results found.  Medications: I have reviewed the patient's current medications.  Assessment/Plan: Postop day 2.  Drain was removed.  Surgical site looks excellent.  She is swallowing pretty well.  Calcium has dropped.  Continue to monitor and continue replacement.  Anticipate possible discharge tomorrow when we can be sure that her calcium will stabilize.  LOS: 2 days   Izora Gala 08/14/2019, 9:27 AM

## 2019-08-15 ENCOUNTER — Other Ambulatory Visit: Payer: Self-pay

## 2019-08-15 DIAGNOSIS — E209 Hypoparathyroidism, unspecified: Secondary | ICD-10-CM | POA: Diagnosis not present

## 2019-08-15 LAB — COMPREHENSIVE METABOLIC PANEL
ALT: 33 U/L (ref 0–44)
AST: 27 U/L (ref 15–41)
Albumin: 3.6 g/dL (ref 3.5–5.0)
Alkaline Phosphatase: 36 U/L — ABNORMAL LOW (ref 38–126)
Anion gap: 15 (ref 5–15)
BUN: 7 mg/dL (ref 6–20)
CO2: 29 mmol/L (ref 22–32)
Calcium: 7.9 mg/dL — ABNORMAL LOW (ref 8.9–10.3)
Chloride: 97 mmol/L — ABNORMAL LOW (ref 98–111)
Creatinine, Ser: 0.7 mg/dL (ref 0.44–1.00)
GFR calc Af Amer: >60 mL/min (ref >60)
GFR calc non Af Amer: >60 mL/min (ref >60)
Glucose, Bld: 104 mg/dL — ABNORMAL HIGH (ref 70–99)
Potassium: 3.7 mmol/L (ref 3.5–5.1)
Sodium: 141 mmol/L (ref 135–145)
Total Bilirubin: 0.6 mg/dL (ref 0.3–1.2)
Total Protein: 7 g/dL (ref 6.5–8.1)

## 2019-08-15 LAB — CALCIUM: Calcium: 6.9 mg/dL — ABNORMAL LOW (ref 8.9–10.3)

## 2019-08-15 LAB — SURGICAL PATHOLOGY

## 2019-08-15 LAB — PARATHYROID HORMONE, INTACT (NO CA): PTH: 7 pg/mL — ABNORMAL LOW (ref 15–65)

## 2019-08-15 MED ORDER — LEVOTHYROXINE SODIUM 25 MCG PO TABS
125.0000 ug | ORAL_TABLET | Freq: Every day | ORAL | Status: DC
  Administered 2019-08-15 – 2019-08-16 (×2): 125 ug via ORAL
  Filled 2019-08-15 (×2): qty 1

## 2019-08-15 MED ORDER — CALCITRIOL 0.5 MCG PO CAPS
1.0000 ug | ORAL_CAPSULE | Freq: Every day | ORAL | Status: DC
  Administered 2019-08-15 – 2019-08-16 (×2): 1 ug via ORAL
  Filled 2019-08-15 (×2): qty 2

## 2019-08-15 MED ORDER — CALCIUM CARBONATE 1250 (500 CA) MG PO TABS
4.0000 | ORAL_TABLET | Freq: Three times a day (TID) | ORAL | Status: DC
  Administered 2019-08-15 – 2019-08-16 (×5): 2000 mg via ORAL
  Filled 2019-08-15 (×8): qty 2

## 2019-08-15 MED ORDER — MAGNESIUM CITRATE PO SOLN
0.5000 | Freq: Once | ORAL | Status: AC
  Administered 2019-08-15: 0.5 via ORAL
  Filled 2019-08-15: qty 296

## 2019-08-15 MED FILL — CALCITRIOL 0.25 MCG CAPS: 0.25 | 30 days supply | Qty: 30 | Fill #0

## 2019-08-15 NOTE — Progress Notes (Signed)
Called Dr. Blenda Nicely and informed her EKG was gone this AM and read her the results.  Also informed her of updated Calcium level.  Pt doing well.  Will continue to monitor.  Aimee Figueroa

## 2019-08-15 NOTE — Progress Notes (Signed)
Patient ID: Aimee Figueroa, female   DOB: 06/03/1971, 48 y.o.   MRN: [id number] Subjective: She is doing reasonably well.  Complains of  tingling in her fingers and face.  Pain controlled.  Voice is good.  Sore throat improving.  Objective: Vital signs in last 24 hours: Temp:  [98.3 F (36.8 C)-98.9 F (37.2 C)] 98.6 F (37 C) (11/16 0357) Pulse Rate:  [64-80] 64 (11/16 0357) Resp:  [16-18] 18 (11/16 0357) BP: (96-98)/(37-59) 96/59 (11/16 0357) SpO2:  [98 %-100 %] 99 % (11/16 0357) Weight change:  Last BM Date: 08/11/19  Intake/Output from previous day: No intake/output data recorded. Intake/Output this shift: No intake/output data recorded.  PHYSICAL EXAM: She is awake and alert and in no distress.  Her voice is a little weak but not breathy and I believe it is from poor effort.  Her breathing is clear.  Surgical site looks excellent.  Drainage minimal.    Lab Results: Recent Labs    08/12/19 1602  WBC 10.1  HGB 14.1  HCT 40.4  PLT 162   BMET Recent Labs    08/12/19 1602  08/14/19 1531 08/15/19 0530  CREATININE 0.56  --   --   --   CALCIUM 8.9   < > 7.4* 6.9*   < > = values in this interval not displayed.    Studies/Results: No results found.  Medications: I have reviewed the patient's current medications.  Assessment/Plan: Postop day 3 from total thyroidectomy and right selective neck dissection.  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  Drain was removed.  Surgical site looks excellent.  She is swallowing pretty well.   Calcium has dropped.   Postop PTH went from 8-7.  Calcium this morning 6.9.  We will double her current elemental calcium, Os-Cal.  We will increase Rocaltrol to 1 which is doubling her dose. We will get an EKG. Mag citrate to help with constipation but also to give her magnesium supplementation which can help the body improve calcium insertion. Continue to monitor and continue replacement.   Anticipate  possible discharge tomorrow when we can be sure that her calcium will stabilize.  LOS: 3 days   Helayne Seminole 08/15/2019, 8:17 AM

## 2019-08-16 ENCOUNTER — Encounter (HOSPITAL_COMMUNITY): Payer: Self-pay | Admitting: General Practice

## 2019-08-16 DIAGNOSIS — C73 Malignant neoplasm of thyroid gland: Principal | ICD-10-CM

## 2019-08-16 LAB — PHOSPHORUS: Phosphorus: 7 mg/dL — ABNORMAL HIGH (ref 2.5–4.6)

## 2019-08-16 LAB — MAGNESIUM: Magnesium: 1.9 mg/dL (ref 1.7–2.4)

## 2019-08-16 LAB — CALCIUM
Calcium: 7.7 mg/dL — ABNORMAL LOW (ref 8.9–10.3)
Calcium: 8.2 mg/dL — ABNORMAL LOW (ref 8.9–10.3)

## 2019-08-16 MED ORDER — CALCIUM CARBONATE 1250 (500 CA) MG PO TABS: 4.0000 | ORAL_TABLET | Freq: Three times a day (TID) | ORAL | 0 refills | Status: AC

## 2019-08-16 MED ORDER — LEVOTHYROXINE SODIUM 125 MCG PO TABS: 125.0000 ug | ORAL_TABLET | Freq: Every day | ORAL | 1 refills | Status: AC

## 2019-08-16 MED ORDER — CALCIUM CARBONATE ANTACID 500 MG PO CHEW: CHEWABLE_TABLET | ORAL | 3 refills | Status: AC

## 2019-08-16 MED ORDER — CALCITRIOL 0.5 MCG PO CAPS: 1.0000 ug | ORAL_CAPSULE | Freq: Every day | ORAL | 1 refills | Status: AC

## 2019-08-16 NOTE — Consult Note (Signed)
Hospitalist Service Medical Consultation   Aimee Figueroa  [id number]  DOB: 28-Apr-1971  DOA: 08/12/2019  PCP: Aimee Pounds, NP   Outpatient Specialists: Dr. Blenda Nicely, ENT    Dr. Silverio Decamp, GI   Requesting physician: Dr. Blenda Nicely  Reason for consultation: Persistent post-operative hypocalcemia       History of Present Illness: Aimee Figueroa is an 48 y.o. female with Hx anxiety, remote TB and recent diagnosis papillary thyroid CA whom we are asked to see in consultation for persistent post-thyroidectomy hypocalcemia.  Patient diagnosed with papillary thyroid cancer back in September.  Admitted and underwent total thyroidectomy with selective neck dissection on 11/13, 4 days ago.  Candidates for superior parathyroid were identified intraoperatively, and left in the neck.  PTH/CA afternoon after surgery were 8 and 8.9 respectively. She was started on Oscal 2000 mg TID and Rocaltrol 1 mcg daily  Her subsequent Ca levels were 8.1 > 7.7 > 7.3 > 7.4 > 6.9 (corr to 7.2) She was symptomatic with foot cramps, perioral dysesthesias and bilateral hand tingling on day of Ca 6.9.    Today, she has had no cramps, perioral tingling, and her hand tingling is much improved.  She has had no seizures at any time.  ENT noticed a Chvostek sign on the right today.      Review of Systems:  Review of Systems  Constitutional: Negative for fever and malaise/fatigue.  HENT: Positive for ear pain (chronic). Negative for congestion, ear discharge, hearing loss, nosebleeds, sinus pain, sore throat and tinnitus.   Respiratory: Negative for stridor.   Neurological: Positive for dizziness and tingling. Negative for tremors, sensory change, speech change, focal weakness, seizures, loss of consciousness, weakness and headaches.  All other systems reviewed and are negative.    Past Medical History: Past Medical History:  Diagnosis Date  . Abnormal  Pap smear   . Allergy    seasonal  . Anxiety   . Depression   . Fibromyalgia   . Hyperlipemia   . Low blood pressure   . Pyloric ulcer associated with Helicobacter pylori, acute 05/22/2017   patient not sure if she has an ulcer - first dx  . SVD (spontaneous vaginal delivery)    x 4  . Tuberculosis    48 yrs old    Past Surgical History: Past Surgical History:  Procedure Laterality Date  . CESAREAN SECTION    . COLONOSCOPY    . GYNECOLOGIC CRYOSURGERY    . LAPAROSCOPIC ASSISTED VAGINAL HYSTERECTOMY N/A 05/26/2017   Procedure: LAPAROSCOPIC ASSISTED VAGINAL HYSTERECTOMY;  Surgeon: [name], MD;  Location: [facility];  Service: Gynecology;  Laterality: N/A;  . LAPAROSCOPIC BILATERAL SALPINGECTOMY Bilateral 05/26/2017   Procedure: LAPAROSCOPIC BILATERAL SALPINGECTOMY;  Surgeon: [name], MD;  Location: [facility];  Service: Gynecology;  Laterality: Bilateral;  . THYROIDECTOMY Bilateral 08/12/2019   Procedure: THYROIDECTOMY RIGHT SELECTIVE NECK AND CENTRAL NECK DISSECTION;  Surgeon: Helayne Seminole, MD;  Location: [facility];  Service: ENT;  Laterality: Bilateral;  . UPPER GI ENDOSCOPY       Allergies:   Allergies  Allergen Reactions  . Tylenol [Acetaminophen] Rash     Social History:  reports that she has never smoked. She has never used smokeless tobacco. She reports that she does not drink alcohol or use drugs.   Family History: Family History  Problem Relation Age of Onset  . Diabetes Father   . Hyperlipidemia Father   .  Hyperlipidemia Mother   . Uterine cancer Paternal Aunt   . Ulcerative colitis Cousin   . Anesthesia problems Neg Hx   . Colon cancer Neg Hx   . Rectal cancer Neg Hx   . Esophageal cancer Neg Hx   . Liver cancer Neg Hx      Physical Exam: Vitals:   08/15/19 0357 08/15/19 1355 08/15/19 1938 08/16/19 0633  BP: (!) 96/59 (!) 93/58 (!) 106/56 (!) 98/55  Pulse: 64 71 75 66  Resp: 18 18 19 18   Temp: 98.6 F (37 C) 98.6 F (37 C)  98.8 F (37.1 C) 98.6 F (37 C)  TempSrc: Oral Oral Oral Oral  SpO2: 99% 95% 99% 99%  Weight:      Height:        Constitutional: Alert and awake, oriented x3, not in any acute distress. Eyes: PERLA, EOMI, irises appear normal, anicteric sclera,  ENMT: external ears and nose appear normal, hearing normal            Lips appears normal  Neck: neck appears normal, clean dry and intact surgical scar without surrounding redness, induration or edema  CVS: S1-S2 clear, no murmur rubs or gallops, no JVD, no LE edema Respiratory:  clear to auscultation bilaterally, no wheezing, rales or rhonchi. Respiratory effort normal. No accessory muscle use.  GI: soft nontender, nondistended, normal bowel sounds, no hepatosplenomegaly, no hernias  Musculoskeletal: no cyanosis, clubbing or edema noted bilaterally Neuro: Cranial nerves II-XII intact, strength 5/5 and symmetric, sensation intact in all four extremities Psych: judgement and insight appear normal, stable mood and affect, mental status Skin: no rashes or lesions or ulcers, no induration or nodules    Data reviewed:  I have personally reviewed following labs and imaging studies Labs:  CBC: Recent Labs  Lab 08/10/19 1052 08/12/19 1602  WBC 3.9* 10.1  HGB 13.7 14.1  HCT 41.3 40.4  MCV 97.2 91.8  PLT 154 0000000    Basic Metabolic Panel: Recent Labs  Lab 08/10/19 1052 08/12/19 1602  08/14/19 0600 08/14/19 1531 08/15/19 0530 08/15/19 1513 08/16/19 0556  NA 138  --   --   --   --   --  141  --   K 3.6  --   --   --   --   --  3.7  --   CL 103  --   --   --   --   --  97*  --   CO2 22  --   --   --   --   --  29  --   GLUCOSE 93  --   --   --   --   --  104*  --   BUN 11  --   --   --   --   --  7  --   CREATININE 0.50 0.56  --   --   --   --  0.70  --   CALCIUM 9.3 8.9   < > 7.3* 7.4* 6.9* 7.9* 7.7*  MG  --   --   --   --   --   --   --  1.9  PHOS  --   --   --   --   --   --   --  7.0*   < > = values in this interval not  displayed.   GFR Estimated Creatinine Clearance: 73.6 mL/min (by C-G formula based on SCr of 0.7 mg/dL).  Liver Function Tests: Recent Labs  Lab 08/15/19 1513  AST 27  ALT 33  ALKPHOS 36*  BILITOT 0.6  PROT 7.0  ALBUMIN 3.6   No results for input(s): LIPASE, AMYLASE in the last 168 hours. No results for input(s): AMMONIA in the last 168 hours. Coagulation profile No results for input(s): INR, PROTIME in the last 168 hours.  Cardiac Enzymes: No results for input(s): CKTOTAL, CKMB, CKMBINDEX, TROPONINI in the last 168 hours. BNP: Invalid input(s): POCBNP CBG: No results for input(s): GLUCAP in the last 168 hours. D-Dimer No results for input(s): DDIMER in the last 72 hours. Hgb A1c No results for input(s): HGBA1C in the last 72 hours. Lipid Profile No results for input(s): CHOL, HDL, LDLCALC, TRIG, CHOLHDL, LDLDIRECT in the last 72 hours. Thyroid function studies No results for input(s): TSH, T4TOTAL, T3FREE, THYROIDAB in the last 72 hours.  Invalid input(s): FREET3 Anemia work up No results for input(s): VITAMINB12, FOLATE, FERRITIN, TIBC, IRON, RETICCTPCT in the last 72 hours. Urinalysis    Component Value Date/Time   COLORURINE YELLOW 08/26/2017 1446   APPEARANCEUR CLEAR 08/26/2017 1446   LABSPEC 1.010 08/26/2017 1446   PHURINE 7.0 08/26/2017 1446   GLUCOSEU NEGATIVE 08/26/2017 1446   HGBUR NEGATIVE 08/26/2017 1446   BILIRUBINUR neg 05/18/2017 1017   KETONESUR NEGATIVE 08/26/2017 1446   PROTEINUR NEGATIVE 08/26/2017 1446   UROBILINOGEN 0.2 05/18/2017 1017   NITRITE neg 05/18/2017 1017   NITRITE NEGATIVE 04/07/2017 1734   LEUKOCYTESUR Negative 05/18/2017 1017     Sepsis Labs Invalid input(s): PROCALCITONIN,  WBC,  LACTICIDVEN Microbiology Recent Results (from the past 240 hour(s))  SARS CORONAVIRUS 2 (TAT 6-24 HRS)     Status: None   Collection Time: 08/11/19 12:56 PM  Result Value Ref Range Status   SARS Coronavirus 2 NEGATIVE NEGATIVE Final     Comment: (NOTE) SARS-CoV-2 target nucleic acids are NOT DETECTED. The SARS-CoV-2 RNA is generally detectable in upper and lower respiratory specimens during the acute phase of infection. Negative results do not preclude SARS-CoV-2 infection, do not rule out co-infections with other pathogens, and should not be used as the sole basis for treatment or other patient management decisions. Negative results must be combined with clinical observations, patient history, and epidemiological information. The expected result is Negative. Fact Sheet for Patients: SugarRoll.be Fact Sheet for Healthcare Providers: https://www.woods-mathews.com/ This test is not yet approved or cleared by the [place] FDA and  has been authorized for detection and/or diagnosis of SARS-CoV-2 by FDA under an Emergency Use Authorization (EUA). This EUA will remain  in effect (meaning this test can be used) for the duration of the COVID-19 declaration under Section 56 4(b)(1) of the Act, 21 U.S.C. section 360bbb-3(b)(1), unless the authorization is terminated or revoked sooner. Performed at [facility], [place] [address], [place], [place] [address]        Inpatient Medications:   Scheduled Meds: . calcitRIOL  1 mcg Oral Daily  . calcium carbonate  4 tablet Oral TID WC  . docusate sodium  100 mg Oral BID  . enoxaparin (LOVENOX) injection  40 mg Subcutaneous Q24H  . levothyroxine  125 mcg Oral Q0600  . loratadine  10 mg Oral Daily  . pravastatin  10 mg Oral q1800  . vitamin C  500 mg Oral Daily   Continuous Infusions:   Radiological Exams on Admission: No results found.  Impression/Recommendations Principal Problem:   Papillary thyroid carcinoma ([place]) Active Problems:   Hypoparathyroidism ([place])  Post thyroidectomy hypocalcemia Despite that parathyroid tissue was preserved, transient parathyroid dysfunction is common, and primary parathyroid  dysfunction is possible post thyroidectomy.  Goal corrected calcium levels are in the range 7.5-8.5 mg/dL, glucose higher.  Calcium levels can lead to hypercalciuria and kidney stones.  Her postoperative calcium levels have actually mostly been in this range, and today they are 8.2 corrected, and her symptoms are improved.  Mag is replete.  ECG shows no arrhythmia, normal QRS duration, QTc.  Stable for discharge from the standpoint of her hypocalcemia     -Continue 2000 mg elemental calcium 3 times daily, calcitriol 1 mcg daily -Continue calcium carbonate at discharge -If patient planning to resume PPI at discharge, she should be instructed to take calcium citrate instead of calcium carbonate  -Check vitamin D 25 and if low, supplement Vit D at discharge   -At discharge, the patient should:  1.  Have endocrinology referral  2. Have repeat Ca level in 1 week and then periodically for at least 1 month or until Endo referral  3.  Be instructed to take as needed Tums for paresthesias, perioral tingling, muscle spasms  2.  Call her ENT for symptoms, so that repeat Ca can be ordered on urgent basis              Thank you for this consultation.  Our [facility] hospitalist team will follow the patient with you.     Edwin Dada M.D. [facility] 08/16/2019, 10:23 AM Please page Via [email or web address] for questions

## 2019-08-16 NOTE — Progress Notes (Signed)
Discharged home today. Personal belongings,discharged instructions given to patient. Emphasize the need to call the doctor for symptoms of low calcium level like tingling ,numbness of fingers. Advised to pick up medications called in at pharmacy of choice.Advised follow up with Dr. Blenda Nicely on Tuesday 11/24 and lab works to do on Monday before doctor's appointment. Verbalized understanding of instructions.

## 2019-08-16 NOTE — Progress Notes (Addendum)
Patient ID: Aimee Figueroa, female   DOB: [birth year], 48 y.o.   MRN: [id number] Subjective: She is doing reasonably well.  NO tingling or numbness this morning, did have some last night.   Pain controlled.  Voice is good.  Walking, ambulating regularly.   Objective: Vital signs in last 24 hours: Temp:  [98.6 F (37 C)-98.8 F (37.1 C)] 98.6 F (37 C) (11/17 [id number]) Pulse Rate:  [66-75] 66 (11/17 0633) Resp:  [18-19] 18 (11/17 0633) BP: (93-106)/(55-58) 98/55 (11/17 0633) SpO2:  [95 %-99 %] 99 % (11/17 [id number]) Weight change:  Last BM Date: 08/14/19  Intake/Output from previous day: 11/16 0701 - 11/17 0700 In: 550 [P.O.:550] Out: -  Intake/Output this shift: No intake/output data recorded.  PHYSICAL EXAM: She is awake and alert and in no distress.  Her voice is good.  Her breathing is clear.  Surgical site looks excellent.   Negative Chovstek sign on left, positive on the right (?).  Lab Results: No results for input(s): WBC, HGB, HCT, PLT in the last 72 hours. BMET Recent Labs    08/15/19 1513 08/16/19 0556  NA 141  --   K 3.7  --   CL 97*  --   CO2 29  --   GLUCOSE 104*  --   BUN 7  --   CREATININE 0.70  --   CALCIUM 7.9* 7.7*  Ca was up last night at 7.9, now back to 7.7 today.   Studies/Results: No results found.  Medications: I have reviewed the patient's current medications.  Assessment/Plan: Postop day 4 from total thyroidectomy and right selective neck dissection.  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  Surgical site looks excellent.  She is swallowing pretty well.   Calcium has dropped a little this morning but is improved from 24h prior.    Postop PTH went from 8-7.  Calcium this morning 7.7.  We will maintain her current elemental calcium, Os-Cal.  We will maintain Rocaltrol at 1. Will consult medicine for assistance in calcium management.   Continue to monitor and continue replacement.   Anticipate possible discharge  once Ca is stable and she remains asymptomatic (likely tomorrow).     LOS: 4 days   Helayne Seminole 08/16/2019, 8:05 AM

## 2019-08-16 NOTE — Discharge Summary (Signed)
Physician Discharge Summary  Patient ID: Aimee Figueroa MRN: [id number] DOB/AGE: August 05, 1971 48 y.o.  Admit date: 08/12/2019 Discharge date: 08/16/2019  Admission Diagnoses:  Discharge Diagnoses:  Principal Problem:   Papillary thyroid carcinoma ([place]) Active Problems:   Hypoparathyroidism [facility])   Discharged Condition: good  Hospital Course:  The patient underwent total thyroidectomy and right selective neck dissection on 08/19/19. A JP drain was placed. This was rmeoved on POD2.  She was admitted post operatively. Post op PTH was 8 then 7. Subsequently, her calcium trended downwards to a nadir of 6.9 on POD 3. Her magnesium was orally repleted. She had a normal EKG. Calcium and Rocaltrol were increased with an improvement in symptoms. Hospitalist was consulted 11/17 and further labs reviewed. Felt stable for discharge as her symptoms improved. On day of discharge, she now has no further tingling or numbness at lips or fingertips. She and her husband were educated on her care, handouts were provided. Endocrinology and ENT followup with labs were made at discharge.   Consults: Hospitalist 08/16/19  Significant Diagnostic Studies: labs: calcium, PTH, Vit D were followed and normal EKG was obtained 11/16.   Treatments: IV hydration, analgesia: oxycodone, surgery: total thyroidectomy, right selective neck dissection and OsCal, Rocaltrol, calcium carbonate  Discharge Exam: Blood pressure (!) 98/55, pulse 66, temperature 98.6 F (37 C), temperature source Oral, resp. rate 18, height 5' (1.524 m), weight 65.8 kg, last menstrual period 05/01/2017, SpO2 99 %. General appearance: alert, cooperative and appears stated age Head: Normocephalic, without obvious abnormality, atraumatic Eyes: conjunctivae/corneas clear. PERRL, EOM's intact. Fundi benign. Nose: Nares normal. Septum midline. Mucosa normal. No drainage or sinus tenderness. Throat: lips, mucosa, and tongue normal; teeth and  gums normal Neck: no carotid bruit, supple, symmetrical, trachea midline and midline neck incision c/d/i, soft Resp: no stridor, no dysphonia, bilateral symmetric expansion Chest wall: no tenderness GI: soft, non-tender; bowel sounds normal; no masses,  no organomegaly Extremities: extremities normal, atraumatic, no cyanosis or edema Pulses: 2+ and symmetric  Disposition: Discharge disposition: 01-Home or Self Care       Discharge Instructions    Call MD for:   Complete by: As directed    Any tingling or numbness around lips or muscle spasms that do not resolve within a few minutes of chewing tums (calcium carbonate).   Call MD for:  difficulty breathing, headache or visual disturbances   Complete by: As directed    Call MD for:  persistant nausea and vomiting   Complete by: As directed    Call MD for:  redness, tenderness, or signs of infection (pain, swelling, redness, odor or green/yellow discharge around incision site)   Complete by: As directed    Call MD for:  temperature >100.4   Complete by: As directed    Diet general   Complete by: As directed    Soft diet, advance as tolerated     Allergies as of 08/16/2019      Reactions   Tylenol [acetaminophen] Rash      Medication List    STOP taking these medications   ibuprofen 600 MG tablet Commonly known as: ADVIL   OMEGA 3 PO   omega-3 acid ethyl esters 1 g capsule Commonly known as: LOVAZA   omeprazole 40 MG capsule Commonly known as: PRILOSEC   Vitamin D3 10 MCG (400 UNIT) Caps     TAKE these medications   atorvastatin 20 MG tablet Commonly known as: LIPITOR Take 1 tablet (20 mg total) by mouth daily.  calcitRIOL 0.5 MCG capsule Commonly known as: ROCALTROL Take 2 capsules (1 mcg total) by mouth daily. Start taking on: August 17, 2019   calcium carbonate 1250 (500 Ca) MG tablet Commonly known as: OS-CAL - dosed in mg of elemental calcium Take 4 tablets (2,000 mg of elemental calcium total) by  mouth 3 (three) times daily with meals.   calcium carbonate 500 MG chewable tablet Commonly known as: Tums Chew 2-3 tablets immediately if you experience any muscle spasms or tingling around your lips. If this does not resolve, then call your doctor   cetirizine 10 MG tablet Commonly known as: ZYRTEC Take 10 mg by mouth daily. What changed: Another medication with the same name was removed. Continue taking this medication, and follow the directions you see here.   cyclobenzaprine 5 MG tablet Commonly known as: FLEXERIL Take on every 8 hours as needed for muscle spasm and up to 2 pills at bedtime   docusate sodium 100 MG capsule Commonly known as: COLACE Take 1 capsule (100 mg total) by mouth 2 (two) times daily. For constipation.   levothyroxine 125 MCG tablet Commonly known as: SYNTHROID Take 1 tablet (125 mcg total) by mouth daily at 6 (six) AM. Start taking on: August 17, 2019   Lidocaine (Anorectal) 5 % Gel Apply to affected area twice daily.   lovastatin 10 MG tablet Commonly known as: MEVACOR Take 10 mg by mouth at bedtime.   nortriptyline 25 MG capsule Commonly known as: PAMELOR Take 1 capsule (25 mg total) by mouth at bedtime.   oxyCODONE 5 MG immediate release tablet Commonly known as: Oxy IR/ROXICODONE Take 1 tablet (5 mg total) by mouth every 4 (four) hours as needed for moderate pain.   vitamin C 500 MG tablet Commonly known as: ASCORBIC ACID Take 500 mg by mouth daily.      Follow-up Information    Helayne Seminole, MD Follow up in 1 week(s).   Specialty: Otolaryngology Why: For wound re-check. Appt 11/24 at 10:40am Contact information: [address] [place] [address] [phone]           Signed: Helayne Seminole, MD 08/16/2019, 2:03 PM

## 2019-08-17 LAB — PARATHYROID HORMONE, INTACT (NO CA): PTH: 10 pg/mL — ABNORMAL LOW (ref 15–65)

## 2019-08-17 LAB — VITAMIN D 25 HYDROXY (VIT D DEFICIENCY, FRACTURES): Vit D, 25-Hydroxy: 25 ng/mL — ABNORMAL LOW (ref 30–100)

## 2019-09-07 MED FILL — NORTRIPTYLINE HCL 25 MG CAP: 25 | 30 days supply | Qty: 30 | Fill #2

## 2019-10-19 MED FILL — NORTRIPTYLINE HCL 25 MG CAP: 25 | 30 days supply | Qty: 30 | Fill #3

## 2019-12-13 ENCOUNTER — Telehealth: Payer: Self-pay | Admitting: Nurse Practitioner

## 2019-12-13 NOTE — Telephone Encounter (Signed)
I called the Pt in referent of the bills she drop up at the [place], they are collection bills and they don't have a Date of service, I can work on them with out a date of service

## 2020-06-01 ENCOUNTER — Other Ambulatory Visit: Payer: Self-pay

## 2022-08-01 ENCOUNTER — Encounter (HOSPITAL_COMMUNITY): Payer: Self-pay | Admitting: General Practice
